# Patient Record
Sex: Male | Born: 1935 | Race: White | Hispanic: No | Marital: Married | Smoking: Former smoker
Health system: Southern US, Community
[De-identification: ages and names within clinical notes are randomized; demographics above are authoritative.]

## PROBLEM LIST (undated history)

## (undated) DIAGNOSIS — I509 Heart failure, unspecified: Secondary | ICD-10-CM

## (undated) DIAGNOSIS — C439 Malignant melanoma of skin, unspecified: Secondary | ICD-10-CM

## (undated) DIAGNOSIS — C801 Malignant (primary) neoplasm, unspecified: Secondary | ICD-10-CM

## (undated) DIAGNOSIS — M1990 Unspecified osteoarthritis, unspecified site: Secondary | ICD-10-CM

## (undated) DIAGNOSIS — I722 Aneurysm of renal artery: Secondary | ICD-10-CM

## (undated) DIAGNOSIS — N200 Calculus of kidney: Secondary | ICD-10-CM

## (undated) DIAGNOSIS — F32A Depression, unspecified: Secondary | ICD-10-CM

## (undated) DIAGNOSIS — N400 Enlarged prostate without lower urinary tract symptoms: Secondary | ICD-10-CM

## (undated) DIAGNOSIS — R0600 Dyspnea, unspecified: Secondary | ICD-10-CM

## (undated) DIAGNOSIS — E7800 Pure hypercholesterolemia, unspecified: Secondary | ICD-10-CM

## (undated) DIAGNOSIS — F329 Major depressive disorder, single episode, unspecified: Secondary | ICD-10-CM

## (undated) DIAGNOSIS — I714 Abdominal aortic aneurysm, without rupture, unspecified: Secondary | ICD-10-CM

## (undated) DIAGNOSIS — J189 Pneumonia, unspecified organism: Secondary | ICD-10-CM

## (undated) DIAGNOSIS — Z8719 Personal history of other diseases of the digestive system: Secondary | ICD-10-CM

## (undated) DIAGNOSIS — I10 Essential (primary) hypertension: Secondary | ICD-10-CM

## (undated) DIAGNOSIS — T7840XA Allergy, unspecified, initial encounter: Secondary | ICD-10-CM

## (undated) DIAGNOSIS — F419 Anxiety disorder, unspecified: Secondary | ICD-10-CM

## (undated) DIAGNOSIS — J449 Chronic obstructive pulmonary disease, unspecified: Secondary | ICD-10-CM

## (undated) DIAGNOSIS — K219 Gastro-esophageal reflux disease without esophagitis: Secondary | ICD-10-CM

## (undated) HISTORY — DX: Heart failure, unspecified: I50.9

## (undated) HISTORY — PX: ABDOMINAL AORTIC ANEURYSM REPAIR: SUR1152

## (undated) HISTORY — PX: HERNIA REPAIR: SHX51

## (undated) HISTORY — PX: EYE SURGERY: SHX253

## (undated) HISTORY — PX: KIDNEY STONE SURGERY: SHX686

## (undated) HISTORY — PX: BACK SURGERY: SHX140

---

## 2002-07-26 ENCOUNTER — Ambulatory Visit (HOSPITAL_COMMUNITY): Admission: RE | Admit: 2002-07-26 | Discharge: 2002-07-26 | Payer: Self-pay | Admitting: Neurosurgery

## 2002-12-08 ENCOUNTER — Inpatient Hospital Stay (HOSPITAL_COMMUNITY): Admission: RE | Admit: 2002-12-08 | Discharge: 2002-12-09 | Payer: Self-pay | Admitting: Neurosurgery

## 2003-09-08 ENCOUNTER — Ambulatory Visit (HOSPITAL_COMMUNITY): Admission: RE | Admit: 2003-09-08 | Discharge: 2003-09-08 | Payer: Self-pay | Admitting: Neurosurgery

## 2004-08-30 ENCOUNTER — Other Ambulatory Visit: Payer: Self-pay

## 2006-03-17 ENCOUNTER — Ambulatory Visit: Payer: Self-pay | Admitting: Gastroenterology

## 2006-06-04 ENCOUNTER — Emergency Department: Payer: Self-pay | Admitting: Emergency Medicine

## 2006-06-04 ENCOUNTER — Other Ambulatory Visit: Payer: Self-pay

## 2007-06-02 ENCOUNTER — Ambulatory Visit: Payer: Self-pay | Admitting: Family Medicine

## 2007-07-16 ENCOUNTER — Ambulatory Visit: Payer: Self-pay | Admitting: Family Medicine

## 2008-03-29 ENCOUNTER — Ambulatory Visit: Payer: Self-pay | Admitting: Family Medicine

## 2008-08-08 ENCOUNTER — Other Ambulatory Visit: Payer: Self-pay

## 2008-08-08 ENCOUNTER — Observation Stay: Payer: Self-pay | Admitting: Internal Medicine

## 2008-12-25 ENCOUNTER — Ambulatory Visit: Payer: Self-pay | Admitting: Unknown Physician Specialty

## 2009-02-07 ENCOUNTER — Encounter: Payer: Self-pay | Admitting: Neurology

## 2009-02-19 ENCOUNTER — Encounter: Payer: Self-pay | Admitting: Neurology

## 2009-09-05 ENCOUNTER — Ambulatory Visit: Payer: Self-pay | Admitting: Gastroenterology

## 2009-11-09 ENCOUNTER — Ambulatory Visit: Payer: Self-pay | Admitting: Gastroenterology

## 2009-11-19 ENCOUNTER — Ambulatory Visit: Payer: Self-pay | Admitting: Gastroenterology

## 2009-12-22 ENCOUNTER — Ambulatory Visit: Payer: Self-pay | Admitting: Internal Medicine

## 2010-01-04 ENCOUNTER — Ambulatory Visit: Payer: Self-pay | Admitting: Internal Medicine

## 2010-01-22 ENCOUNTER — Ambulatory Visit: Payer: Self-pay | Admitting: Internal Medicine

## 2010-02-19 ENCOUNTER — Ambulatory Visit: Payer: Self-pay | Admitting: Internal Medicine

## 2010-03-22 ENCOUNTER — Ambulatory Visit: Payer: Self-pay | Admitting: Internal Medicine

## 2010-04-11 ENCOUNTER — Ambulatory Visit: Payer: Self-pay | Admitting: Internal Medicine

## 2010-04-21 ENCOUNTER — Ambulatory Visit: Payer: Self-pay | Admitting: Internal Medicine

## 2010-06-03 ENCOUNTER — Ambulatory Visit: Payer: Self-pay | Admitting: Gastroenterology

## 2010-06-03 ENCOUNTER — Inpatient Hospital Stay: Payer: Self-pay | Admitting: Vascular Surgery

## 2010-11-28 ENCOUNTER — Ambulatory Visit: Payer: Self-pay | Admitting: Gastroenterology

## 2011-01-17 ENCOUNTER — Emergency Department: Payer: Self-pay | Admitting: Emergency Medicine

## 2011-05-23 ENCOUNTER — Ambulatory Visit: Payer: Self-pay | Admitting: Neurology

## 2011-06-10 ENCOUNTER — Ambulatory Visit: Payer: Self-pay | Admitting: Internal Medicine

## 2011-06-20 ENCOUNTER — Ambulatory Visit: Payer: Self-pay | Admitting: Internal Medicine

## 2011-06-23 ENCOUNTER — Ambulatory Visit: Payer: Self-pay | Admitting: Internal Medicine

## 2011-07-23 ENCOUNTER — Ambulatory Visit: Payer: Self-pay | Admitting: Internal Medicine

## 2011-08-27 ENCOUNTER — Ambulatory Visit: Payer: Self-pay | Admitting: Internal Medicine

## 2011-09-22 ENCOUNTER — Ambulatory Visit: Payer: Self-pay | Admitting: Internal Medicine

## 2011-11-20 ENCOUNTER — Other Ambulatory Visit: Payer: Self-pay | Admitting: Internal Medicine

## 2011-11-20 MED ORDER — HYDRALAZINE HCL 100 MG PO TABS: 100.0000 mg | ORAL_TABLET | Freq: Three times a day (TID) | ORAL | Status: DC

## 2011-11-27 ENCOUNTER — Other Ambulatory Visit: Payer: Self-pay | Admitting: Neurosurgery

## 2011-12-03 ENCOUNTER — Inpatient Hospital Stay (HOSPITAL_COMMUNITY): Admission: RE | Admit: 2011-12-03 | Payer: Medicare Other | Source: Ambulatory Visit

## 2011-12-08 ENCOUNTER — Encounter (HOSPITAL_COMMUNITY): Payer: Self-pay

## 2011-12-08 ENCOUNTER — Encounter (HOSPITAL_COMMUNITY)
Admission: RE | Admit: 2011-12-08 | Discharge: 2011-12-08 | Disposition: A | Discharge status: Home / Self Care | Payer: Medicare Other | Source: Ambulatory Visit | Attending: Neurosurgery | Admitting: Neurosurgery

## 2011-12-08 ENCOUNTER — Encounter (HOSPITAL_COMMUNITY): Payer: Self-pay | Admitting: Pharmacy Technician

## 2011-12-08 ENCOUNTER — Other Ambulatory Visit: Payer: Self-pay

## 2011-12-08 HISTORY — DX: Personal history of other diseases of the digestive system: Z87.19

## 2011-12-08 HISTORY — DX: Essential (primary) hypertension: I10

## 2011-12-08 HISTORY — DX: Gastro-esophageal reflux disease without esophagitis: K21.9

## 2011-12-08 HISTORY — DX: Unspecified osteoarthritis, unspecified site: M19.90

## 2011-12-08 HISTORY — DX: Malignant (primary) neoplasm, unspecified: C80.1

## 2011-12-08 HISTORY — DX: Benign prostatic hyperplasia without lower urinary tract symptoms: N40.0

## 2011-12-08 HISTORY — DX: Pure hypercholesterolemia, unspecified: E78.00

## 2011-12-08 LAB — BASIC METABOLIC PANEL
GFR calc Af Amer: 47 mL/min — ABNORMAL LOW (ref >90)
GFR calc non Af Amer: 41 mL/min — ABNORMAL LOW (ref >90)
Potassium: 6.1 mEq/L — ABNORMAL HIGH (ref 3.5–5.1)
Sodium: 137 mEq/L (ref 135–145)

## 2011-12-08 LAB — CBC
MCHC: 33.3 g/dL (ref 30.0–36.0)
RDW: 13.6 % (ref 11.5–15.5)
WBC: 9 10*3/uL (ref 4.0–10.5)

## 2011-12-08 LAB — SURGICAL PCR SCREEN
MRSA, PCR: NEGATIVE
Staphylococcus aureus: NEGATIVE

## 2011-12-08 NOTE — Progress Notes (Signed)
[facility]. Called for past stress test  "yrs ago". No current ekg

## 2011-12-08 NOTE — Pre-Procedure Instructions (Signed)
20 Gary Chang  12/08/2011   Your procedure is scheduled on:  12/10/11  Report to [facility] at 1045 AM.  Call this number if you have problems the morning of surgery: [phone]   Remember:   Do not eat food:After Midnight.  May have clear liquids: up to 4 Hours before arrival.  Clear liquids include soda, tea, black coffee, apple or grape juice, broth.  Take these medicines the morning of surgery with A SIP OF WATER:apresoline  Do not wear jewelry, make-up or nail polish.  Do not wear lotions, powders, or perfumes. You may wear deodorant.  Do not shave 48 hours prior to surgery.  Do not bring valuables to the hospital.  Contacts, dentures or bridgework may not be worn into surgery.  Leave suitcase in the car. After surgery it may be brought to your room.  For patients admitted to the hospital, checkout time is 11:00 AM the day of discharge.   Patients discharged the day of surgery will not be allowed to drive home.  Name and phone number of your driver: family  Special Instructions: CHG Shower Use Special Wash: 1/2 bottle night before surgery and 1/2 bottle morning of surgery.   Please read over the following fact sheets that you were given: Pain Booklet, MRSA Information and Surgical Site Infection Prevention

## 2011-12-08 NOTE — Pre-Procedure Instructions (Signed)
20 Gary Chang  12/08/2011   Your procedure is scheduled on:  12/10/11  Report to [facility] at 1045 AM.  Call this number if you have problems the morning of surgery: [phone]   Remember:   Do not eat food:After Midnight.  May have clear liquids: up to 4 Hours before arrival  Clear liquids include soda, tea, black coffee, apple or grape juice, broth.  Take these medicines the morning of surgery with A SIP OF WATER: apresoline,hydocodone,alprazolam,metroprolol   Do not wear jewelry, make-up or nail polish.  Do not wear lotions, powders, or perfumes. You may wear deodorant.  Do not shave 48 hours prior to surgery.  Do not bring valuables to the hospital.  Contacts, dentures or bridgework may not be worn into surgery.  Leave suitcase in the car. After surgery it may be brought to your room.  For patients admitted to the hospital, checkout time is 11:00 AM the day of discharge.   Patients discharged the day of surgery will not be allowed to drive home.  Name and phone number of your driver: family  Special Instructions: CHG Shower Use Special Wash: 1/2 bottle night before surgery and 1/2 bottle morning of surgery.   Please read over the following fact sheets that you were given: Pain Booklet, MRSA Information and Surgical Site Infection Prevention

## 2011-12-09 MED ORDER — CEFAZOLIN SODIUM-DEXTROSE 2-3 GM-% IV SOLR
2.0000 g | INTRAVENOUS | Status: DC
  Filled 2011-12-09: qty 50

## 2011-12-09 NOTE — Progress Notes (Signed)
No stress test available from [facility]. Unsure of when stress test was done

## 2011-12-09 NOTE — Progress Notes (Signed)
Consent verbage verified with Darl Pikes at Dr. Lovell Sheehan office

## 2011-12-10 ENCOUNTER — Inpatient Hospital Stay (HOSPITAL_COMMUNITY): Payer: Medicare Other | Admitting: *Deleted

## 2011-12-10 ENCOUNTER — Ambulatory Visit (HOSPITAL_COMMUNITY)
Admission: RE | Admit: 2011-12-10 | Discharge: 2011-12-11 | DRG: 491 | Disposition: A | Discharge status: Home / Self Care | Payer: Medicare Other | Source: Ambulatory Visit | Attending: Neurosurgery | Admitting: Neurosurgery

## 2011-12-10 ENCOUNTER — Encounter (HOSPITAL_COMMUNITY): Payer: Self-pay

## 2011-12-10 ENCOUNTER — Encounter (HOSPITAL_COMMUNITY)
Admission: RE | Disposition: A | Discharge status: Home / Self Care | Payer: Self-pay | Source: Ambulatory Visit | Attending: Neurosurgery

## 2011-12-10 ENCOUNTER — Encounter (HOSPITAL_COMMUNITY): Payer: Self-pay | Admitting: *Deleted

## 2011-12-10 ENCOUNTER — Inpatient Hospital Stay (HOSPITAL_COMMUNITY): Payer: Medicare Other

## 2011-12-10 DIAGNOSIS — M4714 Other spondylosis with myelopathy, thoracic region: Secondary | ICD-10-CM | POA: Insufficient documentation

## 2011-12-10 DIAGNOSIS — I10 Essential (primary) hypertension: Secondary | ICD-10-CM | POA: Insufficient documentation

## 2011-12-10 DIAGNOSIS — M4804 Spinal stenosis, thoracic region: Secondary | ICD-10-CM | POA: Insufficient documentation

## 2011-12-10 DIAGNOSIS — Z01818 Encounter for other preprocedural examination: Secondary | ICD-10-CM | POA: Insufficient documentation

## 2011-12-10 DIAGNOSIS — R269 Unspecified abnormalities of gait and mobility: Secondary | ICD-10-CM | POA: Insufficient documentation

## 2011-12-10 DIAGNOSIS — K219 Gastro-esophageal reflux disease without esophagitis: Secondary | ICD-10-CM | POA: Insufficient documentation

## 2011-12-10 DIAGNOSIS — Z01812 Encounter for preprocedural laboratory examination: Secondary | ICD-10-CM | POA: Insufficient documentation

## 2011-12-10 DIAGNOSIS — Z01810 Encounter for preprocedural cardiovascular examination: Secondary | ICD-10-CM | POA: Insufficient documentation

## 2011-12-10 DIAGNOSIS — E119 Type 2 diabetes mellitus without complications: Secondary | ICD-10-CM | POA: Insufficient documentation

## 2011-12-10 HISTORY — PX: LUMBAR LAMINECTOMY/DECOMPRESSION MICRODISCECTOMY: SHX5026

## 2011-12-10 LAB — GLUCOSE, CAPILLARY: Glucose-Capillary: 121 mg/dL — ABNORMAL HIGH (ref 70–99)

## 2011-12-10 LAB — POCT I-STAT 4, (NA,K, GLUC, HGB,HCT)
Glucose, Bld: 138 mg/dL — ABNORMAL HIGH (ref 70–99)
Potassium: 4.4 mEq/L (ref 3.5–5.1)

## 2011-12-10 SURGERY — LUMBAR LAMINECTOMY/DECOMPRESSION MICRODISCECTOMY
Anesthesia: General | Site: Spine Thoracic | Wound class: Clean

## 2011-12-10 MED ORDER — LOSARTAN POTASSIUM 50 MG PO TABS
100.0000 mg | ORAL_TABLET | ORAL | Status: DC
  Filled 2011-12-10 (×2): qty 2

## 2011-12-10 MED ORDER — HYDRALAZINE HCL 50 MG PO TABS
100.0000 mg | ORAL_TABLET | Freq: Three times a day (TID) | ORAL | Status: DC
  Administered 2011-12-10 – 2011-12-11 (×2): 100 mg via ORAL
  Filled 2011-12-10 (×4): qty 2

## 2011-12-10 MED ORDER — EPHEDRINE SULFATE 50 MG/ML IJ SOLN
INTRAMUSCULAR | Status: DC | PRN
  Administered 2011-12-10 (×3): 10 mg via INTRAVENOUS

## 2011-12-10 MED ORDER — SODIUM CHLORIDE 0.9 % IV SOLN
INTRAVENOUS | Status: AC
  Filled 2011-12-10: qty 500

## 2011-12-10 MED ORDER — ONDANSETRON HCL 4 MG/2ML IJ SOLN
INTRAMUSCULAR | Status: DC | PRN
  Administered 2011-12-10: 4 mg via INTRAVENOUS

## 2011-12-10 MED ORDER — BUPIVACAINE-EPINEPHRINE PF 0.5-1:200000 % IJ SOLN
INTRAMUSCULAR | Status: DC | PRN
  Administered 2011-12-10: 10 mL

## 2011-12-10 MED ORDER — OXYCODONE-ACETAMINOPHEN 5-325 MG PO TABS: 1.0000 | ORAL_TABLET | ORAL | Status: DC | PRN

## 2011-12-10 MED ORDER — NEOSTIGMINE METHYLSULFATE 1 MG/ML IJ SOLN
INTRAMUSCULAR | Status: DC | PRN
  Administered 2011-12-10: 3 mg via INTRAVENOUS

## 2011-12-10 MED ORDER — POTASSIUM CHLORIDE CRYS ER 20 MEQ PO TBCR
20.0000 meq | EXTENDED_RELEASE_TABLET | ORAL | Status: DC
  Filled 2011-12-10 (×2): qty 1

## 2011-12-10 MED ORDER — DOCUSATE SODIUM 100 MG PO CAPS
100.0000 mg | ORAL_CAPSULE | Freq: Two times a day (BID) | ORAL | Status: DC
  Administered 2011-12-10 – 2011-12-11 (×2): 100 mg via ORAL
  Filled 2011-12-10 (×2): qty 1

## 2011-12-10 MED ORDER — PHENOL 1.4 % MT LIQD: 1.0000 | OROMUCOSAL | Status: DC | PRN

## 2011-12-10 MED ORDER — ONDANSETRON HCL 4 MG/2ML IJ SOLN: 4.0000 mg | Freq: Once | INTRAMUSCULAR | Status: DC | PRN

## 2011-12-10 MED ORDER — MENTHOL 3 MG MT LOZG: 1.0000 | LOZENGE | OROMUCOSAL | Status: DC | PRN

## 2011-12-10 MED ORDER — LACTATED RINGERS IV SOLN: INTRAVENOUS | Status: DC

## 2011-12-10 MED ORDER — MEPERIDINE HCL 25 MG/ML IJ SOLN: 6.2500 mg | INTRAMUSCULAR | Status: DC | PRN

## 2011-12-10 MED ORDER — BUPIVACAINE LIPOSOME 1.3 % IJ SUSP
20.0000 mL | Freq: Once | INTRAMUSCULAR | Status: DC
  Filled 2011-12-10: qty 20

## 2011-12-10 MED ORDER — DIAZEPAM 5 MG PO TABS: 5.0000 mg | ORAL_TABLET | Freq: Four times a day (QID) | ORAL | Status: DC | PRN

## 2011-12-10 MED ORDER — CITALOPRAM HYDROBROMIDE 20 MG PO TABS: 1.5000 mg | ORAL_TABLET | Freq: Every day | ORAL | Status: DC

## 2011-12-10 MED ORDER — HYDROMORPHONE HCL PF 1 MG/ML IJ SOLN
INTRAMUSCULAR | Status: AC
  Filled 2011-12-10: qty 1

## 2011-12-10 MED ORDER — ZOLPIDEM TARTRATE 5 MG PO TABS: 10.0000 mg | ORAL_TABLET | Freq: Every evening | ORAL | Status: DC | PRN

## 2011-12-10 MED ORDER — BACITRACIN 50000 UNITS IM SOLR
INTRAMUSCULAR | Status: AC
  Filled 2011-12-10: qty 50000

## 2011-12-10 MED ORDER — ROCURONIUM BROMIDE 100 MG/10ML IV SOLN
INTRAVENOUS | Status: DC | PRN
  Administered 2011-12-10: 50 mg via INTRAVENOUS

## 2011-12-10 MED ORDER — ACETAMINOPHEN 325 MG PO TABS: 650.0000 mg | ORAL_TABLET | ORAL | Status: DC | PRN

## 2011-12-10 MED ORDER — PROPOFOL 10 MG/ML IV EMUL
INTRAVENOUS | Status: DC | PRN
  Administered 2011-12-10: 140 mg via INTRAVENOUS

## 2011-12-10 MED ORDER — MORPHINE SULFATE 4 MG/ML IJ SOLN: 1.0000 mg | INTRAMUSCULAR | Status: DC | PRN

## 2011-12-10 MED ORDER — SODIUM CHLORIDE 0.9 % IJ SOLN
3.0000 mL | Freq: Two times a day (BID) | INTRAMUSCULAR | Status: DC
  Administered 2011-12-10: 3 mL via INTRAVENOUS

## 2011-12-10 MED ORDER — BUPIVACAINE LIPOSOME 1.3 % IJ SUSP
INTRAMUSCULAR | Status: DC | PRN
  Administered 2011-12-10: 20 mL

## 2011-12-10 MED ORDER — INSULIN ASPART 100 UNIT/ML ~~LOC~~ SOLN
0.0000 [IU] | Freq: Three times a day (TID) | SUBCUTANEOUS | Status: DC
  Administered 2011-12-11: 2 [IU] via SUBCUTANEOUS
  Filled 2011-12-10: qty 3

## 2011-12-10 MED ORDER — ONDANSETRON HCL 4 MG/2ML IJ SOLN: 4.0000 mg | INTRAMUSCULAR | Status: DC | PRN

## 2011-12-10 MED ORDER — LACTATED RINGERS IV SOLN
INTRAVENOUS | Status: DC | PRN
  Administered 2011-12-10 (×2): via INTRAVENOUS

## 2011-12-10 MED ORDER — CEFAZOLIN SODIUM 1-5 GM-% IV SOLN
INTRAVENOUS | Status: DC | PRN
  Administered 2011-12-10: 2 g via INTRAVENOUS

## 2011-12-10 MED ORDER — FENTANYL CITRATE 0.05 MG/ML IJ SOLN
INTRAMUSCULAR | Status: DC | PRN
  Administered 2011-12-10 (×2): 100 ug via INTRAVENOUS
  Administered 2011-12-10: 50 ug via INTRAVENOUS

## 2011-12-10 MED ORDER — AMLODIPINE BESYLATE 10 MG PO TABS
10.0000 mg | ORAL_TABLET | Freq: Every day | ORAL | Status: DC
  Administered 2011-12-10: 10 mg via ORAL
  Filled 2011-12-10 (×2): qty 1

## 2011-12-10 MED ORDER — ALPRAZOLAM 0.25 MG PO TABS: 0.2500 mg | ORAL_TABLET | Freq: Every evening | ORAL | Status: DC | PRN

## 2011-12-10 MED ORDER — PREGABALIN 50 MG PO CAPS
100.0000 mg | ORAL_CAPSULE | Freq: Two times a day (BID) | ORAL | Status: DC
  Administered 2011-12-10 – 2011-12-11 (×2): 100 mg via ORAL
  Filled 2011-12-10 (×2): qty 2

## 2011-12-10 MED ORDER — INSULIN ASPART 100 UNIT/ML ~~LOC~~ SOLN: 0.0000 [IU] | Freq: Every day | SUBCUTANEOUS | Status: DC

## 2011-12-10 MED ORDER — MORPHINE SULFATE 2 MG/ML IJ SOLN: 0.0500 mg/kg | INTRAMUSCULAR | Status: DC | PRN

## 2011-12-10 MED ORDER — GLIPIZIDE ER 2.5 MG PO TB24
2.5000 mg | ORAL_TABLET | Freq: Every day | ORAL | Status: DC
  Administered 2011-12-10: 2.5 mg via ORAL
  Filled 2011-12-10 (×2): qty 1

## 2011-12-10 MED ORDER — CITALOPRAM HYDROBROMIDE 20 MG PO TABS
30.0000 mg | ORAL_TABLET | Freq: Every day | ORAL | Status: DC
  Administered 2011-12-10: 30 mg via ORAL
  Filled 2011-12-10 (×2): qty 1

## 2011-12-10 MED ORDER — SODIUM CHLORIDE 0.9 % IJ SOLN: 3.0000 mL | INTRAMUSCULAR | Status: DC | PRN

## 2011-12-10 MED ORDER — HYDROMORPHONE HCL PF 1 MG/ML IJ SOLN
0.2500 mg | INTRAMUSCULAR | Status: DC | PRN
Start: 2011-12-10 — End: 2011-12-10
  Administered 2011-12-10 (×2): 0.5 mg via INTRAVENOUS

## 2011-12-10 MED ORDER — ACETAMINOPHEN 650 MG RE SUPP: 650.0000 mg | RECTAL | Status: DC | PRN

## 2011-12-10 MED ORDER — METOPROLOL SUCCINATE ER 25 MG PO TB24
25.0000 mg | ORAL_TABLET | Freq: Every day | ORAL | Status: DC
  Filled 2011-12-10 (×2): qty 1

## 2011-12-10 MED ORDER — HYDROCODONE-ACETAMINOPHEN 5-325 MG PO TABS
1.0000 | ORAL_TABLET | ORAL | Status: DC | PRN
  Administered 2011-12-10 – 2011-12-11 (×4): 1 via ORAL
  Filled 2011-12-10 (×4): qty 1

## 2011-12-10 MED ORDER — GLYCOPYRROLATE 0.2 MG/ML IJ SOLN
INTRAMUSCULAR | Status: DC | PRN
  Administered 2011-12-10: .4 mg via INTRAVENOUS

## 2011-12-10 MED ORDER — CEFAZOLIN SODIUM 1-5 GM-% IV SOLN
1.0000 g | Freq: Three times a day (TID) | INTRAVENOUS | Status: AC
  Administered 2011-12-10 – 2011-12-11 (×2): 1 g via INTRAVENOUS
  Filled 2011-12-10 (×2): qty 50

## 2011-12-10 MED ORDER — MIDAZOLAM HCL 5 MG/5ML IJ SOLN
INTRAMUSCULAR | Status: DC | PRN
  Administered 2011-12-10: 2 mg via INTRAVENOUS

## 2011-12-10 SURGICAL SUPPLY — 57 items
BAG DECANTER FOR FLEXI CONT (MISCELLANEOUS) ×2 IMPLANT
BENZOIN TINCTURE PRP APPL 2/3 (GAUZE/BANDAGES/DRESSINGS) ×2 IMPLANT
BIT DRILL NEURO 2X3.1 SFT TUCH (MISCELLANEOUS) ×1 IMPLANT
BLADE SURG ROTATE 9660 (MISCELLANEOUS) IMPLANT
BRUSH SCRUB EZ PLAIN DRY (MISCELLANEOUS) ×2 IMPLANT
BUR ACORN 6.0 (BURR) ×2 IMPLANT
BUR MATCHSTICK NEURO 3.0 LAGG (BURR) ×2 IMPLANT
CANISTER SUCTION 2500CC (MISCELLANEOUS) ×2 IMPLANT
CLOTH BEACON ORANGE TIMEOUT ST (SAFETY) ×2 IMPLANT
CONT SPEC 4OZ CLIKSEAL STRL BL (MISCELLANEOUS) ×2 IMPLANT
DRAPE LAPAROTOMY 100X72X124 (DRAPES) ×2 IMPLANT
DRAPE MICROSCOPE LEICA (MISCELLANEOUS) ×4 IMPLANT
DRAPE POUCH INSTRU U-SHP 10X18 (DRAPES) ×2 IMPLANT
DRAPE PROXIMA HALF (DRAPES) ×2 IMPLANT
DRAPE SURG 17X23 STRL (DRAPES) ×8 IMPLANT
DRILL NEURO 2X3.1 SOFT TOUCH (MISCELLANEOUS) ×2
ELECT BLADE 4.0 EZ CLEAN MEGAD (MISCELLANEOUS) ×2
ELECT REM PT RETURN 9FT ADLT (ELECTROSURGICAL) ×2
ELECTRODE BLDE 4.0 EZ CLN MEGD (MISCELLANEOUS) ×1 IMPLANT
ELECTRODE REM PT RTRN 9FT ADLT (ELECTROSURGICAL) ×1 IMPLANT
GAUZE SPONGE 4X4 16PLY XRAY LF (GAUZE/BANDAGES/DRESSINGS) IMPLANT
GLOVE BIO SURGEON STRL SZ8.5 (GLOVE) ×2 IMPLANT
GLOVE BIOGEL M 8.0 STRL (GLOVE) ×2 IMPLANT
GLOVE BIOGEL PI IND STRL 7.0 (GLOVE) ×1 IMPLANT
GLOVE BIOGEL PI INDICATOR 7.0 (GLOVE) ×1
GLOVE EXAM NITRILE LRG STRL (GLOVE) IMPLANT
GLOVE EXAM NITRILE MD LF STRL (GLOVE) IMPLANT
GLOVE EXAM NITRILE XL STR (GLOVE) IMPLANT
GLOVE EXAM NITRILE XS STR PU (GLOVE) IMPLANT
GLOVE SS BIOGEL STRL SZ 8 (GLOVE) ×1 IMPLANT
GLOVE SUPERSENSE BIOGEL SZ 8 (GLOVE) ×1
GLOVE SURG SS PI 6.5 STRL IVOR (GLOVE) ×4 IMPLANT
GOWN BRE IMP SLV AUR LG STRL (GOWN DISPOSABLE) ×2 IMPLANT
GOWN BRE IMP SLV AUR XL STRL (GOWN DISPOSABLE) ×6 IMPLANT
GOWN STRL REIN 2XL LVL4 (GOWN DISPOSABLE) IMPLANT
KIT BASIN OR (CUSTOM PROCEDURE TRAY) ×2 IMPLANT
KIT ROOM TURNOVER OR (KITS) ×2 IMPLANT
MAYFIELD SKULL PINS ×2 IMPLANT
NEEDLE HYPO 21X1.5 SAFETY (NEEDLE) IMPLANT
NEEDLE HYPO 22GX1.5 SAFETY (NEEDLE) ×2 IMPLANT
NS IRRIG 1000ML POUR BTL (IV SOLUTION) ×2 IMPLANT
PACK LAMINECTOMY NEURO (CUSTOM PROCEDURE TRAY) ×2 IMPLANT
PAD ARMBOARD 7.5X6 YLW CONV (MISCELLANEOUS) ×6 IMPLANT
PATTIES SURGICAL .5 X1 (DISPOSABLE) IMPLANT
PIN MAYFIELD SKULL DISP (PIN) ×2 IMPLANT
RUBBERBAND STERILE (MISCELLANEOUS) ×8 IMPLANT
SPONGE GAUZE 4X4 12PLY (GAUZE/BANDAGES/DRESSINGS) ×2 IMPLANT
SPONGE SURGIFOAM ABS GEL SZ50 (HEMOSTASIS) ×2 IMPLANT
STRIP CLOSURE SKIN 1/2X4 (GAUZE/BANDAGES/DRESSINGS) ×2 IMPLANT
SUT VIC AB 1 CT1 18XBRD ANBCTR (SUTURE) ×2 IMPLANT
SUT VIC AB 1 CT1 8-18 (SUTURE) ×2
SUT VIC AB 2-0 CP2 18 (SUTURE) ×4 IMPLANT
SYR 20CC LL (SYRINGE) ×2 IMPLANT
SYR 20ML ECCENTRIC (SYRINGE) ×2 IMPLANT
TOWEL OR 17X24 6PK STRL BLUE (TOWEL DISPOSABLE) ×2 IMPLANT
TOWEL OR 17X26 10 PK STRL BLUE (TOWEL DISPOSABLE) ×2 IMPLANT
WATER STERILE IRR 1000ML POUR (IV SOLUTION) ×2 IMPLANT

## 2011-12-10 NOTE — Op Note (Signed)
Brief history: The patient is an 77 year old white male who has presented with a thoracic myelopathy. He was worked up with a cervical MRI which demonstrated significant stenosis at T1-2 and T2-3. I discussed the situation with the patient and his wife. We discussed the various treatment options including surgery. The patient has weighed the risks, benefits, and alternatives to surgery decided proceed with a thoracic laminectomy.  Preoperative diagnosis: T1-2 and T2-3 spinal stenosis, thoracic myelopathy, thoracic pain.  Postoperative diagnosis: The same  Procedure: T1-2 and T2-3 laminectomy using microdissection  Surgeon: Dr. Delma Officer  Asst.: Dr. Hilda Lias  Anesthesia: Gen. endotracheal  Estimated blood loss: 100 cc  Drains: None  Complications: None  Description of procedure: The patient was brought to the operating room by the anesthesia team. General endotracheal anesthesia was induced. The patient's calvarium was supported in a [place] 3 point headrest. The patient was turned to the prone position on the chest rolls. The patient's cervicothoracic region was then prepared with Betadine scrub and Betadine solution. Sterile drapes were applied.  I then injected the area to be incised with Marcaine with epinephrine solution. I then used a scalpel to make a linear midline incision over the T1-2 and T2-3 intervertebral disc space. I then used electrocautery to perform a bilateralsubperiosteal dissection exposing the spinous process and lamina from approximately C5 to T3 . We obtained intraoperative radiograph to confirm our location. I then inserted the [facility] retractor for exposure. I used Leksell rongeurs to remove the spinous process of T1 and T2.  We then brought the operative microscope into the field. Under its magnification and illumination we completed the microdissection. I used a high-speed drill to perform a laminotomy at T1 and T2.. I then used a Kerrison punches to  widen the laminotomy and removed the ligamentum flavum at C7-T1, T1-2, and T2-3 decompressing the thecal sac at these levels.   We then obtained hemostasis using bipolar electrocautery. We irrigated the wound out with bacitracin solution. We then removed the retractor. We then reapproximated the patient's cervicothoracic fascia with interrupted #1 Vicryl suture. We then reapproximated the patient's subcutaneous tissue with interrupted 2-0 Vicrylsuture. We then reapproximated patient's skin with Steri-Strips and benzoin. The was then coated with bacitracin ointment. The drapes were removed. The patient was subsequently returned to the supine position where they were extubated by the anesthesia team and I removed the [place] 3 point headrest. The patient was then transported to the postanesthesia care unit in stable condition. All sponge instrument and needle counts were correct at the end of this case.

## 2011-12-10 NOTE — Anesthesia Postprocedure Evaluation (Signed)
  Anesthesia Post-op Note  Patient: Gary Chang  Procedure(s) Performed:  LUMBAR LAMINECTOMY/DECOMPRESSION MICRODISCECTOMY - Thoracic One-Two Laminectomy  Patient Location: PACU  Anesthesia Type: General  Level of Consciousness: awake and alert   Airway and Oxygen Therapy: Patient Spontanous Breathing and Patient connected to nasal cannula oxygen  Post-op Pain: mild  Post-op Assessment: Post-op Vital signs reviewed, Patient's Cardiovascular Status Stable, Respiratory Function Stable, Patent Airway, No signs of Nausea or vomiting and Pain level controlled  Post-op Vital Signs: Reviewed and stable  Complications: No apparent anesthesia complications

## 2011-12-10 NOTE — Progress Notes (Signed)
Spoke with Ramon Dredge, reported BMET, new order released for I-Stat

## 2011-12-10 NOTE — Preoperative (Addendum)
Beta Blockers   Reason not to administer Beta Blockers:Not Applicable    Took metoprolol 12/19 1100

## 2011-12-10 NOTE — Progress Notes (Signed)
  Subjective:  The patient is alert and pleasant. He looks well. He has no complaints.  Objective: Vital signs in last 24 hours: Temp:  [97.7 F (36.5 C)-97.9 F (36.6 C)] 97.7 F (36.5 C) (12/19 1715) Pulse Rate:  [67] 67  (12/19 1048) Resp:  [18] 18  (12/19 1048) BP: (135-144)/(62-78) 144/62 mmHg (12/19 1715) SpO2:  [97 %] 97 % (12/19 1048)  Intake/Output from previous day:   Intake/Output this shift: Total I/O In: 1900 [I.V.:1900] Out: 100 [Blood:100]  Physical exam the patient is moving his lower extremities well. His dressing is clean and dry.  Lab Results:  Basename 12/10/11 1101 12/08/11 1546  WBC -- 9.0  HGB 16.0 15.6  HCT 47.0 46.8  PLT -- 185   BMET  Basename 12/10/11 1101 12/08/11 1546  NA 140 137  K 4.4 6.1*  CL -- 103  CO2 -- 28  GLUCOSE 138* 79  BUN -- 18  CREATININE -- 1.54*  CALCIUM -- 9.3    Studies/Results: Dg Thoracic Spine 1 View  12/10/2011  *RADIOLOGY REPORT*  Clinical Data: T1-2 laminectomy  THORACIC SPINE - 1 VIEW  Comparison: None.  Findings: Lateral view of the cervical spine.  Limited visualization of C5 and below.  Surgical instrument overlies the spinous process of C5 and projects over the spinal canal posterior to the C5-6 level.  IMPRESSION: C5-6 localized from a posterior surgical approach.  Original Report Authenticated By: Camelia Phenes, M.D.   Dg Thoracic Spine 1 View  12/10/2011  *RADIOLOGY REPORT*  Clinical Data: T1-2 laminectomy  THORACIC SPINE - 1 VIEW  Comparison: None.  Findings: Lateral view of the cervical spine was obtained with limited visualization of C5 and below.  Anterior plate and screws at C4-5 and C5-6.  Posterior surgical approach.  Surgical instrument projects between the spinous processes of C3 and C4.  IMPRESSION: C3-4 localized in the posterior surgical approach.  Original Report Authenticated By: Camelia Phenes, M.D.   Dg Thoracic Spine 1 View  12/10/2011  *RADIOLOGY REPORT*  Clinical Data: T1-2  thoracic laminectomy  THORACIC SPINE - 1 VIEW  Comparison: 12/10/2011  Findings: Lateral view of the cervical spine was obtained at 1635 hours.  Suboptimal visualization of the lower cervical spine due to overlying shoulders.  The thoracic spine is not visualized.  Anterior plate and screws at C4-5 and C5-6.  C6 is not adequately visualized.  Posterior tissue spreaders are present.  Surgical instrument directed between spinous processes of C5 and C6.  IMPRESSION: C5-6 localized from a posterior approach.  Original Report Authenticated By: Camelia Phenes, M.D.    Assessment/Plan: The patient is doing well.  LOS: 0 days     Vaniyah Lansky D 12/10/2011, 5:38 PM

## 2011-12-10 NOTE — Plan of Care (Signed)
Problem: Consults Goal: Diagnosis - Spinal Surgery Outcome: Completed/Met Date Met:  12/10/11 Lumbar Laminectomy (Complex)     

## 2011-12-10 NOTE — H&P (Signed)
Subjective: Patient is an 77 year old white male who complains of thoracic pain and unsteady gait. He has had trouble with ambulation and states that times his legs jump. He was worked up with cervical and thoracic MRI demonstrated significant spinal stenosis at T1-2 and T2-3. I discussed the various treatment options the patient has decided proceed with surgery.   Past Medical History  Diagnosis Date  . High cholesterol   . Diabetes mellitus     tingling in feet  . Arthritis   . GERD (gastroesophageal reflux disease)   . H/O hiatal hernia   . Enlarged prostate     laser surgery  . Hypertension     pcp  dr Fayrene Fearing [place]  [place]  . Cancer     skin ca removed    Past Surgical History  Procedure Date  . Hernia repair   . Kidney stone surgery   . Abdominal aortic aneurysm repair   . Back surgery     lumbar  ,cervical surgeries    Allergies  Allergen Reactions  . Statins Other (See Comments)    "Numbness and tingling"    History  Substance Use Topics  . Smoking status: Current Everyday Smoker    Types: Pipe  . Smokeless tobacco: Not on file  . Alcohol Use: Yes     occ    Family History  Problem Relation Age of Onset  . Anesthesia problems Neg Hx   . Hypotension Neg Hx   . Malignant hyperthermia Neg Hx   . Pseudochol deficiency Neg Hx    Prior to Admission medications   Medication Sig Start Date End Date Taking? Authorizing Provider  ALPRAZolam (XANAX) 0.25 MG tablet Take 0.25 mg by mouth at bedtime as needed. For panic attacks    Yes Historical Provider, MD  amLODipine (NORVASC) 10 MG tablet Take 10 mg by mouth every evening.     Yes Historical Provider, MD  aspirin EC 81 MG tablet Take 81 mg by mouth at bedtime.     Yes Historical Provider, MD  citalopram (CELEXA) 20 MG tablet Take 1.5 mg by mouth at bedtime.     Yes Historical Provider, MD  glipiZIDE (GLUCOTROL XL) 2.5 MG 24 hr tablet Take 2.5 mg by mouth at bedtime.     Yes Historical Provider, MD    hydrALAZINE (APRESOLINE) 100 MG tablet Take 1 tablet (100 mg total) by mouth 3 (three) times daily. 11/20/11 11/19/12 Yes Duncan Dull, MD  HYDROcodone-acetaminophen (VICODIN) 5-500 MG per tablet Take 1 tablet by mouth 2 (two) times daily as needed. For pain    Yes Historical Provider, MD  losartan (COZAAR) 100 MG tablet Take 100 mg by mouth every morning.     Yes Historical Provider, MD  metoprolol succinate (TOPROL-XL) 25 MG 24 hr tablet Take 25 mg by mouth at bedtime.     Yes Historical Provider, MD  potassium chloride SA (K-DUR,KLOR-CON) 20 MEQ tablet Take 20 mEq by mouth every morning.     Yes Historical Provider, MD  pregabalin (LYRICA) 100 MG capsule Take 100 mg by mouth 2 (two) times daily.     Yes Historical Provider, MD  Cholecalciferol (VITAMIN D3 SUPER STRENGTH) 2000 UNITS TABS Take 2,000 Units by mouth daily.      Historical Provider, MD     Review of Systems  Positive ROS: Negative except as above2  All other systems have been reviewed and were otherwise negative with the exception of those mentioned in the HPI and as above.  Objective: Vital signs in last 24 hours: Temp:  [97.9 F (36.6 C)] 97.9 F (36.6 C) (12/19 1048) Pulse Rate:  [67] 67  (12/19 1048) Resp:  [18] 18  (12/19 1048) BP: (135)/(78) 135/78 mmHg (12/19 1048) SpO2:  [97 %] 97 % (12/19 1048)  General Appearance: Alert, cooperative, no distress, appears stated age Head: Normocephalic, without obvious abnormality, atraumatic Eyes: PERRL, conjunctiva/corneas clear, EOM's intact, fundi benign, both eyes      Ears: Normal TM's and external ear canals, both ears Throat: Lips, mucosa, and tongue normal; teeth and gums normal Neck: Supple, symmetrical, trachea midline, no adenopathy; thyroid: No enlargement/tenderness/nodules; no carotid bruit or JVD Back: Symmetric, no curvature, ROM normal, no CVA tenderness Lungs: Clear to auscultation bilaterally, respirations unlabored Heart: Regular rate and rhythm, S1 and  S2 normal, no murmur, rub or gallop Abdomen: Soft, non-tender, bowel sounds active all four quadrants, no masses, no organomegaly Extremities: Extremities normal, atraumatic, no cyanosis or edema Pulses: 2+ and symmetric all extremities Skin: Skin color, texture, turgor normal, no rashes or lesions  NEUROLOGIC:   Mental status: alert and oriented, no aphasia, good attention span, Fund of knowledge/ memory ok Motor Exam - grossly normal Sensory Exam - grossly normal Reflexes: 2/4 his bilateral biceps and triceps, 2+/4 in his right quadriceps gastrocnemius, 3/4 his left quadricep and 2+4 in his left gastrocnemius he has ankle clonus on the left. Coordination - grossly normal Gait - grossly normal Balance - grossly normal Cranial Nerves: I: smell Not tested  II: visual acuity  OS: Normal    OD: Normal   II: visual fields Full to confrontation  II: pupils Equal, round, reactive to light  III,VII: ptosis None  III,IV,VI: extraocular muscles  Full ROM  V: mastication Normal  V: facial light touch sensation  Normal  V,VII: corneal reflex  Present  VII: facial muscle function - upper  Normal  VII: facial muscle function - lower Normal  VIII: hearing Not tested  IX: soft palate elevation  Normal  IX,X: gag reflex Present  XI: trapezius strength  5/5  XI: sternocleidomastoid strength 5/5  XI: neck flexion strength  5/5  XII: tongue strength  Normal    Data Review Lab Results  Component Value Date   WBC 9.0 12/08/2011   HGB 16.0 12/10/2011   HCT 47.0 12/10/2011   MCV 87.5 12/08/2011   PLT 185 12/08/2011   Lab Results  Component Value Date   NA 140 12/10/2011   K 4.4 12/10/2011   CL 103 12/08/2011   CO2 28 12/08/2011   BUN 18 12/08/2011   CREATININE 1.54* 12/08/2011   GLUCOSE 138* 12/10/2011   No results found for this basename: INR, PROTIME   Imaging studies: I reviewed the patient's cervical MRI performed 05/23/2011 at [facility]. It demonstrates the patient has a  decompression and fusion at C4-5, C5-6 and C6-7. These levels look good. Patient has a grade 1 spondylolisthesis at C7-T1 without significant spinal stenosis. Patient has multifactorial spondylosis and stenosis at T1-2 and T2-3.  Assessment/Plan: T1-2 and T2-3 spinal stenosis, thoracic myelopathy: I discussed situation with the patient and his wife. I reviewed the MR scan with them and pointed out the abnormalities. We have discussed the various treatment options including surgery. I described the surgical option of T1-2 and T2-3 laminectomy. I discussed the risks, benefits, alternatives and the likelihood of achieving our goals with the surgery. I have answered all their questions. The patient was to proceed with surgery.   Cristi Loron 12/10/2011  2:37 PM

## 2011-12-10 NOTE — Transfer of Care (Signed)
Immediate Anesthesia Transfer of Care Note  Patient: Gary Chang  Procedure(s) Performed:  LUMBAR LAMINECTOMY/DECOMPRESSION MICRODISCECTOMY - Thoracic One-Two Laminectomy  Patient Location: PACU  Anesthesia Type: General  Level of Consciousness: awake, alert , oriented and patient cooperative  Airway & Oxygen Therapy: Patient Spontanous Breathing and Patient connected to nasal cannula oxygen  Post-op Assessment: Report given to PACU RN, Post -op Vital signs reviewed and stable and Patient moving all extremities  Post vital signs: Reviewed and stable  Complications: No apparent anesthesia complications

## 2011-12-10 NOTE — Anesthesia Preprocedure Evaluation (Addendum)
Anesthesia Evaluation  Patient identified by MRN, date of birth, ID band Patient awake    Reviewed: Allergy & Precautions, H&P , NPO status , Patient's Chart, lab work & pertinent test results  Airway Mallampati: I TM Distance: >3 FB Neck ROM: Full    Dental No notable dental hx. (+) Teeth Intact and Dental Advisory Given   Pulmonary    Pulmonary exam normal       Cardiovascular hypertension, Pt. on medications Regular Normal    Neuro/Psych    GI/Hepatic hiatal hernia, GERD-  Medicated and Controlled,  Endo/Other  Diabetes mellitus-, Well Controlled, Type 2, Oral Hypoglycemic Agents  Renal/GU      Musculoskeletal   Abdominal   Peds  Hematology   Anesthesia Other Findings   Reproductive/Obstetrics                          Anesthesia Physical Anesthesia Plan  ASA: III  Anesthesia Plan: General   Post-op Pain Management:    Induction: Intravenous  Airway Management Planned: Oral ETT  Additional Equipment:   Intra-op Plan:   Post-operative Plan: Extubation in OR  Informed Consent: I have reviewed the patients History and Physical, chart, labs and discussed the procedure including the risks, benefits and alternatives for the proposed anesthesia with the patient or authorized representative who has indicated his/her understanding and acceptance.   Dental advisory given  Plan Discussed with: CRNA, Anesthesiologist and Surgeon  Anesthesia Plan Comments:         Anesthesia Quick Evaluation

## 2011-12-11 ENCOUNTER — Encounter (HOSPITAL_COMMUNITY): Payer: Self-pay | Admitting: Neurosurgery

## 2011-12-11 MED ORDER — POTASSIUM CHLORIDE CRYS ER 20 MEQ PO TBCR
20.0000 meq | EXTENDED_RELEASE_TABLET | ORAL | Status: DC
  Administered 2011-12-11: 20 meq via ORAL
  Filled 2011-12-11 (×2): qty 1

## 2011-12-11 MED ORDER — LOSARTAN POTASSIUM 50 MG PO TABS
100.0000 mg | ORAL_TABLET | ORAL | Status: DC
  Administered 2011-12-11: 100 mg via ORAL
  Filled 2011-12-11 (×2): qty 2

## 2011-12-11 MED ORDER — DSS 100 MG PO CAPS: 100.0000 mg | ORAL_CAPSULE | Freq: Two times a day (BID) | ORAL | Status: AC

## 2011-12-11 MED ORDER — CYCLOBENZAPRINE HCL 5 MG PO TABS: 5.0000 mg | ORAL_TABLET | Freq: Three times a day (TID) | ORAL | Status: AC | PRN

## 2011-12-11 MED ORDER — OXYCODONE-ACETAMINOPHEN 5-325 MG PO TABS: 1.0000 | ORAL_TABLET | ORAL | Status: AC | PRN

## 2011-12-11 NOTE — Progress Notes (Signed)
Patient ID: Gary Chang, male   DOB: [birth year], 77 y.o.   MRN: [id number] Subjective:  The patient is alert and pleasant. He looks well. He wants to go home.  Objective: Vital signs in last 24 hours: Temp:  [97.6 F (36.4 C)-97.9 F (36.6 C)] 97.9 F (36.6 C) (12/20 0400) Pulse Rate:  [67-95] 92  (12/20 0400) Resp:  [9-21] 18  (12/20 0400) BP: (106-151)/(49-79) 106/49 mmHg (12/20 0400) SpO2:  [91 %-98 %] 91 % (12/20 0400)  Intake/Output from previous day: 12/19 0701 - 12/20 0700 In: 3350 [P.O.:480; I.V.:2770; IV Piggyback:100] Out: 100 [Blood:100] Intake/Output this shift:    Physical exam the patient's dressing is clean and dry. His lower strumming strength is normal.  Lab Results:  Basename 12/10/11 1101 12/08/11 1546  WBC -- 9.0  HGB 16.0 15.6  HCT 47.0 46.8  PLT -- 185   BMET  Basename 12/10/11 1101 12/08/11 1546  NA 140 137  K 4.4 6.1*  CL -- 103  CO2 -- 28  GLUCOSE 138* 79  BUN -- 18  CREATININE -- 1.54*  CALCIUM -- 9.3    Studies/Results: Dg Thoracic Spine 1 View  12/10/2011  *RADIOLOGY REPORT*  Clinical Data: T1-2 laminectomy  THORACIC SPINE - 1 VIEW  Comparison: None.  Findings: Lateral view of the cervical spine.  Limited visualization of C5 and below.  Surgical instrument overlies the spinous process of C5 and projects over the spinal canal posterior to the C5-6 level.  IMPRESSION: C5-6 localized from a posterior surgical approach.  Original Report Authenticated By: Camelia Phenes, M.D.   Dg Thoracic Spine 1 View  12/10/2011  *RADIOLOGY REPORT*  Clinical Data: T1-2 laminectomy  THORACIC SPINE - 1 VIEW  Comparison: None.  Findings: Lateral view of the cervical spine was obtained with limited visualization of C5 and below.  Anterior plate and screws at C4-5 and C5-6.  Posterior surgical approach.  Surgical instrument projects between the spinous processes of C3 and C4.  IMPRESSION: C3-4 localized in the posterior surgical approach.  Original Report  Authenticated By: Camelia Phenes, M.D.   Dg Thoracic Spine 1 View  12/10/2011  *RADIOLOGY REPORT*  Clinical Data: T1-2 thoracic laminectomy  THORACIC SPINE - 1 VIEW  Comparison: 12/10/2011  Findings: Lateral view of the cervical spine was obtained at 1635 hours.  Suboptimal visualization of the lower cervical spine due to overlying shoulders.  The thoracic spine is not visualized.  Anterior plate and screws at C4-5 and C5-6.  C6 is not adequately visualized.  Posterior tissue spreaders are present.  Surgical instrument directed between spinous processes of C5 and C6.  IMPRESSION: C5-6 localized from a posterior approach.  Original Report Authenticated By: Camelia Phenes, M.D.    Assessment/Plan: Postop day 1: The patient is doing well and wants to go home. I will discharge him. I have given him oral and written discharge instructions. I have answered all his questions.  LOS: 1 day     Zaya Kessenich D 12/11/2011, 7:39 AM

## 2011-12-11 NOTE — Discharge Summary (Signed)
Physician Discharge Summary  Patient ID: Gary Chang MRN: [id number] DOB/AGE: February 24, 1928 77 y.o.  Admit date: 12/10/2011 Discharge date: 12/11/2011  Admission Diagnoses:  Discharge Diagnoses:  Active Problems:  * No active hospital problems. *    Discharged Condition: good  Hospital Course: I admitted the patient to [facility] on 12/10/2011. On that day I performed a T1 and T2 laminectomy. The surgery went well.  The patient's postoperative course was unremarkable. The patient requested discharge to home on postop day #1. He was discharged.  Consults: None Significant Diagnostic Studies: None Treatments: T1 and T2 laminectomy. Discharge Exam: Blood pressure 106/49, pulse 92, temperature 97.9 F (36.6 C), temperature source Oral, resp. rate 18, SpO2 91.00%. The patient is alert and oriented. His motor strength is normal. His dressing is dry.  Disposition: Home  Discharge Orders    Future Orders Please Complete By Expires   Diet - low sodium heart healthy      Increase activity slowly      Discharge instructions      Comments:   Call [phone] for a followup appointment. The patient was given oral and written discharge instructions. His questions were answered.   Remove dressing in 48 hours      Call MD for:  temperature >100.4      Call MD for:  persistant nausea and vomiting      Call MD for:  severe uncontrolled pain      Call MD for:  redness, tenderness, or signs of infection (pain, swelling, redness, odor or green/yellow discharge around incision site)      Call MD for:  difficulty breathing, headache or visual disturbances      Call MD for:  hives      Call MD for:  persistant dizziness or light-headedness      Call MD for:  extreme fatigue        Current Discharge Medication List    START taking these medications   Details  cyclobenzaprine (FLEXERIL) 5 MG tablet Take 1 tablet (5 mg total) by mouth 3 (three) times daily as needed for  muscle spasms. Qty: 50 tablet, Refills: 1    docusate sodium 100 MG CAPS Take 100 mg by mouth 2 (two) times daily. Qty: 60 capsule, Refills: 1    oxyCODONE-acetaminophen (PERCOCET) 5-325 MG per tablet Take 1-2 tablets by mouth every 4 (four) hours as needed. Qty: 100 tablet, Refills: 0      CONTINUE these medications which have NOT CHANGED   Details  ALPRAZolam (XANAX) 0.25 MG tablet Take 0.25 mg by mouth at bedtime as needed. For panic attacks     amLODipine (NORVASC) 10 MG tablet Take 10 mg by mouth every evening.      aspirin EC 81 MG tablet Take 81 mg by mouth at bedtime.      citalopram (CELEXA) 20 MG tablet Take 1.5 mg by mouth at bedtime.      glipiZIDE (GLUCOTROL XL) 2.5 MG 24 hr tablet Take 2.5 mg by mouth at bedtime.      hydrALAZINE (APRESOLINE) 100 MG tablet Take 1 tablet (100 mg total) by mouth 3 (three) times daily. Qty: 90 tablet, Refills: 1    losartan (COZAAR) 100 MG tablet Take 100 mg by mouth every morning.      metoprolol succinate (TOPROL-XL) 25 MG 24 hr tablet Take 25 mg by mouth at bedtime.      potassium chloride SA (K-DUR,KLOR-CON) 20 MEQ tablet Take 20 mEq by mouth every  morning.      pregabalin (LYRICA) 100 MG capsule Take 100 mg by mouth 2 (two) times daily.      Cholecalciferol (VITAMIN D3 SUPER STRENGTH) 2000 UNITS TABS Take 2,000 Units by mouth daily.        STOP taking these medications     HYDROcodone-acetaminophen (VICODIN) 5-500 MG per tablet          Signed: Areatha Kalata D 12/11/2011, 7:44 AM

## 2011-12-13 ENCOUNTER — Emergency Department: Payer: Self-pay | Admitting: Emergency Medicine

## 2012-01-23 ENCOUNTER — Observation Stay: Payer: Self-pay | Admitting: Internal Medicine

## 2012-01-23 LAB — CBC
HGB: 15.2 g/dL (ref 13.0–18.0)
MCH: 27.9 pg (ref 26.0–34.0)
MCHC: 32.6 g/dL (ref 32.0–36.0)
Platelet: 178 10*3/uL (ref 150–440)
RBC: 5.43 10*6/uL (ref 4.40–5.90)

## 2012-01-23 LAB — COMPREHENSIVE METABOLIC PANEL
Albumin: 4.1 g/dL (ref 3.4–5.0)
BUN: 13 mg/dL (ref 7–18)
EGFR (African American): >60
Glucose: 116 mg/dL — ABNORMAL HIGH (ref 65–99)
SGOT(AST): 21 U/L (ref 15–37)
SGPT (ALT): 21 U/L
Total Protein: 7.3 g/dL (ref 6.4–8.2)

## 2012-01-23 LAB — CK TOTAL AND CKMB (NOT AT ARMC)
CK, Total: 114 U/L (ref 35–232)
CK, Total: 96 U/L (ref 35–232)
CK-MB: 2.1 ng/mL (ref 0.5–3.6)
CK-MB: 1.7 ng/mL (ref 0.5–3.6)

## 2012-01-23 LAB — PROTIME-INR: Prothrombin Time: 13.2 secs (ref 11.5–14.7)

## 2012-01-24 LAB — CK TOTAL AND CKMB (NOT AT ARMC)
CK, Total: 89 U/L (ref 35–232)
CK-MB: 1.7 ng/mL (ref 0.5–3.6)

## 2012-01-24 LAB — TROPONIN I: Troponin-I: <0.02 ng/mL

## 2012-07-20 ENCOUNTER — Encounter: Payer: Self-pay | Admitting: Neurosurgery

## 2012-07-22 ENCOUNTER — Encounter: Payer: Self-pay | Admitting: Neurosurgery

## 2012-08-22 ENCOUNTER — Encounter: Payer: Self-pay | Admitting: Neurosurgery

## 2012-09-02 ENCOUNTER — Ambulatory Visit: Payer: Self-pay | Admitting: Internal Medicine

## 2012-09-02 LAB — CBC CANCER CENTER
Basophil #: 0.1 x10 3/mm (ref 0.0–0.1)
HCT: 47.2 % (ref 40.0–52.0)
Lymphocyte #: 1.7 x10 3/mm (ref 1.0–3.6)
Lymphocyte %: 21.1 %
MCV: 86 fL (ref 80–100)
Monocyte #: 0.9 x10 3/mm (ref 0.2–1.0)
Platelet: 183 x10 3/mm (ref 150–440)
RBC: 5.49 10*6/uL (ref 4.40–5.90)
WBC: 7.9 x10 3/mm (ref 3.8–10.6)

## 2012-09-21 ENCOUNTER — Ambulatory Visit: Payer: Self-pay | Admitting: Internal Medicine

## 2012-12-17 ENCOUNTER — Emergency Department: Payer: Self-pay | Admitting: Emergency Medicine

## 2013-04-21 ENCOUNTER — Ambulatory Visit: Payer: Self-pay | Admitting: Internal Medicine

## 2013-07-27 ENCOUNTER — Emergency Department: Payer: Self-pay | Admitting: Emergency Medicine

## 2013-11-29 ENCOUNTER — Encounter (INDEPENDENT_AMBULATORY_CARE_PROVIDER_SITE_OTHER): Payer: Self-pay

## 2013-11-29 ENCOUNTER — Encounter: Payer: Self-pay | Admitting: Neurology

## 2013-11-29 ENCOUNTER — Ambulatory Visit (INDEPENDENT_AMBULATORY_CARE_PROVIDER_SITE_OTHER): Payer: Medicare Other | Admitting: Neurology

## 2013-11-29 VITALS — BP 152/77 | HR 80 | Temp 97.0°F | Ht 68.0 in | Wt 240.0 lb

## 2013-11-29 DIAGNOSIS — R42 Dizziness and giddiness: Secondary | ICD-10-CM

## 2013-11-29 DIAGNOSIS — E1142 Type 2 diabetes mellitus with diabetic polyneuropathy: Secondary | ICD-10-CM

## 2013-11-29 DIAGNOSIS — R413 Other amnesia: Secondary | ICD-10-CM | POA: Insufficient documentation

## 2013-11-29 DIAGNOSIS — G44039 Episodic paroxysmal hemicrania, not intractable: Secondary | ICD-10-CM

## 2013-11-29 DIAGNOSIS — E1140 Type 2 diabetes mellitus with diabetic neuropathy, unspecified: Secondary | ICD-10-CM

## 2013-11-29 DIAGNOSIS — R269 Unspecified abnormalities of gait and mobility: Secondary | ICD-10-CM

## 2013-11-29 DIAGNOSIS — E1149 Type 2 diabetes mellitus with other diabetic neurological complication: Secondary | ICD-10-CM

## 2013-11-29 MED ORDER — TOPIRAMATE 50 MG PO TABS: 50.0000 mg | ORAL_TABLET | Freq: Two times a day (BID) | ORAL | Status: DC

## 2013-11-29 NOTE — Patient Instructions (Signed)
Trial of Topamax 50 mg daily for 2 weeks increase as tolerated to twice daily to help with paroxysmal headaches as well as neuropathy. Check EMG nerve conduction study as well as neuropathy panel labs. Check memory panel labs. Referral to physical therapy for gait and balance training. I would like to obtain prior neurological evaluation records from Dr. Cristopher Peru in [place]. Return for followup in 6 weeks or call earlier if necessary.

## 2013-11-29 NOTE — Progress Notes (Signed)
[facility] [address] [place]. [place] [address] [phone]       OFFICE CONSULT NOTE  Gary. Gary Chang Date of Birth:  [birth year] Medical Record Number:  [id number]   Referring MD:  Dr Burnett Sheng Reason for Referral:  Headache , dizziness and memory loss  HPI: Gary Chang is a 57 year pleasant Caucasian male who is accompanied today by his wife and daughter who provide most of the history. He has been having progressive dizziness and balance difficulties for the last 2-3 years. He notices this mainly when he gets up quickly or makes a sudden turn her tries to sit down. He describes this as a sensation of staggering and being off balance. He didn't he denies loss of vision, blurred vision or vertigo. He has also noticed increasing difficulty with walking with his eyes closed or in the shower. He has to hold onto the handrail while climbing steps. He has fallen a few times. He denies significant tingling numbness and burning in his feet though he does have restlessness in sleep and sleeps poorly. He admits to daytime sleepiness and he does snore at night. He has never had a sleep study done. He has been using a walker for the last 2 years now. He has history of degenerative cervical spine disease and has undergone surgery on his neck twice per Dr. Lovell Sheehan. He currently denies significant neck pain or radicular pain or bladder bowel control issues. He also has noticed intermittent headaches on his vertex for the last few years which describes as sudden and sharp and lasting only few seconds and shooting in character. The headaches are variable but may occur several times a day on a daily basis 2 on a couple of times a week. No specific triggers for headaches. The headaches are so severe at times that it makes him jump and even cry out in pain. He denies any complaints of redness, swelling, watering of his eyes or blurred vision. He has been taking gabapentin which has not  helped his headaches as well as he has tried Lidoderm patch without relief. He has not had any recent brain on neck imaging studies done. He has seen neurologist Dr. Cristopher Peru in [place] but I do not have his records to review today and patient and family unable 2 give me details about his assessment or treatment plan. He also complains of mild memory difficulties last several years which are nonprogressive. He has trouble remembering appointments as well since his recent information and often forgets to write down. He still able to however attend to his own needs and is independent in activities of daily living. The family has not identified any concerns about his behavior, hallucinations, delusions, agitation.  ROS:   14 system review of systems is positive for blurred vision, spinning sensation, shortness of breath, cough, increased thirst, joint pain, cramps, allergies, urination problems, memory loss, confusion, headache, difficulty swallowing, dizziness, depression, anxiety, decreased energy, disinterest in activities, insomnia, sleepiness and restless legs.  PMH:  Past Medical History  Diagnosis Date  . High cholesterol   . Diabetes mellitus     tingling in feet  . Arthritis   . GERD (gastroesophageal reflux disease)   . H/O hiatal hernia   . Enlarged prostate     laser surgery  . Hypertension     pcp  dr Fayrene Fearing [place]  [place]  . Cancer     skin ca removed    Social History:  History  Social History  . Marital Status: Married    Spouse Name: Wille Celeste    Number of Children: 2  . Years of Education: HS   Occupational History  . Retired    Social History Main Topics  . Smoking status: Current Every Day Smoker    Types: Pipe  . Smokeless tobacco: Never Used  . Alcohol Use: Yes     Comment: occ 1-2 beers weekly  . Drug Use: No  . Sexual Activity: Not on file   Other Topics Concern  . Not on file   Social History Narrative   Patient lives at home with his  spouse.   Caffeine Use: 1-2 cups daily    Medications:   Current Outpatient Prescriptions on File Prior to Visit  Medication Sig Dispense Refill  . amLODipine (NORVASC) 10 MG tablet Take 10 mg by mouth every evening.        Marland Kitchen aspirin EC 81 MG tablet Take 81 mg by mouth at bedtime.        . Cholecalciferol (VITAMIN D3 SUPER STRENGTH) 2000 UNITS TABS Take 2,000 Units by mouth daily.        . citalopram (CELEXA) 20 MG tablet Take 1.5 mg by mouth at bedtime.        Marland Kitchen glipiZIDE (GLUCOTROL XL) 2.5 MG 24 hr tablet Take 2.5 mg by mouth at bedtime.        Marland Kitchen losartan (COZAAR) 100 MG tablet Take 100 mg by mouth every morning.        . metoprolol succinate (TOPROL-XL) 25 MG 24 hr tablet Take 25 mg by mouth at bedtime.        . potassium chloride SA (K-DUR,KLOR-CON) 20 MEQ tablet Take 20 mEq by mouth every morning.        . hydrALAZINE (APRESOLINE) 100 MG tablet Take 1 tablet (100 mg total) by mouth 3 (three) times daily.  90 tablet  1   No current facility-administered medications on file prior to visit.    Allergies:   Allergies  Allergen Reactions  . Statins Other (See Comments)    "Numbness and tingling"    Physical Exam General: well developed, well nourished, seated, in no evident distress Head: head normocephalic and atraumatic. Orohparynx benign Neck: supple with no carotid or supraclavicular bruits Cardiovascular: regular rate and rhythm, no murmurs Musculoskeletal: no deformity Skin:  no rash/petichiae Vascular:  Normal pulses all extremities Filed Vitals:   11/29/13 1347  BP: 152/77  Pulse: 80  Temp: 97 F (36.1 C)    Neurologic Exam Mental Status: Awake and fully alert. Oriented to place and time. Recent and remote memory diminished. Mini-Mental status exam score 28/30. clock drawing 4/4. Geriatric depression scale 3 only and not depressed.. Attention span, concentration and fund of knowledge appropriate. Mood and affect appropriate.  Cranial Nerves: Fundoscopic exam  reveals sharp disc margins. Pupils equal, briskly reactive to light. Extraocular movements full without nystagmus. Visual fields full to confrontation. Hearing intact. Facial sensation intact. Face, tongue, palate moves normally and symmetrically.  Motor: Normal bulk and tone. Normal strength in all tested extremity muscles. Sensory.: Intact touch and pinprick sensation but diminished vibration bilaterally from the ankle down. Slight impairment of position sense over the toes. Romberg sign is positive. Coordination: Rapid alternating movements normal in all extremities. Finger-to-nose and heel-to-shin performed accurately bilaterally. Gait and Station: Arises from chair without difficulty. Stance is wide-based Gait demonstrates normal stride length and mild imbalance . Unable to heel, toe and tandem walk without difficulty.  Reflexes: 1+ and symmetric. Toes downgoing.    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 Long-standing mild memory difficulties likely from age appropriate mild cognitive impairment.    PLAN: I had a long discussion with the patient, wife and daughter regarding his symptoms, discussed my evaluation, plan and answered questions. This was a prolonged visit with extensive history taking, discussion with patient and family members, review of data and medical decision making of high complexity with face-to-face time of 60 minutes Trial of Topamax 50 mg daily for 2 weeks increase as tolerated to twice daily to help with paroxysmal headaches as well as neuropathy. Check EMG nerve conduction study as well as neuropathy panel labs. Check memory panel labs. Referral to physical therapy for gait and balance training. I would like to obtain prior neurological evaluation records from Dr. Cristopher Peru in  [place]. Return for followup in 6 weeks or call earlier if necessary.

## 2013-12-02 ENCOUNTER — Other Ambulatory Visit: Payer: Self-pay | Admitting: Neurology

## 2013-12-02 DIAGNOSIS — G609 Hereditary and idiopathic neuropathy, unspecified: Secondary | ICD-10-CM

## 2013-12-02 LAB — TSH: TSH: 2.15 u[IU]/mL (ref 0.450–4.500)

## 2013-12-02 LAB — PROTEIN ELECTROPHORESIS, SERUM
A/G Ratio: 1.3 (ref 0.7–2.0)
Albumin ELP: 3.8 g/dL (ref 3.2–5.6)
Beta: 1.2 g/dL (ref 0.6–1.3)
Gamma Globulin: 0.7 g/dL (ref 0.5–1.6)
Globulin, Total: 3 g/dL (ref 2.0–4.5)
M-Spike, %: 0.3 g/dL — ABNORMAL HIGH

## 2013-12-02 LAB — HEMOGLOBIN A1C: Est. average glucose Bld gHb Est-mCnc: 151 mg/dL

## 2013-12-02 LAB — ANA: Anti Nuclear Antibody(ANA): NEGATIVE

## 2013-12-06 ENCOUNTER — Ambulatory Visit (INDEPENDENT_AMBULATORY_CARE_PROVIDER_SITE_OTHER): Payer: Medicare Other | Admitting: Neurology

## 2013-12-06 ENCOUNTER — Encounter (INDEPENDENT_AMBULATORY_CARE_PROVIDER_SITE_OTHER): Payer: Medicare Other | Admitting: Radiology

## 2013-12-06 DIAGNOSIS — E1140 Type 2 diabetes mellitus with diabetic neuropathy, unspecified: Secondary | ICD-10-CM

## 2013-12-06 DIAGNOSIS — Z0289 Encounter for other administrative examinations: Secondary | ICD-10-CM

## 2013-12-06 DIAGNOSIS — R42 Dizziness and giddiness: Secondary | ICD-10-CM

## 2013-12-06 DIAGNOSIS — G63 Polyneuropathy in diseases classified elsewhere: Secondary | ICD-10-CM

## 2013-12-06 DIAGNOSIS — G5602 Carpal tunnel syndrome, left upper limb: Secondary | ICD-10-CM

## 2013-12-06 NOTE — Procedures (Signed)
     HISTORY:  Gary Chang is an 79 year old gentleman with a history of dizziness, and gait instability. The patient has borderline diabetes, and he is being evaluated for a possible peripheral neuropathy. The patient denies any significant numbness of the feet. The patient has had prior lumbosacral spine surgery.  NERVE CONDUCTION STUDIES:  Nerve conduction studies were performed on the left upper extremity. The distal motor latency for the left median nerve was prolonged, with a normal motor amplitude seen. The distal motor latency and motor amplitudes for the left ulnar nerve was normal. The F wave latencies for the left median and ulnar nerves were prolonged, with normal nerve conduction velocities seen for these nerves. The sensory latency for the left median nerve was prolonged, borderline normal for the left ulnar nerve.  Nerve conduction studies were performed on both lower extremities. The distal motor latencies and motor amplitudes for the peroneal and posterior tibial nerves were normal bilaterally. The nerve conduction velocities for the peroneal nerves were normal on the right, slowed on the left. The posterior tibial nerve conduction velocities were slowed on the left, normal on the right. The F wave latencies for the peroneal nerves were prolonged on the right, normal on the left, and slightly prolonged for the posterior tibial nerves bilaterally. The peroneal sensory latencies were absent bilaterally, with slight prolongation of the sural sensory latency on the left, normal on the right.  EMG STUDIES:  EMG study was performed on the right lower extremity:  The tibialis anterior muscle reveals 2 to 4K motor units with full recruitment. No fibrillations or positive waves were seen. The peroneus tertius muscle reveals 2 to 4K motor units with full recruitment. No fibrillations or positive waves were seen. The medial gastrocnemius muscle reveals 1 to 3K motor units with full  recruitment. No fibrillations or positive waves were seen. The vastus lateralis muscle reveals 2 to 4K motor units with full recruitment. No fibrillations or positive waves were seen. The iliopsoas muscle reveals 2 to 4K motor units with full recruitment. No fibrillations or positive waves were seen. The biceps femoris muscle (long head) reveals 2 to 4K motor units with full recruitment. No fibrillations or positive waves were seen. The lumbosacral paraspinal muscles were tested at 3 levels, and revealed no abnormalities of insertional activity at the upper and lower levels tested. One plus fibrillations and positive waves were seen at the mid-level. There was good relaxation.   IMPRESSION:  Nerve conduction studies done on the left upper extremity and both lower extremities shows evidence of a mild left carpal tunnel syndrome with evidence of an overlying mild primarily axonal peripheral neuropathy. EMG evaluation of the right lower extremity is relatively unremarkable, without clear evidence of an overlying lumbosacral radiculopathy.  Marlan Palau MD 12/06/2013 4:41 PM  [facility] [address] [place], [place] [address]  Phone [phone] Fax [phone]

## 2013-12-09 ENCOUNTER — Ambulatory Visit
Admission: RE | Admit: 2013-12-09 | Discharge: 2013-12-09 | Disposition: A | Discharge status: Home / Self Care | Payer: Medicare Other | Source: Ambulatory Visit | Attending: Neurology | Admitting: Neurology

## 2013-12-09 ENCOUNTER — Other Ambulatory Visit: Payer: Self-pay | Admitting: Neurology

## 2013-12-09 DIAGNOSIS — T1590XA Foreign body on external eye, part unspecified, unspecified eye, initial encounter: Secondary | ICD-10-CM

## 2013-12-09 DIAGNOSIS — R269 Unspecified abnormalities of gait and mobility: Secondary | ICD-10-CM

## 2013-12-09 DIAGNOSIS — E1140 Type 2 diabetes mellitus with diabetic neuropathy, unspecified: Secondary | ICD-10-CM

## 2013-12-09 DIAGNOSIS — R42 Dizziness and giddiness: Secondary | ICD-10-CM

## 2013-12-09 DIAGNOSIS — G44039 Episodic paroxysmal hemicrania, not intractable: Secondary | ICD-10-CM

## 2013-12-09 MED ORDER — GADOBENATE DIMEGLUMINE 529 MG/ML IV SOLN
10.0000 mL | Freq: Once | INTRAVENOUS | Status: AC | PRN
  Administered 2013-12-09: 10 mL via INTRAVENOUS

## 2013-12-16 ENCOUNTER — Ambulatory Visit: Payer: Self-pay | Admitting: Nephrology

## 2013-12-28 ENCOUNTER — Ambulatory Visit: Payer: Medicare Other | Admitting: Physical Therapy

## 2014-01-04 ENCOUNTER — Ambulatory Visit: Payer: Medicare Other | Attending: Neurology | Admitting: Physical Therapy

## 2014-01-04 DIAGNOSIS — IMO0001 Reserved for inherently not codable concepts without codable children: Secondary | ICD-10-CM | POA: Insufficient documentation

## 2014-01-04 DIAGNOSIS — M6281 Muscle weakness (generalized): Secondary | ICD-10-CM | POA: Insufficient documentation

## 2014-01-04 DIAGNOSIS — R269 Unspecified abnormalities of gait and mobility: Secondary | ICD-10-CM | POA: Insufficient documentation

## 2014-01-10 ENCOUNTER — Ambulatory Visit: Payer: Medicare Other | Admitting: Physical Therapy

## 2014-01-17 ENCOUNTER — Ambulatory Visit: Payer: Medicare Other | Admitting: Physical Therapy

## 2014-01-19 ENCOUNTER — Ambulatory Visit: Payer: Medicare Other | Admitting: Physical Therapy

## 2014-01-24 ENCOUNTER — Ambulatory Visit: Payer: Medicare Other | Admitting: Physical Therapy

## 2014-01-26 ENCOUNTER — Ambulatory Visit: Payer: Medicare Other | Admitting: Physical Therapy

## 2014-01-30 ENCOUNTER — Other Ambulatory Visit: Payer: Self-pay | Admitting: Neurology

## 2014-01-31 ENCOUNTER — Encounter: Payer: Self-pay | Admitting: Neurology

## 2014-01-31 ENCOUNTER — Ambulatory Visit (INDEPENDENT_AMBULATORY_CARE_PROVIDER_SITE_OTHER): Payer: Medicare Other | Admitting: Neurology

## 2014-01-31 ENCOUNTER — Ambulatory Visit: Payer: Medicare Other | Admitting: Physical Therapy

## 2014-01-31 ENCOUNTER — Encounter (INDEPENDENT_AMBULATORY_CARE_PROVIDER_SITE_OTHER): Payer: Self-pay

## 2014-01-31 VITALS — BP 136/82 | HR 75 | Ht 68.0 in | Wt 235.1 lb

## 2014-01-31 DIAGNOSIS — E1140 Type 2 diabetes mellitus with diabetic neuropathy, unspecified: Secondary | ICD-10-CM

## 2014-01-31 DIAGNOSIS — E1149 Type 2 diabetes mellitus with other diabetic neurological complication: Secondary | ICD-10-CM

## 2014-01-31 DIAGNOSIS — E1142 Type 2 diabetes mellitus with diabetic polyneuropathy: Secondary | ICD-10-CM

## 2014-01-31 MED ORDER — TOPIRAMATE 25 MG PO TABS
25.0000 mg | ORAL_TABLET | Freq: Two times a day (BID) | ORAL | Status: DC
Start: 2014-01-31 — End: 2016-05-15

## 2014-01-31 NOTE — Patient Instructions (Signed)
I had a long discussion with the patient, wife and daughter regarding his neuropathy , discussed results of tests and answered questions. Recommend decrease Topamax to 25 mg twice a day to 2 cognitive side effects. His memory difficulties persist may discontinue it he is I have instructed the patient to call me in 2 weeks. I encouraged him to get up slowly and walk carefully and avoid sudden movements. Return for followup in 3 months with Charlott Holler, NP or. call earlier if necessary

## 2014-01-31 NOTE — Progress Notes (Signed)
[facility] [address] [place]. [place] [address] [phone]       OFFICE Follow Up Visit  NOTE  Gary. Gary Chang Date of Birth:  Aug 20, 1928 Medical Record Number:  [id number]   Referring MD:  Dr Kary Kos Reason for Referral:  Headache , dizziness and memory loss  HPI:   Update 01/30/14 : He returns for followup after last visit 2 months ago. He reports improvement in his headaches after starting the Topamax seems to be helping however his family has noted that his short-term memory seems to have gotten worse and he has trouble remembering recent information. The patient denies tingling numbness or pain in his feet. He has had no falls. Continues to have mild dizziness but he has learned to get up slowly and avoid certain movements it seems to be helping. He does have some pain in the left hand but he has been wearing a splint for carpal tunnel at night which seems to be helping. Here an episode of sudden onset hearing loss in the left ear one week ago and he has been seen by ENT physician and started on prednisone which has not helped so far. I have reviewed his neurological records from [place] at [facility] which are available for me today. I also reviewed the MRI scan of the brain which he had on 12/19/13 which showed moderate degree of generalized cerebral atrophy and mild degree of changes of chronic microvascular ischemia. No conduction EMG study done on 12/06/13 by Dr. Jannifer Franklin shows evidence of mild carpal tunnel in the left hand as well as mild axonal peripheral neuropathy. Serum protein electrophoresis shows a small M protein spike which is nonspecific. Vitamin B12, TSH and RPR are normal. Hemoglobin A1c is elevated at 6.9. ANA is negative. Initial Consult  11/29/2013 :Gary Chang is a 22 year pleasant Caucasian male who is accompanied today by his wife and daughter who provide most of the history. He has been having progressive dizziness and balance  difficulties for the last 2-3 years. He notices this mainly when he gets up quickly or makes a sudden turn her tries to sit down. He describes this as a sensation of staggering and being off balance. He didn't he denies loss of vision, blurred vision or vertigo. He has also noticed increasing difficulty with walking with his eyes closed or in the shower. He has to hold onto the handrail while climbing steps. He has fallen a few times. He denies significant tingling numbness and burning in his feet though he does have restlessness in sleep and sleeps poorly. He admits to daytime sleepiness and he does snore at night. He has never had a sleep study done. He has been using a walker for the last 2 years now. He has history of degenerative cervical spine disease and has undergone surgery on his neck twice per Dr. Arnoldo Morale. He currently denies significant neck pain or radicular pain or bladder bowel control issues. He also has noticed intermittent headaches on his vertex for the last few years which describes as sudden and sharp and lasting only few seconds and shooting in character. The headaches are variable but may occur several times a day on a daily basis 2 on a couple of times a week. No specific triggers for headaches. The headaches are so severe at times that it makes him jump and even cry out in pain. He denies any complaints of redness, swelling, watering of his eyes or blurred vision. He has been  taking gabapentin which has not helped his headaches as well as he has tried Lidoderm patch without relief. He has not had any recent brain on neck imaging studies done. He has seen neurologist Dr. Jennings Books in [place] but I do not have his records to review today and patient and family unable 2 give me details about his assessment or treatment plan. He also complains of mild memory difficulties last several years which are nonprogressive. He has trouble remembering appointments as well since his recent information  and often forgets to write down. He still able to however attend to his own needs and is independent in activities of daily living. The family has not identified any concerns about his behavior, hallucinations, delusions, agitation.  ROS:   14 system review of systems is positive for blurred vision, spinning sensation, shortness of breath, cough, increased thirst, joint pain, cramps, allergies, urination problems, memory loss, confusion, headache, difficulty swallowing, dizziness, depression, anxiety, decreased energy, disinterest in activities, insomnia, sleepiness and restless legs.  PMH:  Past Medical History  Diagnosis Date  . High cholesterol   . Diabetes mellitus     tingling in feet  . Arthritis   . GERD (gastroesophageal reflux disease)   . H/O hiatal hernia   . Enlarged prostate     laser surgery  . Hypertension     pcp  dr Jeneen Rinks [place]  [place]  . Cancer     skin ca removed    Social History:  History   Social History  . Marital Status: Married    Spouse Name: Gary Chang    Number of Children: 2  . Years of Education: HS   Occupational History  . Retired    Social History Main Topics  . Smoking status: Former Smoker    Types: Pipe  . Smokeless tobacco: Never Used  . Alcohol Use: Yes     Comment: occ 1-2 beers a month  . Drug Use: No  . Sexual Activity: Not on file   Other Topics Concern  . Not on file   Social History Narrative   Patient lives at home with his spouse.   Caffeine Use: 1-2 cups daily    Medications:   Current Outpatient Prescriptions on File Prior to Visit  Medication Sig Dispense Refill  . amLODipine (NORVASC) 10 MG tablet Take 10 mg by mouth every evening.        Marland Kitchen aspirin EC 81 MG tablet Take 81 mg by mouth at bedtime.        Marland Kitchen b complex vitamins tablet Take 1 tablet by mouth daily.      . Cholecalciferol (VITAMIN D3 SUPER STRENGTH) 2000 UNITS TABS Take 2,000 Units by mouth daily.        . citalopram (CELEXA) 20 MG tablet Take 1.5  mg by mouth at bedtime.        . gabapentin (NEURONTIN) 600 MG tablet Take 1 tablet by mouth daily.      Marland Kitchen glipiZIDE (GLUCOTROL XL) 2.5 MG 24 hr tablet Take 2.5 mg by mouth at bedtime.        . hydrALAZINE (APRESOLINE) 100 MG tablet Take 1 tablet (100 mg total) by mouth 3 (three) times daily.  90 tablet  1  . hydrALAZINE (APRESOLINE) 100 MG tablet Take 1 tablet by mouth 2 (two) times daily.      . hydrochlorothiazide (HYDRODIURIL) 25 MG tablet Take 25 mg by mouth daily.      Marland Kitchen HYDROcodone-acetaminophen (NORCO/VICODIN) 5-325 MG per tablet  Take 1 tablet by mouth as needed.      Marland Kitchen losartan (COZAAR) 100 MG tablet Take 100 mg by mouth every morning.        . metoprolol succinate (TOPROL-XL) 25 MG 24 hr tablet Take 25 mg by mouth at bedtime.        . potassium chloride SA (K-DUR,KLOR-CON) 20 MEQ tablet Take 20 mEq by mouth every morning.         No current facility-administered medications on file prior to visit.    Allergies:   Allergies  Allergen Reactions  . Statins Other (See Comments)    "Numbness and tingling"    Physical Exam General: well developed, well nourished, seated, in no evident distress Head: head normocephalic and atraumatic. Orohparynx benign Neck: supple with no carotid or supraclavicular bruits Cardiovascular: regular rate and rhythm, no murmurs Musculoskeletal: no deformity Skin:  no rash/petichiae Vascular:  Normal pulses all extremities Filed Vitals:   01/31/14 1506  BP: 136/82  Pulse: 75    Neurologic Exam Mental Status: Awake and fully alert. Oriented to place and time. Recent and remote memory diminished. Mini-Mental status exam score 28/30. clock drawing 4/4. Geriatric depression scale 3 only and not depressed.. Attention span, concentration and fund of knowledge appropriate. Mood and affect appropriate.  Cranial Nerves: Fundoscopic exam reveals sharp disc margins. Pupils equal, briskly reactive to light. Extraocular movements full without nystagmus. Visual  fields full to confrontation. Hearing intact. Facial sensation intact. Face, tongue, palate moves normally and symmetrically.  Motor: Normal bulk and tone. Normal strength in all tested extremity muscles. Sensory.: Intact touch and pinprick sensation but diminished vibration bilaterally from the ankle down. Slight impairment of position sense over the toes. Romberg sign is positive. Coordination: Rapid alternating movements normal in all extremities. Finger-to-nose and heel-to-shin performed accurately bilaterally. Gait and Station: Arises from chair without difficulty. Stance is wide-based Gait demonstrates normal stride length and mild imbalance . Unable to heel, toe and tandem walk without difficulty.  Reflexes: 1+ and symmetric. Toes downgoing.    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 Long-standing mild memory difficulties likely from age appropriate mild cognitive impairment slightly worse on Topamax.    PLAN: I had a long discussion with the patient, wife and daughter regarding his symptoms, discussed results of my evaluation, neuropathy findings, treatment plan and answered questions. Recommend decrease Topamax to 25 mg twice a day due to cognitive side effects. If his memory difficulties persist may discontinue it he is I have instructed the patient to call me in 2 weeks. I encouraged him to get up slowly and walk carefully and avoid sudden movements. Return for followup in 3 months with Charlott Holler, NP or. call earlier if necessary

## 2014-02-09 ENCOUNTER — Emergency Department: Payer: Self-pay | Admitting: Emergency Medicine

## 2014-02-09 LAB — URINALYSIS, COMPLETE
BILIRUBIN, UR: NEGATIVE
BLOOD: NEGATIVE
Bacteria: NONE SEEN
Glucose,UR: NEGATIVE mg/dL (ref 0–75)
Leukocyte Esterase: NEGATIVE
Nitrite: NEGATIVE
PROTEIN: NEGATIVE
Ph: 7 (ref 4.5–8.0)
Specific Gravity: 1.013 (ref 1.003–1.030)
Squamous Epithelial: NONE SEEN
WBC UR: <1 /HPF (ref 0–5)

## 2014-02-09 LAB — CBC WITH DIFFERENTIAL/PLATELET
BASOS ABS: 0.1 10*3/uL (ref 0.0–0.1)
BASOS PCT: 0.6 %
Eosinophil #: 0.2 10*3/uL (ref 0.0–0.7)
Eosinophil %: 1.9 %
HCT: 49.1 % (ref 40.0–52.0)
HGB: 16.4 g/dL (ref 13.0–18.0)
Lymphocyte #: 1.5 10*3/uL (ref 1.0–3.6)
Lymphocyte %: 15.4 %
MCH: 29.2 pg (ref 26.0–34.0)
MCHC: 33.3 g/dL (ref 32.0–36.0)
MCV: 88 fL (ref 80–100)
MONO ABS: 1.3 x10 3/mm — AB (ref 0.2–1.0)
Monocyte %: 13.2 %
Neutrophil #: 6.9 10*3/uL — ABNORMAL HIGH (ref 1.4–6.5)
Neutrophil %: 68.9 %
Platelet: 140 10*3/uL — ABNORMAL LOW (ref 150–440)
RBC: 5.61 10*6/uL (ref 4.40–5.90)
RDW: 14.6 % — AB (ref 11.5–14.5)
WBC: 10 10*3/uL (ref 3.8–10.6)

## 2014-02-09 LAB — COMPREHENSIVE METABOLIC PANEL
ALT: 25 U/L (ref 12–78)
AST: 16 U/L (ref 15–37)
Albumin: 3.6 g/dL (ref 3.4–5.0)
Alkaline Phosphatase: 94 U/L
Anion Gap: 9 (ref 7–16)
BUN: 18 mg/dL (ref 7–18)
Bilirubin,Total: 0.9 mg/dL (ref 0.2–1.0)
CO2: 26 mmol/L (ref 21–32)
CREATININE: 1.63 mg/dL — AB (ref 0.60–1.30)
Calcium, Total: 8.8 mg/dL (ref 8.5–10.1)
Chloride: 97 mmol/L — ABNORMAL LOW (ref 98–107)
EGFR (African American): 44 — ABNORMAL LOW
EGFR (Non-African Amer.): 38 — ABNORMAL LOW
Glucose: 150 mg/dL — ABNORMAL HIGH (ref 65–99)
OSMOLALITY: 269 (ref 275–301)
Potassium: 2.8 mmol/L — ABNORMAL LOW (ref 3.5–5.1)
Sodium: 132 mmol/L — ABNORMAL LOW (ref 136–145)
TOTAL PROTEIN: 7 g/dL (ref 6.4–8.2)

## 2014-02-09 LAB — TROPONIN I: Troponin-I: <0.02 ng/mL

## 2014-02-09 LAB — LIPASE, BLOOD: Lipase: 129 U/L (ref 73–393)

## 2014-05-18 ENCOUNTER — Ambulatory Visit: Payer: Medicare Other | Admitting: Nurse Practitioner

## 2015-02-21 ENCOUNTER — Ambulatory Visit: Payer: Self-pay | Admitting: Unknown Physician Specialty

## 2015-03-06 ENCOUNTER — Ambulatory Visit: Payer: Self-pay | Admitting: Neurology

## 2015-04-15 NOTE — H&P (Signed)
PATIENT NAME:  Gary, Chang MR#:  [id number] DATE OF BIRTH:  Jul 21, 1928  DATE OF ADMISSION:  01/23/2012  PRIMARY CARE PHYSICIAN: [name], MD   CHIEF COMPLAINT: Elevated blood pressure and chest discomfort.   HISTORY OF PRESENT ILLNESS: Gary Chang is a pleasant 81 year old Caucasian gentleman with past medical history of hypertension and type II diabetes who comes to the Emergency Room accompanied by his wife with the above-mentioned chief complaint. The patient said he has not been feeling well for the past few days, has noted his blood pressure has been staying on the higher side, mainly his systolic blood pressure which is staying in the 190's to 200's despite taking his medications regularly. The patient also started noticing some chest discomfort starting a couple of days ago, got better and restarted again this morning along with some shortness of breath on routine exertion. He comes to the Emergency Room. He is hemodynamically stable other than his blood pressure systolic is still in the 856'D to 200's. He has some chest discomfort, rates 3/10. Denies any shortness of breath. EKG does not show any acute changes. His first set of cardiac enzymes are negative. He is being admitted for further evaluation and management.   PAST MEDICAL HISTORY:  1. Hypertension.  2. Type II diabetes.  3. Hyperlipidemia.  4. Gastroesophageal reflux.  5. Anxiety/depression.  6. Left hernia with repair in the past.  7. Kidney stone removal in 1978.  8. Arthritis.  9. Abdominal aortic aneurysm repaired in 05/2010.  10. Mild chronic obstructive pulmonary disease.  11. History of melanoma on the back.  PAST SURGICAL HISTORY:  1. Neck surgery. 2. Back surgery. 3. Lumbar spine.  4. Kidney stone surgery.  5. Left hernia repair.  6. Abdominal aortic aneurysm repair by Dr. Lucky Cowboy in 2011.   ALLERGIES: Statins and Zetia.   MEDICATIONS ON DISCHARGE:  1. Amlodipine 10 mg daily.  2. Aspirin 81 mg  daily.  3. Citalopram 30 mg daily.  4. Glipizide 2.5 mg at bedtime.  5. Hydralazine 100 mg 3 times a day.  6. Klor-Con 20 mEq p.o. b.i.d.  7. Losartan 100 mg daily.  8. Metoprolol 25 mg p.o. daily.  9. Norco 1 to 2 every six hours as needed for pain.  10. Rapaflo 8 mg daily.  11. Vitamin D3 2000 international units daily.   FAMILY HISTORY: Father with tuberculosis. Mother with MI at age 79, hypertension, diabetes, and obesity.   SOCIAL HISTORY: He is married, has two children. He is a retired Games developer.   REVIEW OF SYSTEMS: CONSTITUTIONAL: No fever. Positive for fatigue and weakness. EYES: No blurred or double vision. ENT: No tinnitus, ear pain, hearing loss. RESPIRATORY: No cough, wheeze, hemoptysis. CARDIOVASCULAR: Positive for some chest pain and elevated blood pressure. GI: No nausea, vomiting, diarrhea, or abdominal pain. GU: No dysuria or hematuria. ENDOCRINE: No polyuria, nocturia, or thyroid problems. HEMATOLOGY: No anemia or easy bruising. SKIN: No acne or rash. MUSCULOSKELETAL: Positive for arthritis. NEUROLOGIC: No cerebrovascular accident or transient ischemic attack. PSYCH: No anxiety or depression. All other systems reviewed and negative.   PHYSICAL EXAMINATION:   GENERAL: The patient is awake, alert, and oriented x3 not in acute distress.   VITAL SIGNS: He is afebrile, pulse 92, blood pressure 198/93, sats 93 to 94% on 2 liters.   HEENT: Atraumatic, normocephalic. Pupils equal, round, and reactive to light and accommodation. EOMI intact. Oral mucosa is moist.   NECK: Supple. No JVD. No carotid bruit.   RESPIRATORY: Clear  to auscultation bilaterally. No rales, rhonchi, respiratory distress, or labored breathing.   CARDIOVASCULAR: Both the heart sounds are normal. Rate, rhythm is regular. PMI not lateralized. Chest nontender. Good pedal pulses. Good femoral pulses. No lower extremity edema.   ABDOMEN: Soft, benign, nontender. No organomegaly. Positive bowel sounds.    NEUROLOGIC: Grossly intact cranial nerves II through XII. No motor or sensory deficits.   PSYCH: The patient is awake, alert, and oriented x3.   LABORATORY, DIAGNOSTIC, AND RADIOLOGICAL DATA: EKG shows normal sinus rhythm with Q waves in septal leads. The patient's first set of cardiac enzymes, CBC and comprehensive metabolic panel within normal limits.   PLAN:  1. Admit patient for overnight observation on telemetry floor.  2. 2 gram sodium diet.  3. Will continue all of the patient's home medication. I will increase his metoprolol to twice a day.  4. Continue aspirin, losartan, hydralazine, and beta-blockers.  5. Heparin for DVT prophylaxis.  6. Cardiology consultation with Dr. Ubaldo Glassing. Case was discussed with him.  7. Echo Doppler of the heart.  8. Sliding scale insulin along with glipizide.  9. Further work-up according to the patient's clinical course.   Hospital admission plan was discussed with the patient. Case was also discussed with Dr. Ubaldo Glassing.     TIME SPENT: 45 minutes.   ____________________________ Hart Rochester Posey Pronto, MD sap:drc D: 01/23/2012 16:43:43 ET T: 01/23/2012 16:56:28 ET JOB#: [id number]  cc: [place] Zou A. Posey Pronto, MD, <Dictator> Irven Easterly. Kary Kos, MD Ilda Basset MD ELECTRONICALLY SIGNED 01/24/2012 15:42

## 2015-04-15 NOTE — Consult Note (Signed)
General Aspect 81 yo male with history of hypertension and diabetes who was admited with chest and abdominal discomfort and elevated blood pessure. He states he has ben compliant with his medicaitons but has not felt well over the past few days. He and his wfe noted his blood pressure was elevated over his baseline and they presented to the er. He has ruled out for an mi and ekg is unremarkable. His blood pressure has improved somewhat. He denies any further chest pain. He does have some brief episodes of pain in his abdomen where he has a hernia. Echo reveals preserved lv funciton with lvh.   Physical Exam:   GEN NAD, obese    HEENT PERRL    NECK supple    RESP normal resp effort  clear BS    CARD Regular rate and rhythm  Normal, S1, S2    ABD denies tenderness  no Abdominal Bruits    LYMPH negative neck    EXTR negative cyanosis/clubbing, negative edema    SKIN normal to palpation    NEURO cranial nerves intact, motor/sensory function intact    PSYCH A+O to time, place, person   Review of Systems:   Subjective/Chief Complaint chest tightness    General: Fatigue    Skin: No Complaints    ENT: No Complaints    Eyes: No Complaints    Neck: No Complaints    Respiratory: No Complaints    Cardiovascular: Chest pain or discomfort    Gastrointestinal: No Complaints    Genitourinary: No Complaints    Vascular: No Complaints    Musculoskeletal: No Complaints    Neurologic: No Complaints    Hematologic: No Complaints    Endocrine: No Complaints    Psychiatric: No Complaints    Review of Systems: All other systems were reviewed and found to be negative    Medications/Allergies Reviewed Medications/Allergies reviewed     Hyperlipidemia:    Abdominal Aortic Anuerysm (not repaired):    Diabetes Mellitus, Type II (NIDD):    gerd:    Hernia, Inguinal:    Hernia, Hiatal:    Panic Attacks:    Aneurysm:    HTN:   Home Medications:  Aspirin 81 mg:    once a day , Active  Amlodipine 10 mg:   once a day , Active  Glipizide 2.5 mg:   once (at bedtime) , Active  Norco 5 mg 1-2 :   every 6 hours as needed  for pain, Active  Klor-Con M20 oral tablet, extended release: 1 tab(s) orally 2 times a day, Active  Vitamin D3 2000 intl units oral tablet: 1 tab(s) orally once a day, Active  metoprolol succinate 25 mg oral tablet, extended release: 1 tab(s) orally once a day, Active  hydrALAZINE 100 mg oral tablet: 1 tab(s) orally 3 times a day, Active  losartan 100 mg oral tablet: 1 tab(s) orally once a day, Active  citalopram 20 mg oral tablet: 1.5 tab(s) orally once a day (at bedtime), Active  Rapaflo 8 mg oral capsule: 1 cap(s) orally once a day, Active  EKG:   EKG NSR    Abnormal NSSTTW changes    Statins: Other  Zetia: Unknown    Impression 81 yo male with history of hypertension and dm. Admitted with chest discomfort and elevated blood pressure. Ruled out for an mi. EKG unremarkable. Echo reveals preserved lv funciotn with mild pulmonary hypertension and mild lvh. Would continue current meds with addition of hctz 25 mg to  his regimen and ambulate today and discharge to home if stable with outpatient follow up .  Consideration for sleep study as outpatient to eveluate for sleep apnea as possible etiology of elevated pulmonary pressures.    Plan 1. Continue outpatient meds 2. Add HCTZ 25 mg daily 3. Weight loss 4. Ambulate and discharge if stable.   Electronic Signatures: Teodoro Spray (MD)  (Signed 02-Feb-13 10:01)  Authored: General Aspect/Present Illness, History and Physical Exam, Review of System, Past Medical History, Home Medications, EKG , Allergies, Impression/Plan   Last Updated: 02-Feb-13 10:01 by Teodoro Spray (MD)

## 2015-04-15 NOTE — Discharge Summary (Signed)
PATIENT NAME:  Gary Chang, Gary Chang MR#:  [id number] DATE OF BIRTH:  23-Nov-1928  DATE OF ADMISSION:  01/23/2012 DATE OF DISCHARGE:  01/24/2012  ADMITTING DIAGNOSES:  1. Elevated blood pressure.  2. Chest pressure.   DISCHARGE DIAGNOSES:  1. Accelerated hypertension in the setting of the patient having neck pain, abdominal pain due to abdominal hernia, blood pressure improved.  2. Chest pain in the setting of elevated blood pressure likely due to cardiac strain from his blood pressure being elevated. Cardiac enzymes negative. Status post cardiac evaluation.  3. Hypertension.  4. Type 2 diabetes.  5. Hyperlipidemia.  6. Gastroesophageal reflux disease.  7. Anxiety/depression.  8. Abdominal hernia.  9. Kidney stone removal in 1978.  10. Osteoarthritis.  11. Abdominal aortic aneurysm repair in 05/2010.  12. Mild chronic obstructive pulmonary disease.  13. History of melanoma of the back.  14. Status post neck surgery.  15. Status post back surgery.  16. Status post lumbar spine surgery.  16. Status post kidney stone surgery.  18. Status post left hernia repair.  19. Abdominal aortic aneurysm repair by Dr. Lucky Cowboy in 2011.   CONSULTANT: Dr. Ubaldo Glassing.   LABORATORY, DIAGNOSTIC AND RADIOLOGICAL DATA: EKG showed normal sinus rhythm with Q waves in septal leads. Echocardiogram showed ejection fraction greater than 55%. Mild aortic regurgitation, mild concentric left ventricular hypertrophy, right ventricular systolic pressure is elevated at 30 to 40 mmHg. Cardiac enzymes x3 sets less than 0.02. WBC count 8.6, hemoglobin 15.2, platelet count 178. BMP: Glucose 116, BUN 13, creatinine 1.29, sodium 139, potassium 3.8, chloride 101, CO2 25, calcium 9.2. LFTs were normal. Chest x-ray showed no acute abnormality, atherosclerotic disease, degenerative changes, mild hyperinflation.   HOSPITAL COURSE: Please see history and physical done by the admitting physician. Patient is an 81 year old white male with past  medical history of hypertension, type 2 diabetes came to the ED with complaint of elevated blood pressure, chest discomfort. Apparently patient's blood pressure has been staying high over the last few days, 190s to 200s despite him taking his regular medications. He came to the ED with these complaints. He was admitted for accelerated hypertension. In light of his chest pain we were asked to admit the patient. Patient was placed on tele. Serial cardiac enzymes were done which remained negative. He was seen in consultation by cardiology. His chest pain symptoms have resolved. His blood pressure has been a little labile. HCTZ has been added and his Toprol-XL has been increased. He will need to have his blood pressure will be monitored at home and additional adjustments to his medical regimen will need to be done. Patient had echo which showed slightly elevated pulmonary artery pressures consistent with possible sleep apnea, recommended possible sleep apnea study. He states that he will discuss with his primary M.D. regarding this. Patient currently is stable. Will ambulate him in the hallway, if he is doing well then he will be discharged later today.   DISCHARGE MEDICATIONS:  1. Hydralazine 100 t.i.d. 2. Losartan 100 daily.  3. Citalopram 30 mg daily. 4. Rapaflo 8 mg daily.  5. Aspirin 81, 1 tab p.o. daily.  6. Amlodipine 10 daily.  7. Glipizide 2.5 daily.  8. Norco 5 mg q.6 p.r.n. pain.  9. Klor-Con M 20 1 tab p.o. b.i.d.  10. Vitamin D3 2000 international units daily.  11. Toprol-XL 50 daily.  12. Hydrochlorothiazide 25 daily.   DIET: Low sodium ADA diet.   ACTIVITY: As tolerated.   FOLLOW UP: Follow up with Dr. Kary Kos  in 1 to 2 weeks. Patient is to keep a log of blood pressure to take to primary M.D. He is to check blood pressure once a day.   TIME SPENT: 35 minutes.   ____________________________ Lafonda Mosses Posey Pronto, MD shp:cms D: 01/24/2012 12:22:05 ET T: 01/26/2012 11:22:45  ET JOB#: [id number]  cc: Gilma Bessette H. Posey Pronto, MD, <Dictator> Alric Seton MD ELECTRONICALLY SIGNED 02/07/2012 9:59

## 2015-06-19 ENCOUNTER — Other Ambulatory Visit: Payer: Self-pay | Admitting: Neurosurgery

## 2015-06-19 DIAGNOSIS — M542 Cervicalgia: Secondary | ICD-10-CM

## 2015-06-22 ENCOUNTER — Ambulatory Visit
Admission: RE | Admit: 2015-06-22 | Discharge: 2015-06-22 | Disposition: A | Discharge status: Home / Self Care | Payer: PPO | Source: Ambulatory Visit | Attending: Neurosurgery | Admitting: Neurosurgery

## 2015-06-22 DIAGNOSIS — M4802 Spinal stenosis, cervical region: Secondary | ICD-10-CM | POA: Insufficient documentation

## 2015-06-22 DIAGNOSIS — M2578 Osteophyte, vertebrae: Secondary | ICD-10-CM | POA: Insufficient documentation

## 2015-06-22 DIAGNOSIS — M542 Cervicalgia: Secondary | ICD-10-CM | POA: Diagnosis present

## 2015-06-22 DIAGNOSIS — Z981 Arthrodesis status: Secondary | ICD-10-CM | POA: Diagnosis not present

## 2015-10-29 ENCOUNTER — Other Ambulatory Visit: Payer: Self-pay | Admitting: Family Medicine

## 2015-10-29 DIAGNOSIS — R1310 Dysphagia, unspecified: Secondary | ICD-10-CM

## 2015-11-05 ENCOUNTER — Ambulatory Visit: Payer: PPO | Attending: Family Medicine

## 2015-11-22 ENCOUNTER — Ambulatory Visit
Admission: RE | Admit: 2015-11-22 | Discharge: 2015-11-22 | Disposition: A | Discharge status: Home / Self Care | Payer: PPO | Source: Ambulatory Visit | Attending: Family Medicine | Admitting: Family Medicine

## 2015-11-22 DIAGNOSIS — R1310 Dysphagia, unspecified: Secondary | ICD-10-CM | POA: Diagnosis present

## 2015-11-22 DIAGNOSIS — K449 Diaphragmatic hernia without obstruction or gangrene: Secondary | ICD-10-CM | POA: Insufficient documentation

## 2015-11-26 ENCOUNTER — Other Ambulatory Visit
Admission: RE | Admit: 2015-11-26 | Discharge: 2015-11-26 | Disposition: A | Discharge status: Home / Self Care | Payer: PPO | Source: Ambulatory Visit | Attending: Nephrology | Admitting: Nephrology

## 2015-11-26 ENCOUNTER — Ambulatory Visit: Admission: RE | Admit: 2015-11-26 | Payer: PPO | Source: Ambulatory Visit | Admitting: Nephrology

## 2015-11-26 DIAGNOSIS — N2581 Secondary hyperparathyroidism of renal origin: Secondary | ICD-10-CM | POA: Insufficient documentation

## 2015-11-26 DIAGNOSIS — D472 Monoclonal gammopathy: Secondary | ICD-10-CM | POA: Diagnosis present

## 2015-11-26 DIAGNOSIS — N184 Chronic kidney disease, stage 4 (severe): Secondary | ICD-10-CM | POA: Diagnosis present

## 2015-11-26 LAB — COMPREHENSIVE METABOLIC PANEL
ALT: 17 U/L (ref 17–63)
ANION GAP: 7 (ref 5–15)
AST: 20 U/L (ref 15–41)
Albumin: 4 g/dL (ref 3.5–5.0)
Alkaline Phosphatase: 87 U/L (ref 38–126)
BILIRUBIN TOTAL: 0.7 mg/dL (ref 0.3–1.2)
BUN: 22 mg/dL — ABNORMAL HIGH (ref 6–20)
CALCIUM: 9.4 mg/dL (ref 8.9–10.3)
CO2: 30 mmol/L (ref 22–32)
Chloride: 97 mmol/L — ABNORMAL LOW (ref 101–111)
Creatinine, Ser: 1.79 mg/dL — ABNORMAL HIGH (ref 0.61–1.24)
GFR, EST AFRICAN AMERICAN: 38 mL/min — AB (ref >60)
GFR, EST NON AFRICAN AMERICAN: 33 mL/min — AB (ref >60)
Glucose, Bld: 91 mg/dL (ref 65–99)
POTASSIUM: 3.5 mmol/L (ref 3.5–5.1)
Sodium: 134 mmol/L — ABNORMAL LOW (ref 135–145)
TOTAL PROTEIN: 6.9 g/dL (ref 6.5–8.1)

## 2015-11-26 LAB — CBC WITH DIFFERENTIAL/PLATELET
BASOS ABS: 0.1 10*3/uL (ref 0–0.1)
BASOS PCT: 1 %
Eosinophils Absolute: 0.2 10*3/uL (ref 0–0.7)
Eosinophils Relative: 3 %
HEMATOCRIT: 46.6 % (ref 40.0–52.0)
Hemoglobin: 15.3 g/dL (ref 13.0–18.0)
LYMPHS PCT: 19 %
Lymphs Abs: 1.7 10*3/uL (ref 1.0–3.6)
MCH: 28 pg (ref 26.0–34.0)
MCHC: 32.8 g/dL (ref 32.0–36.0)
MCV: 85.4 fL (ref 80.0–100.0)
Monocytes Absolute: 1.1 10*3/uL — ABNORMAL HIGH (ref 0.2–1.0)
Monocytes Relative: 12 %
NEUTROS ABS: 6.1 10*3/uL (ref 1.4–6.5)
Neutrophils Relative %: 65 %
PLATELETS: 190 10*3/uL (ref 150–440)
RBC: 5.46 MIL/uL (ref 4.40–5.90)
RDW: 14.5 % (ref 11.5–14.5)
WBC: 9.3 10*3/uL (ref 3.8–10.6)

## 2015-11-26 LAB — PROTEIN / CREATININE RATIO, URINE
CREATININE, URINE: 147 mg/dL
Protein Creatinine Ratio: 0.1 mg/mg{Cre} (ref 0.00–0.15)
TOTAL PROTEIN, URINE: 14 mg/dL

## 2015-11-26 LAB — PHOSPHORUS: Phosphorus: 3 mg/dL (ref 2.5–4.6)

## 2015-11-27 LAB — VITAMIN D 25 HYDROXY (VIT D DEFICIENCY, FRACTURES): Vit D, 25-Hydroxy: 40 ng/mL (ref 30.0–100.0)

## 2015-11-27 LAB — PARATHYROID HORMONE, INTACT (NO CA): PTH: 50 pg/mL (ref 15–65)

## 2015-12-25 DIAGNOSIS — R42 Dizziness and giddiness: Secondary | ICD-10-CM | POA: Diagnosis not present

## 2015-12-25 DIAGNOSIS — R51 Headache: Secondary | ICD-10-CM | POA: Diagnosis not present

## 2015-12-25 DIAGNOSIS — G8929 Other chronic pain: Secondary | ICD-10-CM | POA: Diagnosis not present

## 2015-12-25 DIAGNOSIS — Z636 Dependent relative needing care at home: Secondary | ICD-10-CM | POA: Diagnosis not present

## 2015-12-25 DIAGNOSIS — I10 Essential (primary) hypertension: Secondary | ICD-10-CM | POA: Diagnosis not present

## 2015-12-25 DIAGNOSIS — Z125 Encounter for screening for malignant neoplasm of prostate: Secondary | ICD-10-CM | POA: Diagnosis not present

## 2015-12-25 DIAGNOSIS — E1142 Type 2 diabetes mellitus with diabetic polyneuropathy: Secondary | ICD-10-CM | POA: Diagnosis not present

## 2015-12-26 DIAGNOSIS — E782 Mixed hyperlipidemia: Secondary | ICD-10-CM | POA: Diagnosis not present

## 2015-12-26 DIAGNOSIS — R0602 Shortness of breath: Secondary | ICD-10-CM | POA: Diagnosis not present

## 2015-12-26 DIAGNOSIS — I10 Essential (primary) hypertension: Secondary | ICD-10-CM | POA: Diagnosis not present

## 2015-12-31 ENCOUNTER — Ambulatory Visit: Admission: RE | Admit: 2015-12-31 | Payer: PPO | Source: Ambulatory Visit | Admitting: Gastroenterology

## 2015-12-31 ENCOUNTER — Encounter: Admission: RE | Payer: Self-pay | Source: Ambulatory Visit

## 2015-12-31 HISTORY — DX: Chronic obstructive pulmonary disease, unspecified: J44.9

## 2015-12-31 HISTORY — DX: Abdominal aortic aneurysm, without rupture: I71.4

## 2015-12-31 HISTORY — DX: Calculus of kidney: N20.0

## 2015-12-31 HISTORY — DX: Abdominal aortic aneurysm, without rupture, unspecified: I71.40

## 2015-12-31 HISTORY — DX: Major depressive disorder, single episode, unspecified: F32.9

## 2015-12-31 HISTORY — DX: Malignant melanoma of skin, unspecified: C43.9

## 2015-12-31 HISTORY — DX: Allergy, unspecified, initial encounter: T78.40XA

## 2015-12-31 HISTORY — DX: Anxiety disorder, unspecified: F41.9

## 2015-12-31 HISTORY — DX: Depression, unspecified: F32.A

## 2015-12-31 HISTORY — DX: Aneurysm of renal artery: I72.2

## 2015-12-31 SURGERY — ESOPHAGOGASTRODUODENOSCOPY (EGD) WITH PROPOFOL
Anesthesia: General

## 2016-01-09 DIAGNOSIS — E669 Obesity, unspecified: Secondary | ICD-10-CM | POA: Diagnosis not present

## 2016-01-09 DIAGNOSIS — R413 Other amnesia: Secondary | ICD-10-CM | POA: Diagnosis not present

## 2016-01-09 DIAGNOSIS — R51 Headache: Secondary | ICD-10-CM | POA: Diagnosis not present

## 2016-01-09 DIAGNOSIS — R2689 Other abnormalities of gait and mobility: Secondary | ICD-10-CM | POA: Diagnosis not present

## 2016-02-05 DIAGNOSIS — H2513 Age-related nuclear cataract, bilateral: Secondary | ICD-10-CM | POA: Diagnosis not present

## 2016-02-08 ENCOUNTER — Encounter: Payer: Self-pay | Admitting: *Deleted

## 2016-02-11 ENCOUNTER — Encounter: Payer: Self-pay | Admitting: *Deleted

## 2016-02-11 ENCOUNTER — Ambulatory Visit
Admission: RE | Admit: 2016-02-11 | Discharge: 2016-02-11 | Disposition: A | Discharge status: Home / Self Care | Payer: PPO | Source: Ambulatory Visit | Attending: Gastroenterology | Admitting: Gastroenterology

## 2016-02-11 ENCOUNTER — Encounter
Admission: RE | Disposition: A | Discharge status: Home / Self Care | Payer: Self-pay | Source: Ambulatory Visit | Attending: Gastroenterology

## 2016-02-11 ENCOUNTER — Ambulatory Visit: Payer: PPO | Admitting: *Deleted

## 2016-02-11 DIAGNOSIS — E7800 Pure hypercholesterolemia, unspecified: Secondary | ICD-10-CM | POA: Diagnosis not present

## 2016-02-11 DIAGNOSIS — Z87891 Personal history of nicotine dependence: Secondary | ICD-10-CM | POA: Diagnosis not present

## 2016-02-11 DIAGNOSIS — Z87442 Personal history of urinary calculi: Secondary | ICD-10-CM | POA: Insufficient documentation

## 2016-02-11 DIAGNOSIS — M1990 Unspecified osteoarthritis, unspecified site: Secondary | ICD-10-CM | POA: Diagnosis not present

## 2016-02-11 DIAGNOSIS — F419 Anxiety disorder, unspecified: Secondary | ICD-10-CM | POA: Diagnosis not present

## 2016-02-11 DIAGNOSIS — E119 Type 2 diabetes mellitus without complications: Secondary | ICD-10-CM | POA: Insufficient documentation

## 2016-02-11 DIAGNOSIS — K449 Diaphragmatic hernia without obstruction or gangrene: Secondary | ICD-10-CM | POA: Diagnosis not present

## 2016-02-11 DIAGNOSIS — Z8679 Personal history of other diseases of the circulatory system: Secondary | ICD-10-CM | POA: Insufficient documentation

## 2016-02-11 DIAGNOSIS — Z85820 Personal history of malignant melanoma of skin: Secondary | ICD-10-CM | POA: Insufficient documentation

## 2016-02-11 DIAGNOSIS — K209 Esophagitis, unspecified: Secondary | ICD-10-CM | POA: Diagnosis not present

## 2016-02-11 DIAGNOSIS — K210 Gastro-esophageal reflux disease with esophagitis: Secondary | ICD-10-CM | POA: Insufficient documentation

## 2016-02-11 DIAGNOSIS — Z79899 Other long term (current) drug therapy: Secondary | ICD-10-CM | POA: Insufficient documentation

## 2016-02-11 DIAGNOSIS — Z8249 Family history of ischemic heart disease and other diseases of the circulatory system: Secondary | ICD-10-CM | POA: Insufficient documentation

## 2016-02-11 DIAGNOSIS — I10 Essential (primary) hypertension: Secondary | ICD-10-CM | POA: Insufficient documentation

## 2016-02-11 DIAGNOSIS — J449 Chronic obstructive pulmonary disease, unspecified: Secondary | ICD-10-CM | POA: Diagnosis not present

## 2016-02-11 DIAGNOSIS — Z888 Allergy status to other drugs, medicaments and biological substances status: Secondary | ICD-10-CM | POA: Insufficient documentation

## 2016-02-11 DIAGNOSIS — F329 Major depressive disorder, single episode, unspecified: Secondary | ICD-10-CM | POA: Diagnosis not present

## 2016-02-11 DIAGNOSIS — R1310 Dysphagia, unspecified: Secondary | ICD-10-CM | POA: Diagnosis not present

## 2016-02-11 DIAGNOSIS — Z7982 Long term (current) use of aspirin: Secondary | ICD-10-CM | POA: Insufficient documentation

## 2016-02-11 DIAGNOSIS — I739 Peripheral vascular disease, unspecified: Secondary | ICD-10-CM | POA: Diagnosis not present

## 2016-02-11 DIAGNOSIS — Z836 Family history of other diseases of the respiratory system: Secondary | ICD-10-CM | POA: Insufficient documentation

## 2016-02-11 DIAGNOSIS — K3189 Other diseases of stomach and duodenum: Secondary | ICD-10-CM | POA: Diagnosis not present

## 2016-02-11 HISTORY — PX: ESOPHAGOGASTRODUODENOSCOPY (EGD) WITH PROPOFOL: SHX5813

## 2016-02-11 LAB — GLUCOSE, CAPILLARY: Glucose-Capillary: 137 mg/dL — ABNORMAL HIGH (ref 65–99)

## 2016-02-11 SURGERY — ESOPHAGOGASTRODUODENOSCOPY (EGD) WITH PROPOFOL
Anesthesia: General

## 2016-02-11 MED ORDER — SODIUM CHLORIDE 0.9 % IV SOLN: INTRAVENOUS | Status: DC

## 2016-02-11 MED ORDER — SODIUM CHLORIDE 0.9 % IV SOLN
INTRAVENOUS | Status: DC
  Administered 2016-02-11 (×2): via INTRAVENOUS

## 2016-02-11 MED ORDER — PROPOFOL 10 MG/ML IV BOLUS
INTRAVENOUS | Status: DC | PRN
  Administered 2016-02-11: 50 mg via INTRAVENOUS

## 2016-02-11 NOTE — H&P (Signed)
Primary Care Physician:  [name], MD Primary Gastroenterologist:  Dr. Candace Cruise  Pre-Procedure History & Physical: HPI:  Gary Chang is a 82 y.o. male is here for an EGD  Past Medical History  Diagnosis Date  . High cholesterol   . Diabetes mellitus     tingling in feet  . Arthritis   . GERD (gastroesophageal reflux disease)   . H/O hiatal hernia   . Enlarged prostate     laser surgery  . Hypertension     pcp  dr Jeneen Rinks [place]  [place]  . Cancer ([place])     skin ca removed  . Anxiety   . Allergic state   . Abdominal aortic aneurysm (AAA) ([place])   . COPD (chronic obstructive pulmonary disease) ([place])   . Depression   . Kidney stones   . Melanoma ([place])   . Renal artery aneurysm [facility])     Past Surgical History  Procedure Laterality Date  . Hernia repair    . Kidney stone surgery    . Abdominal aortic aneurysm repair    . Back surgery      lumbar  ,cervical surgeries  . Lumbar laminectomy/decompression microdiscectomy  12/10/2011    Procedure: LUMBAR LAMINECTOMY/DECOMPRESSION MICRODISCECTOMY;  Surgeon: Ophelia Charter;  Location: [facility];  Service: Neurosurgery;;  Thoracic One-Two Laminectomy    Prior to Admission medications   Medication Sig Start Date End Date Taking? Authorizing Provider  aspirin EC 81 MG tablet Take 81 mg by mouth at bedtime.     Yes Historical Provider, MD  b complex vitamins tablet Take 1 tablet by mouth daily.   Yes Historical Provider, MD  buPROPion (WELLBUTRIN XL) 300 MG 24 hr tablet Take 300 mg by mouth daily.   Yes Historical Provider, MD  magnesium oxide (MAG-OX) 400 MG tablet Take 400 mg by mouth daily.   Yes Historical Provider, MD  tamsulosin (FLOMAX) 0.4 MG CAPS capsule Take 0.4 mg by mouth.   Yes Historical Provider, MD  ALPRAZolam Duanne Moron) 0.25 MG tablet Take 0.25 tablets by mouth as needed. 01/02/14   Historical Provider, MD  amLODipine (NORVASC) 10 MG tablet Take 10 mg by mouth every evening.      Historical Provider,  MD  Cholecalciferol (VITAMIN D3 SUPER STRENGTH) 2000 UNITS TABS Take 2,000 Units by mouth daily.      Historical Provider, MD  citalopram (CELEXA) 20 MG tablet Take 1.5 mg by mouth at bedtime.      Historical Provider, MD  gabapentin (NEURONTIN) 600 MG tablet Take 1 tablet by mouth daily. 09/26/13   Historical Provider, MD  glipiZIDE (GLUCOTROL XL) 2.5 MG 24 hr tablet Take 2.5 mg by mouth at bedtime.      Historical Provider, MD  hydrALAZINE (APRESOLINE) 100 MG tablet Take 1 tablet (100 mg total) by mouth 3 (three) times daily. 11/20/11 01/31/14  Crecencio Mc, MD  hydrALAZINE (APRESOLINE) 100 MG tablet Take 1 tablet by mouth 2 (two) times daily. 11/02/13   Historical Provider, MD  hydrochlorothiazide (HYDRODIURIL) 25 MG tablet Take 25 mg by mouth daily.    Historical Provider, MD  HYDROcodone-acetaminophen (NORCO/VICODIN) 5-325 MG per tablet Take 1 tablet by mouth as needed. 10/25/13   Historical Provider, MD  losartan (COZAAR) 100 MG tablet Take 100 mg by mouth every morning.      Historical Provider, MD  metoprolol succinate (TOPROL-XL) 25 MG 24 hr tablet Take 25 mg by mouth at bedtime.      Historical Provider,  MD  potassium chloride SA (K-DUR,KLOR-CON) 20 MEQ tablet Take 20 mEq by mouth every morning.      Historical Provider, MD  predniSONE (DELTASONE) 10 MG tablet Take 10 tablets by mouth daily. 01/23/14   Historical Provider, MD  topiramate (TOPAMAX) 25 MG tablet Take 1 tablet (25 mg total) by mouth 2 (two) times daily. 01/31/14   Garvin Fila, MD    Allergies as of 01/22/2016 - Review Complete 12/28/2015  Allergen Reaction Noted  . Statins Other (See Comments) 12/08/2011    Family History  Problem Relation Age of Onset  . Anesthesia problems Neg Hx   . Hypotension Neg Hx   . Malignant hyperthermia Neg Hx   . Pseudochol deficiency Neg Hx   . Heart Problems Mother   . Tuberculosis Father     Social History   Social History  . Marital Status: Married    Spouse Name: [name]  .  Number of Children: 2  . Years of Education: HS   Occupational History  . Retired    Social History Main Topics  . Smoking status: Former Smoker    Types: Pipe  . Smokeless tobacco: Never Used  . Alcohol Use: Yes     Comment: occ 1-2 beers a month  . Drug Use: No  . Sexual Activity: Not on file   Other Topics Concern  . Not on file   Social History Narrative   Patient lives at home with his spouse.   Caffeine Use: 1-2 cups daily    Review of Systems: See HPI, otherwise negative ROS  Physical Exam: BP 171/78 mmHg  Pulse 86  Temp(Src) 98.6 F (37 C) (Tympanic)  Resp 18  Ht 5\' 8"  (1.727 m)  Wt 99.791 kg (220 lb)  BMI 33.46 kg/m2  SpO2 98% General:   Alert,  pleasant and cooperative in NAD Head:  Normocephalic and atraumatic. Neck:  Supple; no masses or thyromegaly. Lungs:  Clear throughout to auscultation.    Heart:  Regular rate and rhythm. Abdomen:  Soft, nontender and nondistended. Normal bowel sounds, without guarding, and without rebound.   Neurologic:  Alert and  oriented x4;  grossly normal neurologically.  Impression/Plan: TORAO CASPER is here for an EGD to be performed for GERD, dysphagia  Risks, benefits, limitations, and alternatives regarding EGD have been reviewed with the patient.  Questions have been answered.  All parties agreeable.   Makeshia Seat, Lupita Dawn, MD  02/11/2016, 7:58 AM

## 2016-02-11 NOTE — Anesthesia Preprocedure Evaluation (Signed)
Anesthesia Evaluation  Patient identified by MRN, date of birth, ID band Patient awake    Reviewed: Allergy & Precautions, H&P , NPO status , Patient's Chart, lab work & pertinent test results, reviewed documented beta blocker date and time   Airway Mallampati: III  TM Distance: >3 FB Neck ROM: full    Dental no notable dental hx. (+) Caps   Pulmonary neg shortness of breath, neg sleep apnea, COPD,  COPD inhaler, neg recent URI, former smoker,    Pulmonary exam normal breath sounds clear to auscultation       Cardiovascular Exercise Tolerance: Good hypertension, (-) angina+ Peripheral Vascular Disease (AAA s/p repair in 2010)  (-) CAD, (-) Past MI, (-) Cardiac Stents and (-) CABG Normal cardiovascular exam(-) dysrhythmias (-) Valvular Problems/Murmurs Rhythm:regular Rate:Normal     Neuro/Psych PSYCHIATRIC DISORDERS (Depression) negative neurological ROS     GI/Hepatic Neg liver ROS, hiatal hernia, GERD  ,  Endo/Other  diabetes  Renal/GU Renal disease (kidney stones)  negative genitourinary   Musculoskeletal   Abdominal   Peds  Hematology negative hematology ROS (+)   Anesthesia Other Findings Past Medical History:   High cholesterol                                             Diabetes mellitus                                              Comment:tingling in feet   Arthritis                                                    GERD (gastroesophageal reflux disease)                       H/O hiatal hernia                                            Enlarged prostate                                              Comment:laser surgery   Hypertension                                                   Comment:pcp  dr Jeneen Rinks [place]  [place]   Cancer [facility])                                                   Comment:skin ca removed   Anxiety  Allergic state                                                Abdominal aortic aneurysm (AAA) ([facility])                        COPD (chronic obstructive pulmonary disease) (*              Depression                                                   Kidney stones                                                Melanoma ([place])                                               Renal artery aneurysm ([facility])                                  Reproductive/Obstetrics negative OB ROS                             Anesthesia Physical Anesthesia Plan  ASA: III  Anesthesia Plan: General   Post-op Pain Management:    Induction:   Airway Management Planned:   Additional Equipment:   Intra-op Plan:   Post-operative Plan:   Informed Consent: I have reviewed the patients History and Physical, chart, labs and discussed the procedure including the risks, benefits and alternatives for the proposed anesthesia with the patient or authorized representative who has indicated his/her understanding and acceptance.   Dental Advisory Given  Plan Discussed with: Anesthesiologist, CRNA and Surgeon  Anesthesia Plan Comments:         Anesthesia Quick Evaluation

## 2016-02-11 NOTE — Op Note (Signed)
[facility] Patient Name: Gary Chang Procedure Date: 02/11/2016 7:41 AM MRN: [id number] Account #: [id number] Date of Birth: [birth year] Admit Type: Outpatient Age: 82 Room: [facility] ENDO ROOM 4 Gender: Male Note Status: Finalized Procedure:            Upper GI endoscopy Indications:          Dysphagia, Suspected esophageal reflux Providers:            Lupita Dawn. Candace Cruise, MD Referring MD:         Irven Easterly. Kary Kos, MD (Referring MD) Medicines:            Monitored Anesthesia Care Complications:        No immediate complications. Procedure:            Pre-Anesthesia Assessment:                       - Prior to the procedure, a History and Physical was                        performed, and patient medications, allergies and                        sensitivities were reviewed. The patient's tolerance of                        previous anesthesia was reviewed.                       - The risks and benefits of the procedure and the                        sedation options and risks were discussed with the                        patient. All questions were answered and informed                        consent was obtained.                       - After reviewing the risks and benefits, the patient                        was deemed in satisfactory condition to undergo the                        procedure.                       After obtaining informed consent, the endoscope was                        passed under direct vision. Throughout the procedure,                        the patient's blood pressure, pulse, and oxygen                        saturations were monitored continuously. The Endoscope  was introduced through the mouth, and advanced to the                        second part of duodenum. The upper GI endoscopy was                        accomplished without difficulty. The patient tolerated                        the procedure  well. Findings:      LA Grade A (one or more mucosal breaks less than 5 mm, not extending       between tops of 2 mucosal folds) esophagitis was found at the       gastroesophageal junction. The scope was withdrawn. Dilation was       performed with a Maloney dilator with mild resistance at 49 Fr.      The exam of the esophagus was otherwise normal.      The entire examined stomach was normal.      The examined duodenum was normal. Impression:           - LA Grade A reflux esophagitis. Dilated.                       - Normal stomach.                       - Normal examined duodenum.                       - No specimens collected. Recommendation:       - Discharge patient to home.                       - Observe patient's clinical course.                       - The findings and recommendations were discussed with                        the patient. Procedure Code(s):    --- Professional ---                       [phone], Esophagogastroduodenoscopy, flexible, transoral;                        diagnostic, including collection of specimen(s) by                        brushing or washing, when performed (separate procedure)                       43450, Dilation of esophagus, by unguided sound or                        bougie, single or multiple passes Diagnosis Code(s):    --- Professional ---                       K21.0, Gastro-esophageal reflux disease with esophagitis                       R13.10, Dysphagia, unspecified  CPT copyright 2016 [employer]. All rights reserved. The codes documented in this report are preliminary and upon coder review may  be revised to meet current compliance requirements. Hulen Luster, MD 02/11/2016 8:18:47 AM This report has been signed electronically. Number of Addenda: 0 Note Initiated On: 02/11/2016 7:41 AM      [facility]

## 2016-02-11 NOTE — Transfer of Care (Signed)
Immediate Anesthesia Transfer of Care Note  Patient: Gary Chang  Procedure(s) Performed: Procedure(s): ESOPHAGOGASTRODUODENOSCOPY (EGD) WITH PROPOFOL (N/A)  Patient Location: PACU  Anesthesia Type:General  Level of Consciousness: awake, alert  and oriented  Airway & Oxygen Therapy: Patient Spontanous Breathing and Patient connected to nasal cannula oxygen  Post-op Assessment: Report given to RN and Post -op Vital signs reviewed and stable  Post vital signs: Reviewed and stable  Last Vitals:  Filed Vitals:   02/11/16 0714 02/11/16 0820  BP: 171/78   Pulse: 86   Temp: 37 C 35.6 C  Resp: 18     Complications: No apparent anesthesia complications

## 2016-02-11 NOTE — Anesthesia Postprocedure Evaluation (Signed)
Anesthesia Post Note  Patient: Gary Chang  Procedure(s) Performed: Procedure(s) (LRB): ESOPHAGOGASTRODUODENOSCOPY (EGD) WITH PROPOFOL (N/A)  Patient location during evaluation: PACU Anesthesia Type: General Level of consciousness: awake and alert Pain management: pain level controlled Vital Signs Assessment: post-procedure vital signs reviewed and stable Respiratory status: spontaneous breathing, nonlabored ventilation, respiratory function stable and patient connected to nasal cannula oxygen Cardiovascular status: blood pressure returned to baseline and stable Postop Assessment: no signs of nausea or vomiting Anesthetic complications: no    Last Vitals:  Filed Vitals:   02/11/16 0845 02/11/16 0855  BP: 130/72 128/68  Pulse: 74 76  Temp:    Resp: 19 19    Last Pain: There were no vitals filed for this visit.               Martha Clan

## 2016-02-12 ENCOUNTER — Encounter: Payer: Self-pay | Admitting: Gastroenterology

## 2016-02-12 LAB — SURGICAL PATHOLOGY

## 2016-02-18 DIAGNOSIS — M542 Cervicalgia: Secondary | ICD-10-CM | POA: Diagnosis not present

## 2016-02-18 DIAGNOSIS — R1013 Epigastric pain: Secondary | ICD-10-CM | POA: Diagnosis not present

## 2016-02-18 DIAGNOSIS — R51 Headache: Secondary | ICD-10-CM | POA: Diagnosis not present

## 2016-02-18 DIAGNOSIS — R109 Unspecified abdominal pain: Secondary | ICD-10-CM | POA: Diagnosis not present

## 2016-02-18 DIAGNOSIS — G500 Trigeminal neuralgia: Secondary | ICD-10-CM | POA: Diagnosis not present

## 2016-02-25 DIAGNOSIS — H2513 Age-related nuclear cataract, bilateral: Secondary | ICD-10-CM | POA: Diagnosis not present

## 2016-02-26 DIAGNOSIS — E669 Obesity, unspecified: Secondary | ICD-10-CM | POA: Diagnosis not present

## 2016-02-26 DIAGNOSIS — G608 Other hereditary and idiopathic neuropathies: Secondary | ICD-10-CM | POA: Diagnosis not present

## 2016-02-26 DIAGNOSIS — R413 Other amnesia: Secondary | ICD-10-CM | POA: Diagnosis not present

## 2016-02-26 DIAGNOSIS — R51 Headache: Secondary | ICD-10-CM | POA: Diagnosis not present

## 2016-02-26 DIAGNOSIS — R2689 Other abnormalities of gait and mobility: Secondary | ICD-10-CM | POA: Diagnosis not present

## 2016-02-28 ENCOUNTER — Emergency Department (HOSPITAL_COMMUNITY): Payer: PPO

## 2016-02-28 ENCOUNTER — Observation Stay (HOSPITAL_COMMUNITY)
Admission: EM | Admit: 2016-02-28 | Discharge: 2016-03-01 | Disposition: A | Discharge status: Home / Self Care | Payer: PPO | Attending: Infectious Disease | Admitting: Infectious Disease

## 2016-02-28 ENCOUNTER — Encounter (HOSPITAL_COMMUNITY): Payer: Self-pay | Admitting: Cardiology

## 2016-02-28 DIAGNOSIS — R200 Anesthesia of skin: Secondary | ICD-10-CM | POA: Diagnosis not present

## 2016-02-28 DIAGNOSIS — G8194 Hemiplegia, unspecified affecting left nondominant side: Secondary | ICD-10-CM | POA: Diagnosis not present

## 2016-02-28 DIAGNOSIS — I6509 Occlusion and stenosis of unspecified vertebral artery: Secondary | ICD-10-CM | POA: Insufficient documentation

## 2016-02-28 DIAGNOSIS — N400 Enlarged prostate without lower urinary tract symptoms: Secondary | ICD-10-CM | POA: Insufficient documentation

## 2016-02-28 DIAGNOSIS — M170 Bilateral primary osteoarthritis of knee: Secondary | ICD-10-CM | POA: Insufficient documentation

## 2016-02-28 DIAGNOSIS — J449 Chronic obstructive pulmonary disease, unspecified: Secondary | ICD-10-CM | POA: Diagnosis not present

## 2016-02-28 DIAGNOSIS — F329 Major depressive disorder, single episode, unspecified: Secondary | ICD-10-CM | POA: Insufficient documentation

## 2016-02-28 DIAGNOSIS — Z7982 Long term (current) use of aspirin: Secondary | ICD-10-CM | POA: Insufficient documentation

## 2016-02-28 DIAGNOSIS — R531 Weakness: Secondary | ICD-10-CM | POA: Diagnosis not present

## 2016-02-28 DIAGNOSIS — I651 Occlusion and stenosis of basilar artery: Principal | ICD-10-CM | POA: Diagnosis present

## 2016-02-28 DIAGNOSIS — F419 Anxiety disorder, unspecified: Secondary | ICD-10-CM | POA: Insufficient documentation

## 2016-02-28 DIAGNOSIS — Z85820 Personal history of malignant melanoma of skin: Secondary | ICD-10-CM | POA: Insufficient documentation

## 2016-02-28 DIAGNOSIS — Z7952 Long term (current) use of systemic steroids: Secondary | ICD-10-CM | POA: Diagnosis not present

## 2016-02-28 DIAGNOSIS — I672 Cerebral atherosclerosis: Secondary | ICD-10-CM | POA: Diagnosis not present

## 2016-02-28 DIAGNOSIS — R42 Dizziness and giddiness: Secondary | ICD-10-CM | POA: Diagnosis not present

## 2016-02-28 DIAGNOSIS — I6501 Occlusion and stenosis of right vertebral artery: Secondary | ICD-10-CM | POA: Insufficient documentation

## 2016-02-28 DIAGNOSIS — Z79899 Other long term (current) drug therapy: Secondary | ICD-10-CM | POA: Insufficient documentation

## 2016-02-28 DIAGNOSIS — Z87891 Personal history of nicotine dependence: Secondary | ICD-10-CM | POA: Diagnosis not present

## 2016-02-28 DIAGNOSIS — Z7984 Long term (current) use of oral hypoglycemic drugs: Secondary | ICD-10-CM | POA: Insufficient documentation

## 2016-02-28 DIAGNOSIS — I10 Essential (primary) hypertension: Secondary | ICD-10-CM | POA: Insufficient documentation

## 2016-02-28 DIAGNOSIS — E1140 Type 2 diabetes mellitus with diabetic neuropathy, unspecified: Secondary | ICD-10-CM | POA: Diagnosis not present

## 2016-02-28 DIAGNOSIS — E7800 Pure hypercholesterolemia, unspecified: Secondary | ICD-10-CM | POA: Insufficient documentation

## 2016-02-28 DIAGNOSIS — R2681 Unsteadiness on feet: Secondary | ICD-10-CM | POA: Insufficient documentation

## 2016-02-28 LAB — DIFFERENTIAL
BASOS ABS: 0 10*3/uL (ref 0.0–0.1)
Basophils Relative: 0 %
EOS ABS: 0.1 10*3/uL (ref 0.0–0.7)
EOS PCT: 1 %
LYMPHS ABS: 1.2 10*3/uL (ref 0.7–4.0)
Lymphocytes Relative: 14 %
Monocytes Absolute: 0.8 10*3/uL (ref 0.1–1.0)
Monocytes Relative: 9 %
NEUTROS PCT: 76 %
Neutro Abs: 6.3 10*3/uL (ref 1.7–7.7)

## 2016-02-28 LAB — I-STAT CHEM 8, ED
BUN: 21 mg/dL — ABNORMAL HIGH (ref 6–20)
CALCIUM ION: 1.18 mmol/L (ref 1.13–1.30)
Chloride: 94 mmol/L — ABNORMAL LOW (ref 101–111)
Creatinine, Ser: 1.6 mg/dL — ABNORMAL HIGH (ref 0.61–1.24)
Glucose, Bld: 142 mg/dL — ABNORMAL HIGH (ref 65–99)
HCT: 51 % (ref 39.0–52.0)
HEMOGLOBIN: 17.3 g/dL — AB (ref 13.0–17.0)
Potassium: 4 mmol/L (ref 3.5–5.1)
SODIUM: 137 mmol/L (ref 135–145)
TCO2: 30 mmol/L (ref 0–100)

## 2016-02-28 LAB — COMPREHENSIVE METABOLIC PANEL
ALK PHOS: 83 U/L (ref 38–126)
ALT: 18 U/L (ref 17–63)
ANION GAP: 11 (ref 5–15)
AST: 19 U/L (ref 15–41)
Albumin: 3.8 g/dL (ref 3.5–5.0)
BILIRUBIN TOTAL: 0.6 mg/dL (ref 0.3–1.2)
BUN: 16 mg/dL (ref 6–20)
CALCIUM: 9.6 mg/dL (ref 8.9–10.3)
CO2: 31 mmol/L (ref 22–32)
Chloride: 96 mmol/L — ABNORMAL LOW (ref 101–111)
Creatinine, Ser: 1.7 mg/dL — ABNORMAL HIGH (ref 0.61–1.24)
GFR, EST AFRICAN AMERICAN: 41 mL/min — AB (ref >60)
GFR, EST NON AFRICAN AMERICAN: 35 mL/min — AB (ref >60)
Glucose, Bld: 141 mg/dL — ABNORMAL HIGH (ref 65–99)
POTASSIUM: 3.9 mmol/L (ref 3.5–5.1)
Sodium: 138 mmol/L (ref 135–145)
TOTAL PROTEIN: 6.2 g/dL — AB (ref 6.5–8.1)

## 2016-02-28 LAB — CBC
HCT: 46.5 % (ref 39.0–52.0)
HEMOGLOBIN: 15.4 g/dL (ref 13.0–17.0)
MCH: 28.6 pg (ref 26.0–34.0)
MCHC: 33.1 g/dL (ref 30.0–36.0)
MCV: 86.4 fL (ref 78.0–100.0)
PLATELETS: 190 10*3/uL (ref 150–400)
RBC: 5.38 MIL/uL (ref 4.22–5.81)
RDW: 14 % (ref 11.5–15.5)
WBC: 8.3 10*3/uL (ref 4.0–10.5)

## 2016-02-28 LAB — APTT: APTT: 29 s (ref 24–37)

## 2016-02-28 LAB — I-STAT TROPONIN, ED: TROPONIN I, POC: 0.01 ng/mL (ref 0.00–0.08)

## 2016-02-28 LAB — PROTIME-INR
INR: 1.03 (ref 0.00–1.49)
PROTHROMBIN TIME: 13.7 s (ref 11.6–15.2)

## 2016-02-28 LAB — GLUCOSE, CAPILLARY: Glucose-Capillary: 107 mg/dL — ABNORMAL HIGH (ref 65–99)

## 2016-02-28 MED ORDER — HYDROCODONE-ACETAMINOPHEN 5-325 MG PO TABS
1.0000 | ORAL_TABLET | ORAL | Status: DC | PRN
  Administered 2016-02-28 – 2016-02-29 (×2): 1 via ORAL
  Filled 2016-02-28: qty 1

## 2016-02-28 MED ORDER — VITAMIN D 1000 UNITS PO TABS
2000.0000 [IU] | ORAL_TABLET | Freq: Every day | ORAL | Status: DC
  Administered 2016-02-28 – 2016-03-01 (×3): 2000 [IU] via ORAL
  Filled 2016-02-28 (×3): qty 2

## 2016-02-28 MED ORDER — METOPROLOL SUCCINATE ER 25 MG PO TB24
25.0000 mg | ORAL_TABLET | Freq: Every day | ORAL | Status: DC
  Administered 2016-02-28 – 2016-02-29 (×2): 25 mg via ORAL
  Filled 2016-02-28 (×2): qty 1

## 2016-02-28 MED ORDER — ASPIRIN EC 81 MG PO TBEC
81.0000 mg | DELAYED_RELEASE_TABLET | Freq: Every day | ORAL | Status: DC
  Administered 2016-02-28 – 2016-02-29 (×2): 81 mg via ORAL
  Filled 2016-02-28 (×2): qty 1

## 2016-02-28 MED ORDER — B COMPLEX-C PO TABS
1.0000 | ORAL_TABLET | Freq: Every day | ORAL | Status: DC
  Administered 2016-02-29 – 2016-03-01 (×2): 1 via ORAL
  Filled 2016-02-28 (×4): qty 1

## 2016-02-28 MED ORDER — CLOPIDOGREL BISULFATE 75 MG PO TABS
75.0000 mg | ORAL_TABLET | Freq: Once | ORAL | Status: AC
  Administered 2016-02-28: 75 mg via ORAL
  Filled 2016-02-28: qty 1

## 2016-02-28 MED ORDER — B COMPLEX PO TABS: 1.0000 | ORAL_TABLET | Freq: Every day | ORAL | Status: DC

## 2016-02-28 MED ORDER — GABAPENTIN 600 MG PO TABS
600.0000 mg | ORAL_TABLET | Freq: Every day | ORAL | Status: DC
  Administered 2016-02-28 – 2016-03-01 (×3): 600 mg via ORAL
  Filled 2016-02-28 (×3): qty 1

## 2016-02-28 MED ORDER — TAMSULOSIN HCL 0.4 MG PO CAPS
0.4000 mg | ORAL_CAPSULE | Freq: Every day | ORAL | Status: DC
  Administered 2016-02-28 – 2016-03-01 (×3): 0.4 mg via ORAL
  Filled 2016-02-28 (×3): qty 1

## 2016-02-28 MED ORDER — HYDRALAZINE HCL 50 MG PO TABS
100.0000 mg | ORAL_TABLET | Freq: Two times a day (BID) | ORAL | Status: DC
  Administered 2016-02-28 – 2016-03-01 (×4): 100 mg via ORAL
  Filled 2016-02-28 (×4): qty 2

## 2016-02-28 MED ORDER — HYDROCHLOROTHIAZIDE 25 MG PO TABS
25.0000 mg | ORAL_TABLET | Freq: Every day | ORAL | Status: DC
  Administered 2016-02-28 – 2016-03-01 (×3): 25 mg via ORAL
  Filled 2016-02-28 (×3): qty 1

## 2016-02-28 MED ORDER — HEPARIN SODIUM (PORCINE) 5000 UNIT/ML IJ SOLN
5000.0000 [IU] | Freq: Three times a day (TID) | INTRAMUSCULAR | Status: DC
  Administered 2016-02-28 – 2016-02-29 (×2): 5000 [IU] via SUBCUTANEOUS
  Filled 2016-02-28 (×2): qty 1

## 2016-02-28 MED ORDER — CITALOPRAM HYDROBROMIDE 20 MG PO TABS
20.0000 mg | ORAL_TABLET | Freq: Every day | ORAL | Status: DC
  Administered 2016-02-28 – 2016-02-29 (×2): 20 mg via ORAL
  Filled 2016-02-28 (×2): qty 1

## 2016-02-28 MED ORDER — AMLODIPINE BESYLATE 10 MG PO TABS
10.0000 mg | ORAL_TABLET | Freq: Every evening | ORAL | Status: DC
  Administered 2016-02-28 – 2016-03-01 (×3): 10 mg via ORAL
  Filled 2016-02-28: qty 2
  Filled 2016-02-28 (×2): qty 1

## 2016-02-28 MED ORDER — TOPIRAMATE 25 MG PO TABS
25.0000 mg | ORAL_TABLET | Freq: Two times a day (BID) | ORAL | Status: DC
  Administered 2016-02-28 – 2016-03-01 (×4): 25 mg via ORAL
  Filled 2016-02-28 (×4): qty 1

## 2016-02-28 MED ORDER — ASPIRIN EC 81 MG PO TBEC
81.0000 mg | DELAYED_RELEASE_TABLET | Freq: Once | ORAL | Status: AC
  Administered 2016-02-28: 81 mg via ORAL
  Filled 2016-02-28: qty 1

## 2016-02-28 MED ORDER — PREDNISONE 20 MG PO TABS: 100.0000 mg | ORAL_TABLET | Freq: Every day | ORAL | Status: DC

## 2016-02-28 MED ORDER — LOSARTAN POTASSIUM 50 MG PO TABS
100.0000 mg | ORAL_TABLET | ORAL | Status: DC
  Administered 2016-02-29 – 2016-03-01 (×2): 100 mg via ORAL
  Filled 2016-02-28 (×2): qty 2

## 2016-02-28 MED ORDER — INSULIN ASPART 100 UNIT/ML ~~LOC~~ SOLN
0.0000 [IU] | Freq: Three times a day (TID) | SUBCUTANEOUS | Status: DC
  Administered 2016-02-29: 1 [IU] via SUBCUTANEOUS

## 2016-02-28 MED ORDER — BUPROPION HCL ER (XL) 300 MG PO TB24
300.0000 mg | ORAL_TABLET | Freq: Every day | ORAL | Status: DC
  Administered 2016-02-28 – 2016-03-01 (×3): 300 mg via ORAL
  Filled 2016-02-28 (×5): qty 1

## 2016-02-28 NOTE — ED Notes (Signed)
Remains in MRI at this time 

## 2016-02-28 NOTE — Progress Notes (Signed)
Arrived from Ed. Alert and oriented. Denies any pain. Oriented to room. Call bell within reach. Will continue to monitor.

## 2016-02-28 NOTE — ED Notes (Signed)
Returns from MRI at this time

## 2016-02-28 NOTE — ED Notes (Signed)
PT requests headache medicine

## 2016-02-28 NOTE — ED Notes (Signed)
Hin, RN has not yet been informed about PT arrival. Sec requests call back in 15 minutes

## 2016-02-28 NOTE — ED Provider Notes (Signed)
CSN: [id number]     Arrival date & time 02/28/16  [id number] History   First MD Initiated Contact with Patient 02/28/16 1146     Chief Complaint  Patient presents with  . Numbness  . Weakness  . Dizziness    HPI   82 y/o presenting for episodic diffuse body numbness and tingling but started last night. Of note he has chronic dizziness and headache which he is followed by neurology. He presented to his neurologist's office this morning who asked him to presents emergency department for evaluation of his diffuse numbness. He denies chest pain, shortness of breath, palpitations associated with these episodes. He denies weakness associated as well. He continues to have episodic sharp shooting headaches which neurology follows as well. However he reports mild headache located over the top of his head over the last week. He has not tried any medication for this.  Otherwise he denies recent illnesses, fevers, changes in vision, nausea, vomiting, diarrhea, abdominal pain.   Past Medical History  Diagnosis Date  . High cholesterol   . Diabetes mellitus     tingling in feet  . Arthritis   . GERD (gastroesophageal reflux disease)   . H/O hiatal hernia   . Enlarged prostate     laser surgery  . Hypertension     pcp  dr Jeneen Rinks [place]  [place]  . Cancer ([place])     skin ca removed  . Anxiety   . Allergic state   . Abdominal aortic aneurysm (AAA) ([place])   . COPD (chronic obstructive pulmonary disease) ([place])   . Depression   . Kidney stones   . Melanoma ([employer])   . Renal artery aneurysm [facility])    Past Surgical History  Procedure Laterality Date  . Hernia repair    . Kidney stone surgery    . Abdominal aortic aneurysm repair    . Back surgery      lumbar  ,cervical surgeries  . Lumbar laminectomy/decompression microdiscectomy  12/10/2011    Procedure: LUMBAR LAMINECTOMY/DECOMPRESSION MICRODISCECTOMY;  Surgeon: Ophelia Charter;  Location: [facility] NEURO ORS;  Service: Neurosurgery;;  Thoracic  One-Two Laminectomy  . Esophagogastroduodenoscopy (egd) with propofol N/A 02/11/2016    Procedure: ESOPHAGOGASTRODUODENOSCOPY (EGD) WITH PROPOFOL;  Surgeon: Hulen Luster, MD;  Location: [facility] ENDOSCOPY;  Service: Gastroenterology;  Laterality: N/A;   Family History  Problem Relation Age of Onset  . Anesthesia problems Neg Hx   . Hypotension Neg Hx   . Malignant hyperthermia Neg Hx   . Pseudochol deficiency Neg Hx   . Heart Problems Mother   . Tuberculosis Father    Social History  Substance Use Topics  . Smoking status: Former Smoker    Types: Pipe  . Smokeless tobacco: Never Used  . Alcohol Use: Yes     Comment: occ 1-2 beers a month    Review of Systems  Constitutional: Negative.   Eyes: Negative.   Respiratory: Negative.   Cardiovascular: Negative.   Gastrointestinal: Negative.   Genitourinary: Negative.   Skin: Negative.   Neurological: Positive for dizziness and headaches. Negative for tremors, seizures, syncope, facial asymmetry, speech difficulty, weakness, light-headedness and numbness.      Allergies  Statins  Home Medications   Prior to Admission medications   Medication Sig Start Date End Date Taking? Authorizing Provider  ALPRAZolam Duanne Moron) 0.25 MG tablet Take 0.25 tablets by mouth as needed. 01/02/14  Yes Historical Provider, MD  amLODipine (NORVASC) 10 MG tablet Take 10 mg by mouth every evening.  Yes Historical Provider, MD  aspirin EC 81 MG tablet Take 81 mg by mouth at bedtime.     Yes Historical Provider, MD  b complex vitamins tablet Take 1 tablet by mouth daily.   Yes Historical Provider, MD  buPROPion (WELLBUTRIN XL) 300 MG 24 hr tablet Take 300 mg by mouth daily.   Yes Historical Provider, MD  Cholecalciferol (VITAMIN D3 SUPER STRENGTH) 2000 UNITS TABS Take 2,000 Units by mouth daily.     Yes Historical Provider, MD  citalopram (CELEXA) 20 MG tablet Take 20 mg by mouth at bedtime.    Yes Historical Provider, MD  gabapentin (NEURONTIN) 600 MG tablet  Take 1 tablet by mouth daily. 09/26/13  Yes Historical Provider, MD  glipiZIDE (GLUCOTROL XL) 2.5 MG 24 hr tablet Take 2.5 mg by mouth at bedtime.     Yes Historical Provider, MD  hydrALAZINE (APRESOLINE) 100 MG tablet Take 1 tablet by mouth 2 (two) times daily. 11/02/13  Yes Historical Provider, MD  hydrochlorothiazide (HYDRODIURIL) 25 MG tablet Take 25 mg by mouth daily.   Yes Historical Provider, MD  HYDROcodone-acetaminophen (NORCO/VICODIN) 5-325 MG per tablet Take 1 tablet by mouth as needed. 10/25/13  Yes Historical Provider, MD  losartan (COZAAR) 100 MG tablet Take 100 mg by mouth every morning.     Yes Historical Provider, MD  magnesium oxide (MAG-OX) 400 MG tablet Take 400 mg by mouth daily.   Yes Historical Provider, MD  metoprolol succinate (TOPROL-XL) 25 MG 24 hr tablet Take 25 mg by mouth at bedtime.     Yes Historical Provider, MD  potassium chloride SA (K-DUR,KLOR-CON) 20 MEQ tablet Take 20 mEq by mouth every morning.     Yes Historical Provider, MD  predniSONE (DELTASONE) 10 MG tablet Take 10 tablets by mouth daily. 01/23/14  Yes Historical Provider, MD  tamsulosin (FLOMAX) 0.4 MG CAPS capsule Take 0.4 mg by mouth daily.    Yes Historical Provider, MD  topiramate (TOPAMAX) 25 MG tablet Take 1 tablet (25 mg total) by mouth 2 (two) times daily. 01/31/14  Yes Garvin Fila, MD  hydrALAZINE (APRESOLINE) 100 MG tablet Take 1 tablet (100 mg total) by mouth 3 (three) times daily. 11/20/11 01/31/14  Crecencio Mc, MD   BP 149/67 mmHg  Pulse 77  Temp(Src) 98.7 F (37.1 C) (Oral)  Resp 19  Wt 100.245 kg  SpO2 99% Physical Exam  Constitutional: He is oriented to person, place, and time. He appears well-developed and well-nourished.  HENT:  Head: Normocephalic.  Eyes: EOM are normal. Pupils are equal, round, and reactive to light.  Cardiovascular: Normal rate and regular rhythm.   Pulmonary/Chest: Effort normal.  Abdominal: Soft. Bowel sounds are normal. He exhibits no distension. There  is no tenderness.  Neurological: He is alert and oriented to person, place, and time.  Skin: Skin is warm and dry.  Cranial Nerves II - XII - II - Visual field intact OU. III, IV, VI - Extraocular movements intact. V - Facial sensation intact bilaterally. VII - Facial movement intact bilaterally. VIII - chronic left ear hearing deficit X - Palate elevates symmetrically, no dysarthria. XI - Chin turning & shoulder shrug intact bilaterally. XII - Tongue protrusion intact.  Motor Strength - The patient's strength was 5/5 left upper and loer extremities, 3-4/5 in left upper and lower extremities, pronator drift was absent. Bulk was normal.   Motor Tone - Muscle tone was assessed at the neck and appendages and was normal.  Cerebellar: No ataxia on  finger-nose-finger bilaterally  Coordination - The patient had normal movements in the hands with no ataxia or dysmetria. Bilateral intention tremor. + Romberg  Gait and Station - deferred due to safety concerns.   ED Course  Procedures (including critical care time) Labs Review Labs Reviewed  COMPREHENSIVE METABOLIC PANEL - Abnormal; Notable for the following:    Chloride 96 (*)    Glucose, Bld 141 (*)    Creatinine, Ser 1.70 (*)    Total Protein 6.2 (*)    GFR calc non Af Amer 35 (*)    GFR calc Af Amer 41 (*)    All other components within normal limits  I-STAT CHEM 8, ED - Abnormal; Notable for the following:    Chloride 94 (*)    BUN 21 (*)    Creatinine, Ser 1.60 (*)    Glucose, Bld 142 (*)    Hemoglobin 17.3 (*)    All other components within normal limits  PROTIME-INR  APTT  CBC  DIFFERENTIAL  I-STAT TROPOININ, ED    Imaging Review Ct Head Wo Contrast  02/28/2016  CLINICAL DATA:  Few episodes of whole-body tingling since yesterday, funny feeling, dizziness, unsteady gait, history hypercholesterolemia, diabetes mellitus, hypertension, COPD, melanoma, former smoker EXAM: CT HEAD WITHOUT CONTRAST TECHNIQUE: Contiguous  axial images were obtained from the base of the skull through the vertex without intravenous contrast. COMPARISON:  07/27/2013 FINDINGS: Generalized atrophy. Normal ventricular morphology. No midline shift or mass effect. Normal appearance of brain parenchyma. No intracranial hemorrhage, mass lesion or evidence acute infarction. No extra-axial fluid collections. Atherosclerotic calcifications at the carotid siphons. Bones and sinuses unremarkable. IMPRESSION: No acute intracranial abnormalities. Electronically Signed   By: Lavonia Dana M.D.   On: 02/28/2016 11:35   Mr Jodene Nam Head Wo Contrast  02/28/2016  CLINICAL DATA:  Left-sided weakness, headache, dizziness, diffuse numbness, and blurred vision. EXAM: MRI HEAD WITHOUT CONTRAST MRA HEAD WITHOUT CONTRAST TECHNIQUE: Multiplanar, multiecho pulse sequences of the brain and surrounding structures were obtained without intravenous contrast. Angiographic images of the head were obtained using MRA technique without contrast. COMPARISON:  Head CT 02/28/2016 and MRI 03/06/2015 FINDINGS: MRI HEAD FINDINGS There is no evidence of acute infarct, intracranial hemorrhage, mass, midline shift, or extra-axial fluid collection. Moderate cerebral atrophy is unchanged. Small foci of T2 hyperintensity throughout the subcortical and deep cerebral white matter bilaterally are similar to the prior MRI and nonspecific but compatible with mild chronic small vessel ischemic disease. Orbits are unremarkable. Paranasal sinuses and mastoid air cells are clear. The left vertebral artery appears dominant with a normal distal right vertebral artery flow void not clearly identified, see below. MRA HEAD FINDINGS The imaged intracranial portion of the left vertebral artery appears patent and supplies the basilar artery. The distal right vertebral artery is not well seen,, without significant flow related enhancement is identified. PICA origins were not imaged. SCA origins are patent. Basilar artery is  patent with a severe stenosis in its midportion. There are patent posterior communicating arteries bilaterally. The PCAs are mildly irregular are diffusely without significant proximal stenosis. The internal carotid arteries are patent from skullbase to carotid termini without stenosis. MCAs are patent without evidence of major branch occlusion or significant proximal stenosis. Mild left MCA branch vessel irregularity is noted. ACAs are patent without evidence of significant proximal stenosis. There is asymmetric attenuation of the left ACA from the mid A2 level distally. No intracranial aneurysm is identified. IMPRESSION: 1. No acute intracranial abnormality. 2. Mild chronic small vessel ischemic  disease and moderate cerebral atrophy. 3. Suspected occlusion (or severely diminished flow) of the distal right vertebral artery. 4. Severe mid basilar artery stenosis. 5. Anterior circulation branch vessel atherosclerosis without evidence of significant proximal stenosis. Electronically Signed   By: Logan Bores M.D.   On: 02/28/2016 15:19   Mr Brain Wo Contrast  02/28/2016  CLINICAL DATA:  Left-sided weakness, headache, dizziness, diffuse numbness, and blurred vision. EXAM: MRI HEAD WITHOUT CONTRAST MRA HEAD WITHOUT CONTRAST TECHNIQUE: Multiplanar, multiecho pulse sequences of the brain and surrounding structures were obtained without intravenous contrast. Angiographic images of the head were obtained using MRA technique without contrast. COMPARISON:  Head CT 02/28/2016 and MRI 03/06/2015 FINDINGS: MRI HEAD FINDINGS There is no evidence of acute infarct, intracranial hemorrhage, mass, midline shift, or extra-axial fluid collection. Moderate cerebral atrophy is unchanged. Small foci of T2 hyperintensity throughout the subcortical and deep cerebral white matter bilaterally are similar to the prior MRI and nonspecific but compatible with mild chronic small vessel ischemic disease. Orbits are unremarkable. Paranasal  sinuses and mastoid air cells are clear. The left vertebral artery appears dominant with a normal distal right vertebral artery flow void not clearly identified, see below. MRA HEAD FINDINGS The imaged intracranial portion of the left vertebral artery appears patent and supplies the basilar artery. The distal right vertebral artery is not well seen,, without significant flow related enhancement is identified. PICA origins were not imaged. SCA origins are patent. Basilar artery is patent with a severe stenosis in its midportion. There are patent posterior communicating arteries bilaterally. The PCAs are mildly irregular are diffusely without significant proximal stenosis. The internal carotid arteries are patent from skullbase to carotid termini without stenosis. MCAs are patent without evidence of major branch occlusion or significant proximal stenosis. Mild left MCA branch vessel irregularity is noted. ACAs are patent without evidence of significant proximal stenosis. There is asymmetric attenuation of the left ACA from the mid A2 level distally. No intracranial aneurysm is identified. IMPRESSION: 1. No acute intracranial abnormality. 2. Mild chronic small vessel ischemic disease and moderate cerebral atrophy. 3. Suspected occlusion (or severely diminished flow) of the distal right vertebral artery. 4. Severe mid basilar artery stenosis. 5. Anterior circulation branch vessel atherosclerosis without evidence of significant proximal stenosis. Electronically Signed   By: Logan Bores M.D.   On: 02/28/2016 15:19   I have personally reviewed and evaluated these images and lab results as part of my medical decision-making.   EKG Interpretation None      MDM   Final diagnoses:  Left-sided weakness  Basilar artery stenosis  Vertebral artery stenosis, right   82 y/o presenting for episodic diffuse numbness and tingling with left upper and lower extremity weakness on exam. CT head ws negative for acute  pathology. ECG  And troponin were negative for evidence of ischemia. MRI head showed severe basilar artery stenosis and right vertebral artery stenosis. Dr Patrecia Pour with neurology was consulted who recommended admission, initiation of aspirin and plavix and cerebral arteriogram in the AM.   Spoke to Internal Medicine teaching service who agreed to admission   Bianey Tesoro A. Lincoln Brigham MD, [facility] Resident PGY-2 Pager [phone]         Veatrice Bourbon, MD 02/28/16 [id number]  Leonard Schwartz, MD 02/28/16 [id number]

## 2016-02-28 NOTE — ED Notes (Signed)
Pt reports that he was at neurologist this morning for a headache, and told them he started feeling bad last night with numbness all over, weakness and dizziness. Also reports blurred vision. Pt A&Ox4 at triage.

## 2016-02-28 NOTE — H&P (Signed)
Date: 02/28/2016               Patient Name:  Gary Chang MRN: [id number]  DOB: September 14, 1928 Age / Sex: 82 y.o., male   PCP: Gary Pink, MD         Medical Service: Internal Medicine Teaching Service         Attending Physician: Dr. Truman Hayward, MD    First Contact: Dr. Blane Chang Pager: [phone]  Second Contact: Dr. Julious Chang Pager: [phone]       After Hours (After 5p/  First Contact Pager: [phone]  weekends / holidays): Second Contact Pager: [phone]   Chief Complaint: Numbness/tingling/weakness, dizziness, headaches  History of Present Illness: Gary Chang is an 82yo with T2DM, HTN, AAA s/p repair now with incisional hernia, and BPH who presents with diffuse numbness and tingling as well as weakness throughout his body that started last night and "comes and goes". He says he has also had generalized malaise since last night. In addition, he does have chronic dizziness and gait unsteadiness that has worsened acutely over the past 1 week. However, he denies any falls, syncope, vertigo, or vision changes. He continues to have intermittent sharp, shooting headaches that starts at the tips of his ears and moves to the top of his head that occur 1-2 times/day. There is also a residual dull pain at the top of his head that he feels is getting worse over the past week. He denies any fevers, vision changes, lightheadedness, syncope, falls, slurred speech, chest pain, shortness of breath, abdominal pain, nausea, vomiting, urinary incontinence, polyuria, dysuria, diarrhea, rashes, sick contacts, or other symptoms at this time.  Meds: Current Facility-Administered Medications  Medication Dose Route Frequency Provider Last Rate Last Dose  . amLODipine (NORVASC) tablet 10 mg  10 mg Oral QPM Gary Herrlich, MD      . aspirin EC tablet 81 mg  81 mg Oral QHS Gary Herrlich, MD      . b complex vitamins tablet 1 tablet  1 tablet Oral Daily Gary Herrlich, MD      . buPROPion  (WELLBUTRIN XL) 24 hr tablet 300 mg  300 mg Oral Daily Gary Herrlich, MD      . Cholecalciferol TABS 2,000 Units  2,000 Units Oral Daily Gary Herrlich, MD      . citalopram (CELEXA) tablet 20 mg  20 mg Oral QHS Gary Herrlich, MD      . gabapentin (NEURONTIN) tablet 600 mg  600 mg Oral Daily Gary Herrlich, MD      . heparin injection 5,000 Units  5,000 Units Subcutaneous 3 times per day Gary Herrlich, MD      . hydrALAZINE (APRESOLINE) tablet 100 mg  100 mg Oral BID Gary Herrlich, MD      . hydrochlorothiazide (HYDRODIURIL) tablet 25 mg  25 mg Oral Daily Gary Herrlich, MD      . HYDROcodone-acetaminophen (NORCO/VICODIN) 5-325 MG per tablet 1 tablet  1 tablet Oral Q4H PRN Gary Herrlich, MD      . losartan (COZAAR) tablet 100 mg  100 mg Oral BH-q7a Gary Herrlich, MD      . metoprolol succinate (TOPROL-XL) 24 hr tablet 25 mg  25 mg Oral QHS Gary Herrlich, MD      . predniSONE (DELTASONE) tablet 100 mg  100 mg Oral Daily Gary Herrlich, MD      . tamsulosin [facility]) capsule  0.4 mg  0.4 mg Oral Daily Gary Herrlich, MD      . topiramate (TOPAMAX) tablet 25 mg  25 mg Oral BID Gary Herrlich, MD       Current Outpatient Prescriptions  Medication Sig Dispense Refill  . ALPRAZolam (XANAX) 0.25 MG tablet Take 0.25 tablets by mouth as needed.    Marland Kitchen amLODipine (NORVASC) 10 MG tablet Take 10 mg by mouth every evening.      Marland Kitchen aspirin EC 81 MG tablet Take 81 mg by mouth at bedtime.      Marland Kitchen b complex vitamins tablet Take 1 tablet by mouth daily.    Marland Kitchen buPROPion (WELLBUTRIN XL) 300 MG 24 hr tablet Take 300 mg by mouth daily.    . Cholecalciferol (VITAMIN D3 SUPER STRENGTH) 2000 UNITS TABS Take 2,000 Units by mouth daily.      . citalopram (CELEXA) 20 MG tablet Take 20 mg by mouth at bedtime.     . gabapentin (NEURONTIN) 600 MG tablet Take 1 tablet by mouth daily.    Marland Kitchen glipiZIDE (GLUCOTROL XL) 2.5 MG 24 hr tablet Take 2.5 mg by mouth at bedtime.      . hydrALAZINE (APRESOLINE) 100 MG tablet Take 1  tablet by mouth 2 (two) times daily.    . hydrochlorothiazide (HYDRODIURIL) 25 MG tablet Take 25 mg by mouth daily.    Marland Kitchen HYDROcodone-acetaminophen (NORCO/VICODIN) 5-325 MG per tablet Take 1 tablet by mouth as needed.    Marland Kitchen losartan (COZAAR) 100 MG tablet Take 100 mg by mouth every morning.      . magnesium oxide (MAG-OX) 400 MG tablet Take 400 mg by mouth daily.    . metoprolol succinate (TOPROL-XL) 25 MG 24 hr tablet Take 25 mg by mouth at bedtime.      . potassium chloride SA (K-DUR,KLOR-CON) 20 MEQ tablet Take 20 mEq by mouth every morning.      . predniSONE (DELTASONE) 10 MG tablet Take 10 tablets by mouth daily.    . tamsulosin (FLOMAX) 0.4 MG CAPS capsule Take 0.4 mg by mouth daily.     Marland Kitchen topiramate (TOPAMAX) 25 MG tablet Take 1 tablet (25 mg total) by mouth 2 (two) times daily. 60 tablet 3  . hydrALAZINE (APRESOLINE) 100 MG tablet Take 1 tablet (100 mg total) by mouth 3 (three) times daily. 90 tablet 1    Allergies: Allergies as of 02/28/2016 - Review Complete 02/28/2016  Allergen Reaction Noted  . Statins Other (See Comments) 12/08/2011   Past Medical History  Diagnosis Date  . High cholesterol   . Diabetes mellitus     tingling in feet  . Arthritis   . GERD (gastroesophageal reflux disease)   . H/O hiatal hernia   . Enlarged prostate     laser surgery  . Hypertension     pcp  dr Jeneen Rinks [place]  [place]  . Cancer ([place])     skin ca removed  . Anxiety   . Allergic state   . Abdominal aortic aneurysm (AAA) ([place])   . COPD (chronic obstructive pulmonary disease) ([place])   . Depression   . Kidney stones   . Melanoma ([place])   . Renal artery aneurysm [facility])    Past Surgical History  Procedure Laterality Date  . Hernia repair    . Kidney stone surgery    . Abdominal aortic aneurysm repair    . Back surgery      lumbar  ,cervical surgeries  . Lumbar laminectomy/decompression microdiscectomy  12/10/2011  Procedure: LUMBAR LAMINECTOMY/DECOMPRESSION MICRODISCECTOMY;   Surgeon: Gary Charter;  Location: [facility] NEURO ORS;  Service: Neurosurgery;;  Thoracic One-Two Laminectomy  . Esophagogastroduodenoscopy (egd) with propofol N/A 02/11/2016    Procedure: ESOPHAGOGASTRODUODENOSCOPY (EGD) WITH PROPOFOL;  Surgeon: Hulen Luster, MD;  Location: [facility] ENDOSCOPY;  Service: Gastroenterology;  Laterality: N/A;   Family History  Problem Relation Age of Onset  . Anesthesia problems Neg Hx   . Hypotension Neg Hx   . Malignant hyperthermia Neg Hx   . Pseudochol deficiency Neg Hx   . Heart Problems Mother   . Tuberculosis Father    Social History   Social History  . Marital Status: Married    Spouse Name: [name]  . Number of Children: 2  . Years of Education: HS   Occupational History  . Retired    Social History Main Topics  . Smoking status: Former Smoker    Types: Pipe  . Smokeless tobacco: Never Used  . Alcohol Use: Yes     Comment: occ 1-2 beers a month  . Drug Use: No  . Sexual Activity: Not on file   Other Topics Concern  . Not on file   Social History Narrative   Patient lives at home with his spouse.   Caffeine Use: 1-2 cups daily    Review of Systems: Pertinent items noted in HPI and remainder of comprehensive ROS otherwise negative.  Physical Exam: Blood pressure 164/79, pulse 72, temperature 98.7 F (37.1 C), temperature source Oral, resp. rate 15, weight 221 lb (100.245 kg), SpO2 96 %.   Gen: Well-appearing, alert and oriented to person, place, and time HEENT: Oropharynx clear without erythema or exudate.  Neck: No cervical LAD, no thyromegaly or nodules, no JVD noted. CV: Normal rate, regular rhythm, no murmurs, rubs, or gallops Pulmonary: Normal effort, CTA bilaterally, no crackles or wheezes Abdominal: Soft, non-tender, non-distended, without rebound, guarding, or masses Extremities: Distal pulses 2+ in upper and lower extremities bilaterally, no tenderness, erythema or edema Neuro: CN II-XII grossly intact, no focal weakness or  sensory deficits noted. Tremulous on finger-to-nose testing bilaterally. No heel-to-shin dysmetria. No dysdiadochokinesia. Reflexes 2+ throughout, symmetric. Skin: No atypical appearing moles. No rashes  Lab results: Basic Metabolic Panel:  Recent Labs  02/28/16 1015 02/28/16 1028  NA 138 137  K 3.9 4.0  CL 96* 94*  CO2 31  --   GLUCOSE 141* 142*  BUN 16 21*  CREATININE 1.70* 1.60*  CALCIUM 9.6  --    Liver Function Tests:  Recent Labs  02/28/16 1015  AST 19  ALT 18  ALKPHOS 83  BILITOT 0.6  PROT 6.2*  ALBUMIN 3.8   CBC:  Recent Labs  02/28/16 1015 02/28/16 1028  WBC 8.3  --   NEUTROABS 6.3  --   HGB 15.4 17.3*  HCT 46.5 51.0  MCV 86.4  --   PLT 190  --    Imaging results:  Ct Head Wo Contrast  02/28/2016  CLINICAL DATA:  Few episodes of whole-body tingling since yesterday, funny feeling, dizziness, unsteady gait, history hypercholesterolemia, diabetes mellitus, hypertension, COPD, melanoma, former smoker EXAM: CT HEAD WITHOUT CONTRAST TECHNIQUE: Contiguous axial images were obtained from the base of the skull through the vertex without intravenous contrast. COMPARISON:  07/27/2013 FINDINGS: Generalized atrophy. Normal ventricular morphology. No midline shift or mass effect. Normal appearance of brain parenchyma. No intracranial hemorrhage, mass lesion or evidence acute infarction. No extra-axial fluid collections. Atherosclerotic calcifications at the carotid siphons. Bones and sinuses unremarkable. IMPRESSION:  No acute intracranial abnormalities. Electronically Signed   By: Lavonia Dana M.D.   On: 02/28/2016 11:35   Mr Jodene Nam Head Wo Contrast  02/28/2016  CLINICAL DATA:  Left-sided weakness, headache, dizziness, diffuse numbness, and blurred vision. EXAM: MRI HEAD WITHOUT CONTRAST MRA HEAD WITHOUT CONTRAST TECHNIQUE: Multiplanar, multiecho pulse sequences of the brain and surrounding structures were obtained without intravenous contrast. Angiographic images of the head  were obtained using MRA technique without contrast. COMPARISON:  Head CT 02/28/2016 and MRI 03/06/2015 FINDINGS: MRI HEAD FINDINGS There is no evidence of acute infarct, intracranial hemorrhage, mass, midline shift, or extra-axial fluid collection. Moderate cerebral atrophy is unchanged. Small foci of T2 hyperintensity throughout the subcortical and deep cerebral white matter bilaterally are similar to the prior MRI and nonspecific but compatible with mild chronic small vessel ischemic disease. Orbits are unremarkable. Paranasal sinuses and mastoid air cells are clear. The left vertebral artery appears dominant with a normal distal right vertebral artery flow void not clearly identified, see below. MRA HEAD FINDINGS The imaged intracranial portion of the left vertebral artery appears patent and supplies the basilar artery. The distal right vertebral artery is not well seen,, without significant flow related enhancement is identified. PICA origins were not imaged. SCA origins are patent. Basilar artery is patent with a severe stenosis in its midportion. There are patent posterior communicating arteries bilaterally. The PCAs are mildly irregular are diffusely without significant proximal stenosis. The internal carotid arteries are patent from skullbase to carotid termini without stenosis. MCAs are patent without evidence of major branch occlusion or significant proximal stenosis. Mild left MCA branch vessel irregularity is noted. ACAs are patent without evidence of significant proximal stenosis. There is asymmetric attenuation of the left ACA from the mid A2 level distally. No intracranial aneurysm is identified. IMPRESSION: 1. No acute intracranial abnormality. 2. Mild chronic small vessel ischemic disease and moderate cerebral atrophy. 3. Suspected occlusion (or severely diminished flow) of the distal right vertebral artery. 4. Severe mid basilar artery stenosis. 5. Anterior circulation branch vessel atherosclerosis  without evidence of significant proximal stenosis. Electronically Signed   By: Logan Bores M.D.   On: 02/28/2016 15:19   Mr Brain Wo Contrast  02/28/2016  CLINICAL DATA:  Left-sided weakness, headache, dizziness, diffuse numbness, and blurred vision. EXAM: MRI HEAD WITHOUT CONTRAST MRA HEAD WITHOUT CONTRAST TECHNIQUE: Multiplanar, multiecho pulse sequences of the brain and surrounding structures were obtained without intravenous contrast. Angiographic images of the head were obtained using MRA technique without contrast. COMPARISON:  Head CT 02/28/2016 and MRI 03/06/2015 FINDINGS: MRI HEAD FINDINGS There is no evidence of acute infarct, intracranial hemorrhage, mass, midline shift, or extra-axial fluid collection. Moderate cerebral atrophy is unchanged. Small foci of T2 hyperintensity throughout the subcortical and deep cerebral white matter bilaterally are similar to the prior MRI and nonspecific but compatible with mild chronic small vessel ischemic disease. Orbits are unremarkable. Paranasal sinuses and mastoid air cells are clear. The left vertebral artery appears dominant with a normal distal right vertebral artery flow void not clearly identified, see below. MRA HEAD FINDINGS The imaged intracranial portion of the left vertebral artery appears patent and supplies the basilar artery. The distal right vertebral artery is not well seen,, without significant flow related enhancement is identified. PICA origins were not imaged. SCA origins are patent. Basilar artery is patent with a severe stenosis in its midportion. There are patent posterior communicating arteries bilaterally. The PCAs are mildly irregular are diffusely without significant proximal stenosis. The internal carotid  arteries are patent from skullbase to carotid termini without stenosis. MCAs are patent without evidence of major branch occlusion or significant proximal stenosis. Mild left MCA branch vessel irregularity is noted. ACAs are patent  without evidence of significant proximal stenosis. There is asymmetric attenuation of the left ACA from the mid A2 level distally. No intracranial aneurysm is identified. IMPRESSION: 1. No acute intracranial abnormality. 2. Mild chronic small vessel ischemic disease and moderate cerebral atrophy. 3. Suspected occlusion (or severely diminished flow) of the distal right vertebral artery. 4. Severe mid basilar artery stenosis. 5. Anterior circulation branch vessel atherosclerosis without evidence of significant proximal stenosis. Electronically Signed   By: Logan Bores M.D.   On: 02/28/2016 15:19   Other results: EKG: normal EKG, normal sinus rhythm.  Assessment & Plan by Problem: 1. 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 Patient is without confusion, fevers, so infectious processes are less likely. Chronic, episodic symptoms are less suggestive of a CVA. EKG and troponins negative. CT head negative. 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 -Neurology on-board; appreciate the thoughtful care of the mutual patient -ASA, clopidogrel -Cerebral angiogram tomorrow AM -Continue home topiramate, PRN norco  2. HTN -Continue home amlodipine, hydralazine, HCTZ, losartan, metoprolol  3. DM2 -Continue gabapentin  4. BPH -Continue flomax  PPX: Heparin  Dispo: Disposition is deferred at this time, awaiting improvement of current medical problems. Anticipated discharge in approximately 1-3 day(s).   The patient does have a current PCP Gary Pink, MD) and does need an [facility] hospital follow-up appointment after discharge.  Signed: Norval Gable, MD 02/28/2016, 5:59 PM

## 2016-02-29 ENCOUNTER — Inpatient Hospital Stay (HOSPITAL_COMMUNITY): Payer: PPO

## 2016-02-29 DIAGNOSIS — I10 Essential (primary) hypertension: Secondary | ICD-10-CM

## 2016-02-29 DIAGNOSIS — I6509 Occlusion and stenosis of unspecified vertebral artery: Secondary | ICD-10-CM | POA: Insufficient documentation

## 2016-02-29 DIAGNOSIS — R42 Dizziness and giddiness: Secondary | ICD-10-CM | POA: Diagnosis not present

## 2016-02-29 DIAGNOSIS — I651 Occlusion and stenosis of basilar artery: Secondary | ICD-10-CM | POA: Diagnosis not present

## 2016-02-29 DIAGNOSIS — N400 Enlarged prostate without lower urinary tract symptoms: Secondary | ICD-10-CM

## 2016-02-29 DIAGNOSIS — I6501 Occlusion and stenosis of right vertebral artery: Secondary | ICD-10-CM | POA: Diagnosis not present

## 2016-02-29 DIAGNOSIS — R531 Weakness: Secondary | ICD-10-CM | POA: Insufficient documentation

## 2016-02-29 DIAGNOSIS — R200 Anesthesia of skin: Secondary | ICD-10-CM | POA: Diagnosis not present

## 2016-02-29 DIAGNOSIS — E119 Type 2 diabetes mellitus without complications: Secondary | ICD-10-CM

## 2016-02-29 DIAGNOSIS — R2681 Unsteadiness on feet: Secondary | ICD-10-CM | POA: Diagnosis not present

## 2016-02-29 DIAGNOSIS — M6289 Other specified disorders of muscle: Secondary | ICD-10-CM

## 2016-02-29 DIAGNOSIS — I6503 Occlusion and stenosis of bilateral vertebral arteries: Secondary | ICD-10-CM | POA: Diagnosis not present

## 2016-02-29 LAB — BASIC METABOLIC PANEL
ANION GAP: 13 (ref 5–15)
BUN: 13 mg/dL (ref 6–20)
CHLORIDE: 97 mmol/L — AB (ref 101–111)
CO2: 29 mmol/L (ref 22–32)
Calcium: 9.2 mg/dL (ref 8.9–10.3)
Creatinine, Ser: 1.65 mg/dL — ABNORMAL HIGH (ref 0.61–1.24)
GFR, EST AFRICAN AMERICAN: 42 mL/min — AB (ref >60)
GFR, EST NON AFRICAN AMERICAN: 36 mL/min — AB (ref >60)
Glucose, Bld: 120 mg/dL — ABNORMAL HIGH (ref 65–99)
POTASSIUM: 3.1 mmol/L — AB (ref 3.5–5.1)
SODIUM: 139 mmol/L (ref 135–145)

## 2016-02-29 LAB — GLUCOSE, CAPILLARY
GLUCOSE-CAPILLARY: 107 mg/dL — AB (ref 65–99)
Glucose-Capillary: 121 mg/dL — ABNORMAL HIGH (ref 65–99)
Glucose-Capillary: 114 mg/dL — ABNORMAL HIGH (ref 65–99)

## 2016-02-29 LAB — MAGNESIUM: Magnesium: 2 mg/dL (ref 1.7–2.4)

## 2016-02-29 LAB — VITAMIN B12: VITAMIN B 12: 686 pg/mL (ref 180–914)

## 2016-02-29 LAB — FOLATE: Folate: 27.8 ng/mL (ref >5.9)

## 2016-02-29 LAB — TSH: TSH: 1.002 u[IU]/mL (ref 0.350–4.500)

## 2016-02-29 MED ORDER — POTASSIUM CHLORIDE 10 MEQ/100ML IV SOLN
10.0000 meq | INTRAVENOUS | Status: AC
Start: 2016-02-29 — End: 2016-02-29
  Administered 2016-02-29 (×4): 10 meq via INTRAVENOUS
  Filled 2016-02-29: qty 100

## 2016-02-29 MED ORDER — POTASSIUM CHLORIDE 10 MEQ/100ML IV SOLN
INTRAVENOUS | Status: AC
  Administered 2016-02-29: 10 meq via INTRAVENOUS
  Filled 2016-02-29: qty 100

## 2016-02-29 MED ORDER — SIMETHICONE 80 MG PO CHEW
80.0000 mg | CHEWABLE_TABLET | Freq: Four times a day (QID) | ORAL | Status: DC | PRN
  Administered 2016-02-29: 80 mg via ORAL
  Filled 2016-02-29: qty 1

## 2016-02-29 MED ORDER — HYDROCODONE-ACETAMINOPHEN 5-325 MG PO TABS
ORAL_TABLET | ORAL | Status: AC
  Filled 2016-02-29: qty 1

## 2016-02-29 MED ORDER — HEPARIN SOD (PORK) LOCK FLUSH 100 UNIT/ML IV SOLN
INTRAVENOUS | Status: AC
  Filled 2016-02-29: qty 10

## 2016-02-29 MED ORDER — SODIUM CHLORIDE 0.9 % IV SOLN
INTRAVENOUS | Status: AC
  Administered 2016-02-29: 17:00:00 via INTRAVENOUS

## 2016-02-29 MED ORDER — HEPARIN SODIUM (PORCINE) 5000 UNIT/ML IJ SOLN
5000.0000 [IU] | Freq: Three times a day (TID) | INTRAMUSCULAR | Status: DC
  Administered 2016-03-01: 5000 [IU] via SUBCUTANEOUS
  Filled 2016-02-29: qty 1

## 2016-02-29 MED ORDER — HEPARIN SODIUM (PORCINE) 1000 UNIT/ML IJ SOLN
INTRAMUSCULAR | Status: AC | PRN
  Administered 2016-02-29: 1000 [IU] via INTRAVENOUS
  Administered 2016-02-29: 500 [IU] via INTRAVENOUS

## 2016-02-29 MED ORDER — FENTANYL CITRATE (PF) 100 MCG/2ML IJ SOLN
INTRAMUSCULAR | Status: AC
  Filled 2016-02-29: qty 2

## 2016-02-29 MED ORDER — LIDOCAINE HCL 1 % IJ SOLN
INTRAMUSCULAR | Status: AC
  Filled 2016-02-29: qty 20

## 2016-02-29 MED ORDER — LIDOCAINE HCL (PF) 1 % IJ SOLN: 20.0000 mL | Freq: Once | INTRAMUSCULAR | Status: DC

## 2016-02-29 MED ORDER — MIDAZOLAM HCL 2 MG/2ML IJ SOLN
INTRAMUSCULAR | Status: AC | PRN
  Administered 2016-02-29: 1 mg via INTRAVENOUS

## 2016-02-29 MED ORDER — FENTANYL CITRATE (PF) 100 MCG/2ML IJ SOLN
INTRAMUSCULAR | Status: AC | PRN
  Administered 2016-02-29: 25 ug via INTRAVENOUS

## 2016-02-29 MED ORDER — HEPARIN SOD (PORK) LOCK FLUSH 100 UNIT/ML IV SOLN
INTRAVENOUS | Status: AC
  Filled 2016-02-29: qty 5

## 2016-02-29 MED ORDER — MIDAZOLAM HCL 2 MG/2ML IJ SOLN
INTRAMUSCULAR | Status: AC
  Filled 2016-02-29: qty 2

## 2016-02-29 NOTE — Consult Note (Signed)
NEURO HOSPITALIST CONSULT NOTE   Requestig physician: Dr. Tommy Medal   Reason for Consult: admission for Cerebral angiogram   History obtained from:  Patient    HPI:                                                                                                                                          Gary Chang is an 82 y.o. male who was sent to ED after he was sent to ED after he mentioned his symptoms of sudden onset of whole body numbness and tingling two nights ago.  He has chronic tremor, decreased vision secondary to bilateral cataracts which he is set up to have surgery for, decreased hearing, bilateral LE neuropathy, loss of cervical lordosis and history of cervical fusion. HE states his gait has been off for a year, vision has been off for greater than 2 years, and at times he has a "woozy feeling" only when he get up from lying or sitting position. Currently has no complaint. In ED he had a MRA of head which showed a diminutive right vertebral artery and stenosis of his basilar. IR was called and they recommended he be admitted for cerebral angiogram. Currently he has a Cr of 1.7.   Past Medical History  Diagnosis Date  . High cholesterol   . Diabetes mellitus     tingling in feet  . Arthritis   . GERD (gastroesophageal reflux disease)   . H/O hiatal hernia   . Enlarged prostate     laser surgery  . Hypertension     pcp  dr Jeneen Rinks [place]  [place]  . Cancer ([place])     skin ca removed  . Anxiety   . Allergic state   . Abdominal aortic aneurysm (AAA) ([place])   . COPD (chronic obstructive pulmonary disease) ([place])   . Depression   . Kidney stones   . Melanoma ([place])   . Renal artery aneurysm [facility])     Past Surgical History  Procedure Laterality Date  . Hernia repair    . Kidney stone surgery    . Abdominal aortic aneurysm repair    . Back surgery      lumbar  ,cervical surgeries  . Lumbar laminectomy/decompression microdiscectomy  12/10/2011     Procedure: LUMBAR LAMINECTOMY/DECOMPRESSION MICRODISCECTOMY;  Surgeon: Ophelia Charter;  Location: [facility] NEURO ORS;  Service: Neurosurgery;;  Thoracic One-Two Laminectomy  . Esophagogastroduodenoscopy (egd) with propofol N/A 02/11/2016    Procedure: ESOPHAGOGASTRODUODENOSCOPY (EGD) WITH PROPOFOL;  Surgeon: Hulen Luster, MD;  Location: [facility] ENDOSCOPY;  Service: Gastroenterology;  Laterality: N/A;    Family History  Problem Relation Age of Onset  . Anesthesia problems Neg Hx   . Hypotension Neg Hx   . Malignant hyperthermia Neg Hx   .  Pseudochol deficiency Neg Hx   . Heart Problems Mother   . Tuberculosis Father      Social History:  reports that he has quit smoking. His smoking use included Pipe. He has never used smokeless tobacco. He reports that he drinks alcohol. He reports that he does not use illicit drugs.  Allergies  Allergen Reactions  . Statins Other (See Comments)    "Numbness and tingling"    MEDICATIONS:                                                                                                                     Prior to Admission:  Prescriptions prior to admission  Medication Sig Dispense Refill Last Dose  . ALPRAZolam (XANAX) 0.25 MG tablet Take 0.25 tablets by mouth as needed.   02/28/2016 at Unknown time  . amLODipine (NORVASC) 10 MG tablet Take 10 mg by mouth every evening.     02/27/2016 at Unknown time  . aspirin EC 81 MG tablet Take 81 mg by mouth at bedtime.     02/27/2016 at Unknown time  . b complex vitamins tablet Take 1 tablet by mouth daily.   02/27/2016 at Unknown time  . buPROPion (WELLBUTRIN XL) 300 MG 24 hr tablet Take 300 mg by mouth daily.   02/27/2016 at Unknown time  . Cholecalciferol (VITAMIN D3 SUPER STRENGTH) 2000 UNITS TABS Take 2,000 Units by mouth daily.     02/27/2016 at Unknown time  . citalopram (CELEXA) 20 MG tablet Take 20 mg by mouth at bedtime.    02/27/2016 at Unknown time  . gabapentin (NEURONTIN) 600 MG tablet Take 1 tablet by mouth daily.    02/27/2016 at Unknown time  . glipiZIDE (GLUCOTROL XL) 2.5 MG 24 hr tablet Take 2.5 mg by mouth at bedtime.     02/27/2016 at Unknown time  . hydrALAZINE (APRESOLINE) 100 MG tablet Take 1 tablet by mouth 2 (two) times daily.   02/27/2016 at Unknown time  . hydrochlorothiazide (HYDRODIURIL) 25 MG tablet Take 25 mg by mouth daily.   02/27/2016 at Unknown time  . HYDROcodone-acetaminophen (NORCO/VICODIN) 5-325 MG per tablet Take 1 tablet by mouth as needed.   02/28/2016 at Unknown time  . losartan (COZAAR) 100 MG tablet Take 100 mg by mouth every morning.     02/27/2016 at Unknown time  . magnesium oxide (MAG-OX) 400 MG tablet Take 400 mg by mouth daily.   02/27/2016 at Unknown time  . metoprolol succinate (TOPROL-XL) 25 MG 24 hr tablet Take 25 mg by mouth at bedtime.     02/27/2016 at 0800  . potassium chloride SA (K-DUR,KLOR-CON) 20 MEQ tablet Take 20 mEq by mouth every morning.     02/27/2016 at Unknown time  . predniSONE (DELTASONE) 10 MG tablet Take 10 tablets by mouth daily.   02/27/2016 at Unknown time  . tamsulosin (FLOMAX) 0.4 MG CAPS capsule Take 0.4 mg by mouth daily.    02/27/2016 at Unknown time  . topiramate (TOPAMAX) 25 MG tablet  Take 1 tablet (25 mg total) by mouth 2 (two) times daily. 60 tablet 3 02/27/2016 at Unknown time  . hydrALAZINE (APRESOLINE) 100 MG tablet Take 1 tablet (100 mg total) by mouth 3 (three) times daily. 90 tablet 1 12/10/2011 at Unknown   Scheduled: . amLODipine  10 mg Oral QPM  . aspirin EC  81 mg Oral QHS  . B-complex with vitamin C  1 tablet Oral Daily  . buPROPion  300 mg Oral Daily  . cholecalciferol  2,000 Units Oral Daily  . citalopram  20 mg Oral QHS  . gabapentin  600 mg Oral Daily  . heparin  5,000 Units Subcutaneous 3 times per day  . hydrALAZINE  100 mg Oral BID  . hydrochlorothiazide  25 mg Oral Daily  . insulin aspart  0-9 Units Subcutaneous TID WC  . losartan  100 mg Oral [id number]  . metoprolol succinate  25 mg Oral QHS  . potassium chloride  10 mEq Intravenous Q1  Hr x 4  . tamsulosin  0.4 mg Oral Daily  . topiramate  25 mg Oral BID     ROS:                                                                                                                                       History obtained from the patient  General ROS: negative for - chills, fatigue, fever, night sweats, weight gain or weight loss Psychological ROS: negative for - behavioral disorder, hallucinations, memory difficulties, mood swings or suicidal ideation Ophthalmic ROS: negative for - blurry vision, double vision, eye pain or loss of vision ENT ROS: negative for - epistaxis, nasal discharge, oral lesions, sore throat, tinnitus or vertigo Allergy and Immunology ROS: negative for - hives or itchy/watery eyes Hematological and Lymphatic ROS: negative for - bleeding problems, bruising or swollen lymph nodes Endocrine ROS: negative for - galactorrhea, hair pattern changes, polydipsia/polyuria or temperature intolerance Respiratory ROS: negative for - cough, hemoptysis, shortness of breath or wheezing Cardiovascular ROS: negative for - chest pain, dyspnea on exertion, edema or irregular heartbeat Gastrointestinal ROS: negative for - abdominal pain, diarrhea, hematemesis, nausea/vomiting or stool incontinence Genito-Urinary ROS: negative for - dysuria, hematuria, incontinence or urinary frequency/urgency Musculoskeletal ROS: negative for - joint swelling or muscular weakness Neurological ROS: as noted in HPI Dermatological ROS: negative for rash and skin lesion changes   Blood pressure 127/62, pulse 86, temperature 98.2 F (36.8 C), temperature source Oral, resp. rate 16, height 5\' 8"  (1.727 m), weight 95.346 kg (210 lb 3.2 oz), SpO2 94 %.   Neurologic Examination:  HEENT-  Normocephalic, no lesions, without obvious abnormality.  Normal external eye and conjunctiva.  Normal TM's  bilaterally.  Normal auditory canals and external ears. Normal external nose, mucus membranes and septum.  Normal pharynx. Cardiovascular- S1, S2 normal, pulses palpable throughout   Lungs- chest clear, no wheezing, rales, normal symmetric air entry Abdomen- normal findings: bowel sounds normal Extremities- no edema Lymph-no adenopathy palpable Musculoskeletal-no joint tenderness, deformity or swelling Skin-warm and dry, no hyperpigmentation, vitiligo, or suspicious lesions  Neurological Examination Mental Status: Alert, oriented, thought content appropriate.  Speech fluent without evidence of aphasia.  Able to follow 3 step commands without difficulty. Cranial Nerves: II: ; Visual fields grossly normal, pupils equal, round, reactive to light and accommodation--cataracts noted  III,IV, VI: ptosis not present, extra-ocular motions intact bilaterally V,VII: smile symmetric, facial light touch sensation normal bilaterally VIII: hearing decreased bilaterally IX,X: uvula rises symmetrically XI: bilateral shoulder shrug XII: midline tongue extension Motor: Right : Upper extremity   5/5    Left:     Upper extremity   5/5  Lower extremity   5/5     Lower extremity   5/5 Tone and bulk:normal tone throughout; no atrophy noted Sensory: Pinprick and light touch decreased from knee to feet. Intact proprioception, decreased vibratory and temperature from knee to feet.  Deep Tendon Reflexes: 2+ and symmetric throughout Plantars: Right: downgoing   Left: downgoing Cerebellar: normal finger-to-nose with both postural and intention tremor and normal heel-to-shin test Gait: not tested      Lab Results: Basic Metabolic Panel:  Recent Labs Lab 02/28/16 1015 02/28/16 1028 02/29/16 0550  NA 138 137 139  K 3.9 4.0 3.1*  CL 96* 94* 97*  CO2 31  --  29  GLUCOSE 141* 142* 120*  BUN 16 21* 13  CREATININE 1.70* 1.60* 1.65*  CALCIUM 9.6  --  9.2    Liver Function Tests:  Recent Labs Lab  02/28/16 1015  AST 19  ALT 18  ALKPHOS 83  BILITOT 0.6  PROT 6.2*  ALBUMIN 3.8   No results for input(s): LIPASE, AMYLASE in the last 168 hours. No results for input(s): AMMONIA in the last 168 hours.  CBC:  Recent Labs Lab 02/28/16 1015 02/28/16 1028  WBC 8.3  --   NEUTROABS 6.3  --   HGB 15.4 17.3*  HCT 46.5 51.0  MCV 86.4  --   PLT 190  --     Cardiac Enzymes: No results for input(s): CKTOTAL, CKMB, CKMBINDEX, TROPONINI in the last 168 hours.  Lipid Panel: No results for input(s): CHOL, TRIG, HDL, CHOLHDL, VLDL, LDLCALC in the last 168 hours.  CBG:  Recent Labs Lab 02/28/16 2154 02/29/16 0730  GLUCAP 107* 107*    Microbiology: Results for orders placed or performed during the hospital encounter of 12/08/11  Surgical pcr screen     Status: None   Collection Time: 12/08/11  3:50 PM  Result Value Ref Range Status   MRSA, PCR NEGATIVE NEGATIVE Final   Staphylococcus aureus NEGATIVE NEGATIVE Final    Comment:        The Xpert SA Assay (FDA approved for NASAL specimens only), is one component of a comprehensive surveillance program.  It is not intended to diagnose infection nor to guide or monitor treatment.    Coagulation Studies:  Recent Labs  02/28/16 1015  LABPROT 13.7  INR 1.03    Imaging: Ct Head Wo Contrast  02/28/2016  CLINICAL DATA:  Few episodes of whole-body tingling since yesterday,  funny feeling, dizziness, unsteady gait, history hypercholesterolemia, diabetes mellitus, hypertension, COPD, melanoma, former smoker EXAM: CT HEAD WITHOUT CONTRAST TECHNIQUE: Contiguous axial images were obtained from the base of the skull through the vertex without intravenous contrast. COMPARISON:  07/27/2013 FINDINGS: Generalized atrophy. Normal ventricular morphology. No midline shift or mass effect. Normal appearance of brain parenchyma. No intracranial hemorrhage, mass lesion or evidence acute infarction. No extra-axial fluid collections. Atherosclerotic  calcifications at the carotid siphons. Bones and sinuses unremarkable. IMPRESSION: No acute intracranial abnormalities. Electronically Signed   By: Lavonia Dana M.D.   On: 02/28/2016 11:35   Mr Jodene Nam Head Wo Contrast  02/28/2016  CLINICAL DATA:  Left-sided weakness, headache, dizziness, diffuse numbness, and blurred vision. EXAM: MRI HEAD WITHOUT CONTRAST MRA HEAD WITHOUT CONTRAST TECHNIQUE: Multiplanar, multiecho pulse sequences of the brain and surrounding structures were obtained without intravenous contrast. Angiographic images of the head were obtained using MRA technique without contrast. COMPARISON:  Head CT 02/28/2016 and MRI 03/06/2015 FINDINGS: MRI HEAD FINDINGS There is no evidence of acute infarct, intracranial hemorrhage, mass, midline shift, or extra-axial fluid collection. Moderate cerebral atrophy is unchanged. Small foci of T2 hyperintensity throughout the subcortical and deep cerebral white matter bilaterally are similar to the prior MRI and nonspecific but compatible with mild chronic small vessel ischemic disease. Orbits are unremarkable. Paranasal sinuses and mastoid air cells are clear. The left vertebral artery appears dominant with a normal distal right vertebral artery flow void not clearly identified, see below. MRA HEAD FINDINGS The imaged intracranial portion of the left vertebral artery appears patent and supplies the basilar artery. The distal right vertebral artery is not well seen,, without significant flow related enhancement is identified. PICA origins were not imaged. SCA origins are patent. Basilar artery is patent with a severe stenosis in its midportion. There are patent posterior communicating arteries bilaterally. The PCAs are mildly irregular are diffusely without significant proximal stenosis. The internal carotid arteries are patent from skullbase to carotid termini without stenosis. MCAs are patent without evidence of major branch occlusion or significant proximal stenosis.  Mild left MCA branch vessel irregularity is noted. ACAs are patent without evidence of significant proximal stenosis. There is asymmetric attenuation of the left ACA from the mid A2 level distally. No intracranial aneurysm is identified. IMPRESSION: 1. No acute intracranial abnormality. 2. Mild chronic small vessel ischemic disease and moderate cerebral atrophy. 3. Suspected occlusion (or severely diminished flow) of the distal right vertebral artery. 4. Severe mid basilar artery stenosis. 5. Anterior circulation branch vessel atherosclerosis without evidence of significant proximal stenosis. Electronically Signed   By: Logan Bores M.D.   On: 02/28/2016 15:19   Mr Brain Wo Contrast  02/28/2016  CLINICAL DATA:  Left-sided weakness, headache, dizziness, diffuse numbness, and blurred vision. EXAM: MRI HEAD WITHOUT CONTRAST MRA HEAD WITHOUT CONTRAST TECHNIQUE: Multiplanar, multiecho pulse sequences of the brain and surrounding structures were obtained without intravenous contrast. Angiographic images of the head were obtained using MRA technique without contrast. COMPARISON:  Head CT 02/28/2016 and MRI 03/06/2015 FINDINGS: MRI HEAD FINDINGS There is no evidence of acute infarct, intracranial hemorrhage, mass, midline shift, or extra-axial fluid collection. Moderate cerebral atrophy is unchanged. Small foci of T2 hyperintensity throughout the subcortical and deep cerebral white matter bilaterally are similar to the prior MRI and nonspecific but compatible with mild chronic small vessel ischemic disease. Orbits are unremarkable. Paranasal sinuses and mastoid air cells are clear. The left vertebral artery appears dominant with a normal distal right vertebral artery flow void  not clearly identified, see below. MRA HEAD FINDINGS The imaged intracranial portion of the left vertebral artery appears patent and supplies the basilar artery. The distal right vertebral artery is not well seen,, without significant flow related  enhancement is identified. PICA origins were not imaged. SCA origins are patent. Basilar artery is patent with a severe stenosis in its midportion. There are patent posterior communicating arteries bilaterally. The PCAs are mildly irregular are diffusely without significant proximal stenosis. The internal carotid arteries are patent from skullbase to carotid termini without stenosis. MCAs are patent without evidence of major branch occlusion or significant proximal stenosis. Mild left MCA branch vessel irregularity is noted. ACAs are patent without evidence of significant proximal stenosis. There is asymmetric attenuation of the left ACA from the mid A2 level distally. No intracranial aneurysm is identified. IMPRESSION: 1. No acute intracranial abnormality. 2. Mild chronic small vessel ischemic disease and moderate cerebral atrophy. 3. Suspected occlusion (or severely diminished flow) of the distal right vertebral artery. 4. Severe mid basilar artery stenosis. 5. Anterior circulation branch vessel atherosclerosis without evidence of significant proximal stenosis. Electronically Signed   By: Logan Bores M.D.   On: 02/28/2016 15:19       Assessment and plan per attending neurologist  Etta Quill PA-C [facility] [phone]  02/29/2016, 8:56 AM   Assessment/Plan: 82 YO male with chronic decreased vision, progressive gait difficulty, and decreased hearing. Presented to ED due to episodic whole body tingling which has resolved. MRA findings show possible occlusion (or severely diminished flow) of the distal right vertebral artery and severe mid-basilar artery stenosis of unknown age. Patient admitted to Hospital for further evaluation with cerebral angiogram.   Recommend 1) MRI cervical spine due to known cervical pathology  2) B12, TSH 3) Cerebral angiogram per IR team.   I personally participated in this patient's evaluation and management, including examination, as well as  formulating the above clinical assessment and management recommendations.  Rush Farmer M.D. [facility] [phone]

## 2016-02-29 NOTE — Progress Notes (Signed)
   Subjective: Gary Chang had no acute events overnight. This morning, he is lying comfortably in bed without any complaints. He is asymptomatic and has had no recurrence of the numbness, weakness, or headaches.  Objective: Vital signs in last 24 hours: Filed Vitals:   02/28/16 2155 02/29/16 0030 02/29/16 0942 02/29/16 1147  BP: 147/74 127/62 149/66 134/65  Pulse: 81 86 72 63  Temp: 98.4 F (36.9 C) 98.2 F (36.8 C) 97.3 F (36.3 C)   TempSrc: Oral Oral Oral   Resp: 16 16 18 15   Height:      Weight:      SpO2: 95% 94% 98% 95%    Gen: Well-appearing, alert and oriented to person, place, and time CV: Normal rate, regular rhythm, no murmurs, rubs, or gallops Pulmonary: Normal effort, CTA bilaterally, no crackles or wheezes Abdominal: Soft, non-tender, non-distended, without rebound, guarding, or masses Extremities: Distal pulses 2+ in upper and lower extremities bilaterally, no tenderness, erythema or edema Neuro: CN II-XII grossly intact, no focal weakness or sensory deficits noted Skin: No atypical appearing moles. No rashes  Lab Results: Basic Metabolic Panel:  Recent Labs Lab 02/28/16 1015 02/28/16 1028 02/29/16 0550 02/29/16 0914  NA 138 137 139  --   K 3.9 4.0 3.1*  --   CL 96* 94* 97*  --   CO2 31  --  29  --   GLUCOSE 141* 142* 120*  --   BUN 16 21* 13  --   CREATININE 1.70* 1.60* 1.65*  --   CALCIUM 9.6  --  9.2  --   MG  --   --   --  2.0   Assessment/Plan: 1. 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 Patient is without confusion, fevers, so infectious processes are less likely. Chronic, episodic symptoms are less suggestive of a CVA. EKG and troponins negative. CT head negative. 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 -Neurology on-board; appreciate the thoughtful care of the mutual patient -ASA, clopidogrel -F/u B12, TSH -Cerebral angiogram this morning -Continue home topiramate, PRN norco  Dispo: Disposition is deferred at this time, awaiting improvement of current medical problems.  Anticipated discharge in approximately 1-2 day(s).   The patient does have a current PCP Gary Pink, MD) and does need an [facility] hospital follow-up appointment after discharge.  The patient does not know have transportation limitations that hinder transportation to clinic appointments.   LOS: 1 day   Gary Gable, MD 02/29/2016, 12:07 PM

## 2016-02-29 NOTE — Sedation Documentation (Signed)
Patient denies pain and is resting comfortably.  

## 2016-02-29 NOTE — Care Management Note (Signed)
Case Management Note  Patient Details  Name: Gary Chang MRN: [id number] Date of Birth: [birth year]  Subjective/Objective:                    Action/Plan: Patient presented with episodic numbness and weakness. Lives at home with spouse. Will follow for discharge needs pending PT/OT evals and physician orders.  Expected Discharge Date:                  Expected Discharge Plan:     In-House Referral:     Discharge planning Services     Post Acute Care Choice:    Choice offered to:     DME Arranged:    DME Agency:     HH Arranged:    HH Agency:     Status of Service:  In process, will continue to follow  Medicare Important Message Given:    Date Medicare IM Given:    Medicare IM give by:    Date Additional Medicare IM Given:    Additional Medicare Important Message give by:     If discussed at [place] of Stay Meetings, dates discussed:    Additional Comments:  Rolm Baptise, RN 02/29/2016, 10:33 AM [phone]

## 2016-02-29 NOTE — Procedures (Signed)
S/P bilateral common carotid arteriogram and bilateral subclavian arteriogram. Lt CFA approach. Findings. 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 2.Approx 60 to 70 % stenosis LV in mid cervical region

## 2016-02-29 NOTE — H&P (Signed)
Chief Complaint: Patient was seen in consultation today for  Chief Complaint  Patient presents with  . Numbness  . Weakness  . Dizziness    Referring Physician(s): VAN DAM, CORNELIUS N  Supervising Physician: Deveshwar  History of Present Illness: Gary Chang is a 82 y.o. male who was sent to ED after he was sent to ED after he mentioned his symptoms of sudden onset of whole body numbness and tingling two nights ago. He states this comes and goes.  He has chronic tremor, decreased vision secondary to bilateral cataracts which he is set up to have surgery for, decreased hearing, bilateral LE neuropathy, loss of cervical lordosis and history of cervical fusion.   He states his gait has been off for a year but feels this has worsened over the past week.  He states at times he has a "woozy feeling" only when he get up from lying or sitting position.   He denies falls or syncopal episodes.  He also reports an intermittent sharp, shooting headache that starts at the tips of his ears and moves to the top of his head that occur 1-2 times/day. There is also a residual dull pain at the top of his head that he feels is getting worse over the past week  He states he has arthritis in his knees which affects his gait and causes discomfort with ambulation.  Currently has no complaint.  In ED he had a MRA of head which showed a diminutive right vertebral artery and stenosis of his basilar.   We have been consulted to perform a cerebral angiogram.   Currently he has a Creatinine of 1.65.   He had a dose of Plavix yesterday at 4pm.  Past Medical History  Diagnosis Date  . High cholesterol   . Diabetes mellitus     tingling in feet  . Arthritis   . GERD (gastroesophageal reflux disease)   . H/O hiatal hernia   . Enlarged prostate     laser surgery  . Hypertension     pcp  dr Jeneen Rinks [place]  [place]  . Cancer ([place])     skin ca removed  . Anxiety   . Allergic state     . Abdominal aortic aneurysm (AAA) ([place])   . COPD (chronic obstructive pulmonary disease) ([place])   . Depression   . Kidney stones   . Melanoma ([place])   . Renal artery aneurysm [facility])     Past Surgical History  Procedure Laterality Date  . Hernia repair    . Kidney stone surgery    . Abdominal aortic aneurysm repair    . Back surgery      lumbar  ,cervical surgeries  . Lumbar laminectomy/decompression microdiscectomy  12/10/2011    Procedure: LUMBAR LAMINECTOMY/DECOMPRESSION MICRODISCECTOMY;  Surgeon: Ophelia Charter;  Location: [facility] NEURO ORS;  Service: Neurosurgery;;  Thoracic One-Two Laminectomy  . Esophagogastroduodenoscopy (egd) with propofol N/A 02/11/2016    Procedure: ESOPHAGOGASTRODUODENOSCOPY (EGD) WITH PROPOFOL;  Surgeon: Hulen Luster, MD;  Location: [facility] ENDOSCOPY;  Service: Gastroenterology;  Laterality: N/A;    Allergies: Statins  Medications: Prior to Admission medications   Medication Sig Start Date End Date Taking? Authorizing Provider  ALPRAZolam Duanne Moron) 0.25 MG tablet Take 0.25 tablets by mouth as needed. 01/02/14  Yes Historical Provider, MD  amLODipine (NORVASC) 10 MG tablet Take 10 mg by mouth every evening.     Yes Historical Provider, MD  aspirin EC 81 MG tablet Take 81 mg by  mouth at bedtime.     Yes Historical Provider, MD  b complex vitamins tablet Take 1 tablet by mouth daily.   Yes Historical Provider, MD  buPROPion (WELLBUTRIN XL) 300 MG 24 hr tablet Take 300 mg by mouth daily.   Yes Historical Provider, MD  Cholecalciferol (VITAMIN D3 SUPER STRENGTH) 2000 UNITS TABS Take 2,000 Units by mouth daily.     Yes Historical Provider, MD  citalopram (CELEXA) 20 MG tablet Take 20 mg by mouth at bedtime.    Yes Historical Provider, MD  gabapentin (NEURONTIN) 600 MG tablet Take 1 tablet by mouth daily. 09/26/13  Yes Historical Provider, MD  glipiZIDE (GLUCOTROL XL) 2.5 MG 24 hr tablet Take 2.5 mg by mouth at bedtime.     Yes Historical Provider, MD  hydrALAZINE  (APRESOLINE) 100 MG tablet Take 1 tablet by mouth 2 (two) times daily. 11/02/13  Yes Historical Provider, MD  hydrochlorothiazide (HYDRODIURIL) 25 MG tablet Take 25 mg by mouth daily.   Yes Historical Provider, MD  HYDROcodone-acetaminophen (NORCO/VICODIN) 5-325 MG per tablet Take 1 tablet by mouth as needed. 10/25/13  Yes Historical Provider, MD  losartan (COZAAR) 100 MG tablet Take 100 mg by mouth every morning.     Yes Historical Provider, MD  magnesium oxide (MAG-OX) 400 MG tablet Take 400 mg by mouth daily.   Yes Historical Provider, MD  metoprolol succinate (TOPROL-XL) 25 MG 24 hr tablet Take 25 mg by mouth at bedtime.     Yes Historical Provider, MD  potassium chloride SA (K-DUR,KLOR-CON) 20 MEQ tablet Take 20 mEq by mouth every morning.     Yes Historical Provider, MD  predniSONE (DELTASONE) 10 MG tablet Take 10 tablets by mouth daily. 01/23/14  Yes Historical Provider, MD  tamsulosin (FLOMAX) 0.4 MG CAPS capsule Take 0.4 mg by mouth daily.    Yes Historical Provider, MD  topiramate (TOPAMAX) 25 MG tablet Take 1 tablet (25 mg total) by mouth 2 (two) times daily. 01/31/14  Yes Garvin Fila, MD  hydrALAZINE (APRESOLINE) 100 MG tablet Take 1 tablet (100 mg total) by mouth 3 (three) times daily. 11/20/11 01/31/14  Crecencio Mc, MD     Family History  Problem Relation Age of Onset  . Anesthesia problems Neg Hx   . Hypotension Neg Hx   . Malignant hyperthermia Neg Hx   . Pseudochol deficiency Neg Hx   . Heart Problems Mother   . Tuberculosis Father     Social History   Social History  . Marital Status: Married    Spouse Name: [name]  . Number of Children: 2  . Years of Education: HS   Occupational History  . Retired    Social History Main Topics  . Smoking status: Former Smoker    Types: Pipe  . Smokeless tobacco: Never Used  . Alcohol Use: Yes     Comment: occ 1-2 beers a month  . Drug Use: No  . Sexual Activity: Not Asked   Other Topics Concern  . None   Social History  Narrative   Patient lives at home with his spouse.   Caffeine Use: 1-2 cups daily    Review of Systems: A 12 point ROS discussed  Review of Systems  Constitutional: Positive for activity change. Negative for fever, chills, appetite change and fatigue.  HENT: Negative.   Respiratory: Negative for cough and shortness of breath.   Cardiovascular: Negative for chest pain.  Gastrointestinal: Negative for nausea, vomiting and abdominal pain.  Genitourinary: Negative.  Musculoskeletal: Positive for arthralgias and gait problem.  Skin: Negative.   Neurological: Positive for tremors, weakness and numbness.  Hematological: Negative.   Psychiatric/Behavioral: Negative.     Vital Signs: BP 149/66 mmHg  Pulse 72  Temp(Src) 97.3 F (36.3 C) (Oral)  Resp 18  Ht 5\' 8"  (1.727 m)  Wt 210 lb 3.2 oz (95.346 kg)  BMI 31.97 kg/m2  SpO2 98%  Physical Exam  Constitutional: He is oriented to person, place, and time. He appears well-developed and well-nourished.  HENT:  Head: Normocephalic and atraumatic.  Eyes: EOM are normal.  Neck: Normal range of motion. Neck supple.  Cardiovascular: Normal rate, regular rhythm and normal heart sounds.   Diminished femoral and anterior tibial pulses bilaterally.  Pulmonary/Chest: Effort normal and breath sounds normal.  Abdominal: Soft. Bowel sounds are normal. He exhibits no distension. There is no tenderness.  Musculoskeletal: Normal range of motion. He exhibits no tenderness.  Neurological: He is alert and oriented to person, place, and time. No cranial nerve deficit.  Skin: Skin is warm and dry.  Psychiatric: His behavior is normal. Judgment and thought content normal.  Vitals reviewed.   Mallampati Score:  MD Evaluation Airway: WNL Heart: WNL Abdomen: WNL Chest/ Lungs: WNL ASA  Classification: 2 Mallampati/Airway Score: One  Imaging: Ct Head Wo Contrast  02/28/2016  CLINICAL DATA:  Few episodes of whole-body tingling since yesterday, funny  feeling, dizziness, unsteady gait, history hypercholesterolemia, diabetes mellitus, hypertension, COPD, melanoma, former smoker EXAM: CT HEAD WITHOUT CONTRAST TECHNIQUE: Contiguous axial images were obtained from the base of the skull through the vertex without intravenous contrast. COMPARISON:  07/27/2013 FINDINGS: Generalized atrophy. Normal ventricular morphology. No midline shift or mass effect. Normal appearance of brain parenchyma. No intracranial hemorrhage, mass lesion or evidence acute infarction. No extra-axial fluid collections. Atherosclerotic calcifications at the carotid siphons. Bones and sinuses unremarkable. IMPRESSION: No acute intracranial abnormalities. Electronically Signed   By: Lavonia Dana M.D.   On: 02/28/2016 11:35   Mr Jodene Nam Head Wo Contrast  02/28/2016  CLINICAL DATA:  Left-sided weakness, headache, dizziness, diffuse numbness, and blurred vision. EXAM: MRI HEAD WITHOUT CONTRAST MRA HEAD WITHOUT CONTRAST TECHNIQUE: Multiplanar, multiecho pulse sequences of the brain and surrounding structures were obtained without intravenous contrast. Angiographic images of the head were obtained using MRA technique without contrast. COMPARISON:  Head CT 02/28/2016 and MRI 03/06/2015 FINDINGS: MRI HEAD FINDINGS There is no evidence of acute infarct, intracranial hemorrhage, mass, midline shift, or extra-axial fluid collection. Moderate cerebral atrophy is unchanged. Small foci of T2 hyperintensity throughout the subcortical and deep cerebral white matter bilaterally are similar to the prior MRI and nonspecific but compatible with mild chronic small vessel ischemic disease. Orbits are unremarkable. Paranasal sinuses and mastoid air cells are clear. The left vertebral artery appears dominant with a normal distal right vertebral artery flow void not clearly identified, see below. MRA HEAD FINDINGS The imaged intracranial portion of the left vertebral artery appears patent and supplies the basilar artery. The  distal right vertebral artery is not well seen,, without significant flow related enhancement is identified. PICA origins were not imaged. SCA origins are patent. Basilar artery is patent with a severe stenosis in its midportion. There are patent posterior communicating arteries bilaterally. The PCAs are mildly irregular are diffusely without significant proximal stenosis. The internal carotid arteries are patent from skullbase to carotid termini without stenosis. MCAs are patent without evidence of major branch occlusion or significant proximal stenosis. Mild left MCA branch vessel irregularity is noted.  ACAs are patent without evidence of significant proximal stenosis. There is asymmetric attenuation of the left ACA from the mid A2 level distally. No intracranial aneurysm is identified. IMPRESSION: 1. No acute intracranial abnormality. 2. Mild chronic small vessel ischemic disease and moderate cerebral atrophy. 3. Suspected occlusion (or severely diminished flow) of the distal right vertebral artery. 4. Severe mid basilar artery stenosis. 5. Anterior circulation branch vessel atherosclerosis without evidence of significant proximal stenosis. Electronically Signed   By: Logan Bores M.D.   On: 02/28/2016 15:19   Mr Brain Wo Contrast  02/28/2016  CLINICAL DATA:  Left-sided weakness, headache, dizziness, diffuse numbness, and blurred vision. EXAM: MRI HEAD WITHOUT CONTRAST MRA HEAD WITHOUT CONTRAST TECHNIQUE: Multiplanar, multiecho pulse sequences of the brain and surrounding structures were obtained without intravenous contrast. Angiographic images of the head were obtained using MRA technique without contrast. COMPARISON:  Head CT 02/28/2016 and MRI 03/06/2015 FINDINGS: MRI HEAD FINDINGS There is no evidence of acute infarct, intracranial hemorrhage, mass, midline shift, or extra-axial fluid collection. Moderate cerebral atrophy is unchanged. Small foci of T2 hyperintensity throughout the subcortical and deep  cerebral white matter bilaterally are similar to the prior MRI and nonspecific but compatible with mild chronic small vessel ischemic disease. Orbits are unremarkable. Paranasal sinuses and mastoid air cells are clear. The left vertebral artery appears dominant with a normal distal right vertebral artery flow void not clearly identified, see below. MRA HEAD FINDINGS The imaged intracranial portion of the left vertebral artery appears patent and supplies the basilar artery. The distal right vertebral artery is not well seen,, without significant flow related enhancement is identified. PICA origins were not imaged. SCA origins are patent. Basilar artery is patent with a severe stenosis in its midportion. There are patent posterior communicating arteries bilaterally. The PCAs are mildly irregular are diffusely without significant proximal stenosis. The internal carotid arteries are patent from skullbase to carotid termini without stenosis. MCAs are patent without evidence of major branch occlusion or significant proximal stenosis. Mild left MCA branch vessel irregularity is noted. ACAs are patent without evidence of significant proximal stenosis. There is asymmetric attenuation of the left ACA from the mid A2 level distally. No intracranial aneurysm is identified. IMPRESSION: 1. No acute intracranial abnormality. 2. Mild chronic small vessel ischemic disease and moderate cerebral atrophy. 3. Suspected occlusion (or severely diminished flow) of the distal right vertebral artery. 4. Severe mid basilar artery stenosis. 5. Anterior circulation branch vessel atherosclerosis without evidence of significant proximal stenosis. Electronically Signed   By: Logan Bores M.D.   On: 02/28/2016 15:19    Labs:  CBC:  Recent Labs  11/26/15 1146 02/28/16 1015 02/28/16 1028  WBC 9.3 8.3  --   HGB 15.3 15.4 17.3*  HCT 46.6 46.5 51.0  PLT 190 190  --     COAGS:  Recent Labs  02/28/16 1015  INR 1.03  APTT 29     BMP:  Recent Labs  11/26/15 1146 02/28/16 1015 02/28/16 1028 02/29/16 0550  NA 134* 138 137 139  K 3.5 3.9 4.0 3.1*  CL 97* 96* 94* 97*  CO2 30 31  --  29  GLUCOSE 91 141* 142* 120*  BUN 22* 16 21* 13  CALCIUM 9.4 9.6  --  9.2  CREATININE 1.79* 1.70* 1.60* 1.65*  GFRNONAA 33* 35*  --  36*  GFRAA 38* 41*  --  42*    LIVER FUNCTION TESTS:  Recent Labs  11/26/15 1146 02/28/16 1015  BILITOT 0.7 0.6  AST 20 19  ALT 17 18  ALKPHOS 87 83  PROT 6.9 6.2*  ALBUMIN 4.0 3.8    TUMOR MARKERS: No results for input(s): AFPTM, CEA, CA199, CHROMGRNA in the last 8760 hours.  Assessment and Plan:  Episodic numbness and weakness  Headaches   MRI head revealed severe basilar artery stenosis and right vertebral artery stenosis  Will proceed with cerebral angiography today by Dr. Estanislado Pandy.  Risks and Benefits discussed with the patient including, but not limited to bleeding, infection, vascular injury, contrast induced renal failure, stroke or even death.  All of the patient's questions were answered, patient is agreeable to proceed. Consent signed and in chart.  Thank you for this interesting consult.  I greatly enjoyed meeting JAEGAR SAMMARCO and look forward to participating in their care.  A copy of this report was sent to the requesting provider on this date.  Electronically Signed: Murrell Redden PA-C 02/29/2016, 10:31 AM   I spent a total of 40 Minutes in face to face in clinical consultation, greater than 50% of which was counseling/coordinating care for cerebral angiography.

## 2016-02-29 NOTE — Progress Notes (Signed)
Received pt from ir in n oacute distress. Flat in bed, legs straight. Bandages to r & l groin, no s/o hemorrhages, distal pulses present and equal. Advised pt to remain flat until instructed otherwise.

## 2016-03-01 LAB — HIV ANTIBODY (ROUTINE TESTING W REFLEX): HIV Screen 4th Generation wRfx: NONREACTIVE

## 2016-03-01 LAB — BASIC METABOLIC PANEL
Anion gap: 9 (ref 5–15)
BUN: 16 mg/dL (ref 6–20)
CHLORIDE: 100 mmol/L — AB (ref 101–111)
CO2: 27 mmol/L (ref 22–32)
CREATININE: 1.72 mg/dL — AB (ref 0.61–1.24)
Calcium: 8.7 mg/dL — ABNORMAL LOW (ref 8.9–10.3)
GFR calc Af Amer: 40 mL/min — ABNORMAL LOW (ref >60)
GFR calc non Af Amer: 34 mL/min — ABNORMAL LOW (ref >60)
Glucose, Bld: 93 mg/dL (ref 65–99)
POTASSIUM: 3.7 mmol/L (ref 3.5–5.1)
SODIUM: 136 mmol/L (ref 135–145)

## 2016-03-01 LAB — GLUCOSE, CAPILLARY
GLUCOSE-CAPILLARY: 99 mg/dL (ref 65–99)
GLUCOSE-CAPILLARY: 106 mg/dL — AB (ref 65–99)
Glucose-Capillary: 91 mg/dL (ref 65–99)

## 2016-03-01 MED ORDER — CLOPIDOGREL BISULFATE 75 MG PO TABS: 75.0000 mg | ORAL_TABLET | Freq: Every day | ORAL | Status: DC

## 2016-03-01 NOTE — Discharge Summary (Signed)
Name: Gary Chang MRN: [id number] DOB: [birth year] 82 y.o. PCP: Gary Pink, MD  Date of Admission: 02/28/2016 11:46 AM Date of Discharge: 03/01/2016 Attending Physician: Truman Hayward, MD  Discharge Diagnosis: Active Problems:   Basilar artery stenosis   Left-sided weakness   Occlusion and stenosis of basilar artery   Vertebral artery stenosis  Discharge Medications:   Medication List    STOP taking these medications        predniSONE 10 MG tablet  Commonly known as:  DELTASONE      TAKE these medications        ALPRAZolam 0.25 MG tablet  Commonly known as:  XANAX  Take 0.25 tablets by mouth as needed.     amLODipine 10 MG tablet  Commonly known as:  NORVASC  Take 10 mg by mouth every evening.     aspirin EC 81 MG tablet  Take 81 mg by mouth at bedtime.     b complex vitamins tablet  Take 1 tablet by mouth daily.     buPROPion 300 MG 24 hr tablet  Commonly known as:  WELLBUTRIN XL  Take 300 mg by mouth daily.     citalopram 20 MG tablet  Commonly known as:  CELEXA  Take 20 mg by mouth at bedtime.     clopidogrel 75 MG tablet  Commonly known as:  PLAVIX  Take 1 tablet (75 mg total) by mouth daily.     gabapentin 600 MG tablet  Commonly known as:  NEURONTIN  Take 1 tablet by mouth daily.     glipiZIDE 2.5 MG 24 hr tablet  Commonly known as:  GLUCOTROL XL  Take 2.5 mg by mouth at bedtime.     hydrALAZINE 100 MG tablet  Commonly known as:  APRESOLINE  Take 1 tablet by mouth 2 (two) times daily.     hydrochlorothiazide 25 MG tablet  Commonly known as:  HYDRODIURIL  Take 25 mg by mouth daily.     HYDROcodone-acetaminophen 5-325 MG tablet  Commonly known as:  NORCO/VICODIN  Take 1 tablet by mouth as needed.     losartan 100 MG tablet  Commonly known as:  COZAAR  Take 100 mg by mouth every morning.     magnesium oxide 400 MG tablet  Commonly known as:  MAG-OX  Take 400 mg by mouth daily.     metoprolol succinate 25 MG 24 hr  tablet  Commonly known as:  TOPROL-XL  Take 25 mg by mouth at bedtime.     potassium chloride SA 20 MEQ tablet  Commonly known as:  K-DUR,KLOR-CON  Take 20 mEq by mouth every morning.     tamsulosin 0.4 MG Caps capsule  Commonly known as:  FLOMAX  Take 0.4 mg by mouth daily.     topiramate 25 MG tablet  Commonly known as:  TOPAMAX  Take 1 tablet (25 mg total) by mouth 2 (two) times daily.     VITAMIN D3 SUPER STRENGTH 2000 units Tabs  Generic drug:  Cholecalciferol  Take 2,000 Units by mouth daily.        Disposition and follow-up:   GarySayvon R Chang was discharged from [facility] in Good condition.  At the hospital follow up visit please address:  1.  Please ensure patient is taking plavix and aspirin qd.   2.  Labs / imaging needed at time of follow-up: none  3.  Pending labs/ test needing follow-up: none  Follow-up Appointments:  Follow-up Information    Follow up with Gary Pink, MD.   Specialty:  Family Medicine   Contact information:   [address] [facility] [place] [address] [phone]       Follow up with Rob Hickman, MD.   Specialty:  Interventional Radiology   Why:  Call [phone] to make an appointment in 1-2 weeks to see Dr. Tamala Bari information:   [address] [place] [address] [phone]       Discharge Instructions: Discharge Instructions    Increase activity slowly    Complete by:  As directed             Procedures Performed:  Ct Head Wo Contrast  02/28/2016  CLINICAL DATA:  Few episodes of whole-body tingling since yesterday, funny feeling, dizziness, unsteady gait, history hypercholesterolemia, diabetes mellitus, hypertension, COPD, melanoma, former smoker EXAM: CT HEAD WITHOUT CONTRAST TECHNIQUE: Contiguous axial images were obtained from the base of the skull through the vertex without intravenous contrast. COMPARISON:  07/27/2013 FINDINGS:  Generalized atrophy. Normal ventricular morphology. No midline shift or mass effect. Normal appearance of brain parenchyma. No intracranial hemorrhage, mass lesion or evidence acute infarction. No extra-axial fluid collections. Atherosclerotic calcifications at the carotid siphons. Bones and sinuses unremarkable. IMPRESSION: No acute intracranial abnormalities. Electronically Signed   By: Lavonia Dana M.D.   On: 02/28/2016 11:35   Mr Gary Chang Head Wo Contrast  02/28/2016  CLINICAL DATA:  Left-sided weakness, headache, dizziness, diffuse numbness, and blurred vision. EXAM: MRI HEAD WITHOUT CONTRAST MRA HEAD WITHOUT CONTRAST TECHNIQUE: Multiplanar, multiecho pulse sequences of the brain and surrounding structures were obtained without intravenous contrast. Angiographic images of the head were obtained using MRA technique without contrast. COMPARISON:  Head CT 02/28/2016 and MRI 03/06/2015 FINDINGS: MRI HEAD FINDINGS There is no evidence of acute infarct, intracranial hemorrhage, mass, midline shift, or extra-axial fluid collection. Moderate cerebral atrophy is unchanged. Small foci of T2 hyperintensity throughout the subcortical and deep cerebral white matter bilaterally are similar to the prior MRI and nonspecific but compatible with mild chronic small vessel ischemic disease. Orbits are unremarkable. Paranasal sinuses and mastoid air cells are clear. The left vertebral artery appears dominant with a normal distal right vertebral artery flow void not clearly identified, see below. MRA HEAD FINDINGS The imaged intracranial portion of the left vertebral artery appears patent and supplies the basilar artery. The distal right vertebral artery is not well seen,, without significant flow related enhancement is identified. PICA origins were not imaged. SCA origins are patent. Basilar artery is patent with a severe stenosis in its midportion. There are patent posterior communicating arteries bilaterally. The PCAs are mildly  irregular are diffusely without significant proximal stenosis. The internal carotid arteries are patent from skullbase to carotid termini without stenosis. MCAs are patent without evidence of major branch occlusion or significant proximal stenosis. Mild left MCA branch vessel irregularity is noted. ACAs are patent without evidence of significant proximal stenosis. There is asymmetric attenuation of the left ACA from the mid A2 level distally. No intracranial aneurysm is identified. IMPRESSION: 1. No acute intracranial abnormality. 2. Mild chronic small vessel ischemic disease and moderate cerebral atrophy. 3. Suspected occlusion (or severely diminished flow) of the distal right vertebral artery. 4. Severe mid basilar artery stenosis. 5. Anterior circulation branch vessel atherosclerosis without evidence of significant proximal stenosis. Electronically Signed   By: Logan Bores M.D.   On: 02/28/2016 15:19   Mr Brain Wo Contrast  02/28/2016  CLINICAL DATA:  Left-sided weakness, headache, dizziness, diffuse numbness, and blurred vision. EXAM: MRI HEAD WITHOUT CONTRAST MRA HEAD WITHOUT CONTRAST TECHNIQUE: Multiplanar, multiecho pulse sequences of the brain and surrounding structures were obtained without intravenous contrast. Angiographic images of the head were obtained using MRA technique without contrast. COMPARISON:  Head CT 02/28/2016 and MRI 03/06/2015 FINDINGS: MRI HEAD FINDINGS There is no evidence of acute infarct, intracranial hemorrhage, mass, midline shift, or extra-axial fluid collection. Moderate cerebral atrophy is unchanged. Small foci of T2 hyperintensity throughout the subcortical and deep cerebral white matter bilaterally are similar to the prior MRI and nonspecific but compatible with mild chronic small vessel ischemic disease. Orbits are unremarkable. Paranasal sinuses and mastoid air cells are clear. The left vertebral artery appears dominant with a normal distal right vertebral artery flow void  not clearly identified, see below. MRA HEAD FINDINGS The imaged intracranial portion of the left vertebral artery appears patent and supplies the basilar artery. The distal right vertebral artery is not well seen,, without significant flow related enhancement is identified. PICA origins were not imaged. SCA origins are patent. Basilar artery is patent with a severe stenosis in its midportion. There are patent posterior communicating arteries bilaterally. The PCAs are mildly irregular are diffusely without significant proximal stenosis. The internal carotid arteries are patent from skullbase to carotid termini without stenosis. MCAs are patent without evidence of major branch occlusion or significant proximal stenosis. Mild left MCA branch vessel irregularity is noted. ACAs are patent without evidence of significant proximal stenosis. There is asymmetric attenuation of the left ACA from the mid A2 level distally. No intracranial aneurysm is identified. IMPRESSION: 1. No acute intracranial abnormality. 2. Mild chronic small vessel ischemic disease and moderate cerebral atrophy. 3. Suspected occlusion (or severely diminished flow) of the distal right vertebral artery. 4. Severe mid basilar artery stenosis. 5. Anterior circulation branch vessel atherosclerosis without evidence of significant proximal stenosis. Electronically Signed   By: Logan Bores M.D.   On: 02/28/2016 15:19     Admission HPI: Mr. Lare is an 82yo with T2DM, HTN, AAA s/p repair now with incisional hernia, and BPH who presents with diffuse numbness and tingling as well as weakness throughout his body that started last night and "comes and goes". He says he has also had generalized malaise since last night. In addition, he does have chronic dizziness and gait unsteadiness that has worsened acutely over the past 1 week. However, he denies any falls, syncope, vertigo, or vision changes. He continues to have intermittent sharp, shooting headaches  that starts at the tips of his ears and moves to the top of his head that occur 1-2 times/day. There is also a residual dull pain at the top of his head that he feels is getting worse over the past week. He denies any fevers, vision changes, lightheadedness, syncope, falls, slurred speech, chest pain, shortness of breath, abdominal pain, nausea, vomiting, urinary incontinence, polyuria, dysuria, diarrhea, rashes, sick contacts, or other symptoms at this time.   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 Patient is without confusion, fevers, so infectious processes are less likely. Chronic, episodic symptoms are less suggestive of a CVA. EKG and troponins negative. CT head negative. 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 Pt had a cerebral arteriogram by Dr. Estanislado Pandy that revealed an occluded right VA at origin and 60-70% stenosis of LV in the mid cervical region. Dr. Estanislado Pandy was contacted the day of discharge and he had no further recommendations or planned intervention of patient during this admission. Patient was instructed to continue aspirin and plavix daily. Physical therapy evaluated patient and had no further recommendations. Pt states his presenting weakness had resolved. He was given the contact number for Dr. Arlean Hopping office and instructed to make a hospital follow up appointment in 1-2 weeks.    Discharge Vitals:   BP 127/57 mmHg  Pulse 65  Temp(Src) 97.5 F (36.4 C) (Oral)  Resp 165  Ht 5\' 8"  (1.727 m)  Wt 210 lb 3.2 oz (95.346 kg)  BMI 31.97 kg/m2  SpO2 97%  Discharge  Labs:  Results for orders placed or performed during the hospital encounter of 02/28/16 (from the past 24 hour(s))  Glucose, capillary     Status: Abnormal   Collection Time: 02/29/16  9:26 PM  Result Value Ref Range   Glucose-Capillary 114 (H) 65 - 99 mg/dL   Comment 1 Notify RN    Comment 2 Document in Chart   HIV antibody     Status: None   Collection Time: 03/01/16  3:52 AM  Result Value Ref Range   HIV Screen 4th Generation wRfx Non Reactive Non Reactive  Basic metabolic panel     Status: Abnormal   Collection Time: 03/01/16  3:52 AM  Result Value Ref Range   Sodium 136 135 - 145 mmol/L   Potassium 3.7 3.5 - 5.1 mmol/L   Chloride 100 (L) 101 - 111 mmol/L   CO2 27 22 - 32 mmol/L   Glucose, Bld 93 65 - 99 mg/dL   BUN 16 6 - 20 mg/dL   Creatinine, Ser 1.72 (H) 0.61 - 1.24 mg/dL   Calcium 8.7 (L) 8.9 - 10.3 mg/dL   GFR calc non Af Amer 34 (L) >60 mL/min   GFR calc Af Amer 40 (L) >60 mL/min   Anion gap 9 5 - 15  Glucose, capillary     Status: None   Collection Time: 03/01/16  5:57 AM  Result Value Ref Range   Glucose-Capillary 99 65 - 99 mg/dL   Comment 1 Notify RN    Comment 2 Document in Chart   Glucose, capillary     Status: Abnormal   Collection Time: 03/01/16 11:34 AM  Result Value Ref Range   Glucose-Capillary 106 (H) 65 - 99 mg/dL   Comment 1 Notify RN    Comment 2 Document in Chart     Signed: Norman Herrlich, MD 03/01/2016, 6:23 PM    Services Ordered on Discharge: none Equipment Ordered on Discharge: none

## 2016-03-01 NOTE — Evaluation (Signed)
Physical Therapy Evaluation & Discharge  Patient Details Name: Gary Chang MRN: [id number] DOB: 11/08/1928 Today's Date: 03/01/2016   History of Present Illness  Mr. Medendorp is an 82yo with T2DM, HTN, AAA s/p repair now with incisional hernia, and BPH who presents with diffuse numbness and tingling as well as weakness throughout his body that started last night and "comes and goes". 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  Clinical Impression  Patient presents without current needs for follow up PT or DME.  Feel he can safely d/c home with family and current DME for mobility .  Did review fall prevention information and option of outpatient PT for balance and strengthening if he wishes to seek referral from his PCP.  Will sign off as planned d/c home today.    Follow Up Recommendations No PT follow up    Equipment Recommendations  None recommended by PT    Recommendations for Other Services       Precautions / Restrictions Precautions Precautions: Fall      Mobility  Bed Mobility               General bed mobility comments: NT up in chair  Transfers Overall transfer level: Modified independent Equipment used: None                Ambulation/Gait Ambulation/Gait assistance: Supervision;Min guard Ambulation Distance (Feet): 250 Feet Assistive device: None Gait Pattern/deviations: Step-through pattern;Decreased stride length;Staggering right;Staggering left     General Gait Details: LOB when looking to R min A to recover, uses walker at home, but wanted to try without device; encouraged to continue walker use at home  Stairs Stairs: Yes Stairs assistance: Modified independent (Device/Increase time) Stair Management: Alternating pattern;Forwards;Two rails Number of Stairs: 2    Wheelchair Mobility    Modified Rankin (Stroke Patients Only)       Balance Overall balance assessment: Needs assistance           Standing balance-Leahy  Scale: Good Standing balance comment: imbalance with fatigue wtih walking without device                             Pertinent Vitals/Pain Pain Assessment: No/denies pain    Home Living Family/patient expects to be discharged to:: Private residence Living Arrangements: Spouse/significant other Available Help at Discharge: Family Type of Home: House Home Access: Stairs to enter Entrance Stairs-Rails: Psychiatric nurse of Steps: 2 Home Layout: One level Home Equipment: Grab bars - tub/shower;Grab bars - toilet;Walker - 4 wheels;Walker - 2 wheels;Shower seat      Prior Function Level of Independence: Independent with assistive device(s)         Comments: uses rollator due to bad knees; assists his wife who had a stroke with transfers     Hand Dominance        Extremity/Trunk Assessment               Lower Extremity Assessment: Overall WFL for tasks assessed         Communication   Communication: No difficulties  Cognition Arousal/Alertness: Awake/alert Behavior During Therapy: WFL for tasks assessed/performed Overall Cognitive Status: Within Functional Limits for tasks assessed                      General Comments General comments (skin integrity, edema, etc.): Reviewed fall prevention information with patient and family; educated to seek advice from PCP for outpatient PT referral  for balance and strengthening    Exercises        Assessment/Plan    PT Assessment Patent does not need any further PT services  PT Diagnosis Abnormality of gait   PT Problem List    PT Treatment Interventions     PT Goals (Current goals can be found in the Care Plan section) Acute Rehab PT Goals PT Goal Formulation: All assessment and education complete, DC therapy    Frequency     Barriers to discharge        Co-evaluation               End of Session   Activity Tolerance: Patient tolerated treatment well Patient  left: in chair;with call bell/phone within reach;with family/visitor present Nurse Communication: Other (comment) (no follow up needs)    Functional Assessment Tool Used: Clinical Judgement Functional Limitation: Self care Self Care Current Status [id number]): At least 1 percent but less than 20 percent impaired, limited or restricted Self Care Goal Status [id number]): 0 percent impaired, limited or restricted Self Care Discharge Status [phone]): 0 percent impaired, limited or restricted    Time: 1705-1720 PT Time Calculation (min) (ACUTE ONLY): 15 min   Charges:   PT Evaluation $PT Eval Low Complexity: 1 Procedure     PT G Codes:   PT G-Codes **NOT FOR INPATIENT CLASS** Functional Assessment Tool Used: Clinical Judgement Functional Limitation: Self care Self Care Current Status [id number]): At least 1 percent but less than 20 percent impaired, limited or restricted Self Care Goal Status [id number]): 0 percent impaired, limited or restricted Self Care Discharge Status [phone]): 0 percent impaired, limited or restricted    Reginia Naas 03/01/2016, 5:49 PM  Magda Kiel, [place] 03/01/2016

## 2016-03-01 NOTE — Progress Notes (Signed)
   Subjective: Gary Chang had no acute events overnight. States he had a sharp shoot pain in his neck that traveled to his head. These headaches are chronic and he takes percocet for them. Denies any weakness.   Objective: Vital signs in last 24 hours: Filed Vitals:   02/29/16 2200 03/01/16 0201 03/01/16 0613 03/01/16 0824  BP: 159/73 114/58 118/51 118/52  Pulse: 72 72 81 74  Temp: 98.9 F (37.2 C) 98.9 F (37.2 C) 97.9 F (36.6 C) 97.7 F (36.5 C)  TempSrc: Oral Oral Oral Oral  Resp: 20 18 18 18   Height:      Weight:      SpO2: 97% 94% 98% 99%    Gen: Well-appearing, alert and oriented to person, place, and time CV: Normal rate, regular rhythm, no murmurs, rubs, or gallops Pulmonary: Normal effort, CTA bilaterally, no crackles or wheezes Abdominal: Soft, non-tender, non-distended, without rebound, guarding, or masses Extremities: Distal pulses 2+ in upper and lower extremities bilaterally, no tenderness, erythema or edema Neuro: CN II-XII grossly intact, no focal weakness or sensory deficits noted Skin: No atypical appearing moles. No rashes  Lab Results: Basic Metabolic Panel:  Recent Labs Lab 02/29/16 0550 02/29/16 0914 03/01/16 0352  NA 139  --  136  K 3.1*  --  3.7  CL 97*  --  100*  CO2 29  --  27  GLUCOSE 120*  --  93  BUN 13  --  16  CREATININE 1.65*  --  1.72*  CALCIUM 9.2  --  8.7*  MG  --  2.0  --    Assessment/Plan: 1. 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 Weakness has resolved.  -Neurology on-board; appreciate the thoughtful care of the mutual patient -ASA, clopidogrel -Cerebral angiogram yesterday -Continue home topiramate, PRN norco - PT consulted  Dispo: Disposition is deferred at this time, awaiting improvement of current medical problems.  Anticipated discharge in approximately 1-2 day(s).   The patient does have a  current PCP [name], MD) and does need an [facility] hospital follow-up appointment after discharge.  The patient does not know have transportation limitations that hinder transportation to clinic appointments.   LOS: 2 days   Gary Herrlich, MD 03/01/2016, 9:32 AM

## 2016-03-01 NOTE — Progress Notes (Signed)
Pt discharging at this time with family taking all personal belongings. IV discontinued, dry dressing. Discharge instructions provided with verbal understanding. No noted distress.

## 2016-03-01 NOTE — Progress Notes (Signed)
Pt ambulates assist x1 to the bathroom standby. No noted distress. No complaints of pain. Will continue to monitor.

## 2016-03-01 NOTE — Discharge Instructions (Signed)
Take aspirin 81mg  and plavix 75mg  everyday.   Plavix was send to your pharmacy.   Make sure to schedule an appointment to see Dr. Estanislado Pandy in 1-2 weeks.   Clopidogrel tablets What is this medicine? CLOPIDOGREL (kloh PID oh grel) helps to prevent blood clots. This medicine is used to prevent heart attack, stroke, or other vascular events in people who are at high risk. This medicine may be used for other purposes; ask your health care provider or pharmacist if you have questions. What should I tell my health care provider before I take this medicine? They need to know if you have any of the following conditions: -bleeding disorder -bleeding in the brain -planned surgery -stomach or intestinal ulcers -stroke or transient ischemic attack -an unusual or allergic reaction to clopidogrel, other medicines, foods, dyes, or preservatives -pregnant or trying to get pregnant -breast-feeding How should I use this medicine? Take this medicine by mouth with a drink of water. Follow the directions on the prescription label. You may take this medicine with or without food. Take your medicine at regular intervals. Do not take your medicine more often than directed. Talk to your pediatrician regarding the use of this medicine in children. Special care may be needed. Overdosage: If you think you have taken too much of this medicine contact a poison control center or emergency room at once. NOTE: This medicine is only for you. Do not share this medicine with others. What if I miss a dose? If you miss a dose, take it as soon as you can. If it is almost time for your next dose, take only that dose. Do not take double or extra doses. What may interact with this medicine? -aspirin -blood thinners like cilostazol, enoxaparin, ticlopidine, and warfarin -certain medicines for depression like citalopram, fluoxetine, and fluvoxamine -certain medicines for fungal infections like ketoconazole, fluconazole, and  voriconazole -certain medicines for HIV infection like delavirdine, efavirenz, and etravirine -certain medicines for seizures like felbamate, oxcarbazepine, and phenytoin -chloramphenicol -fluvastatin -isoniazid, INH -medicines for inflammation like ibuprofen and naproxen -modafinil -nicardipine -over-the counter supplements like echinacea, feverfew, fish oil, garlic, ginger, ginkgo, green tea, horse chestnut -quinine -stomach acid blockers like cimetidine, omeprazole, and esomeprazole -tamoxifen -tolbutamide -topiramate -torsemide This list may not describe all possible interactions. Give your health care provider a list of all the medicines, herbs, non-prescription drugs, or dietary supplements you use. Also tell them if you smoke, drink alcohol, or use illegal drugs. Some items may interact with your medicine. What should I watch for while using this medicine? Visit your doctor or health care professional for regular check ups. Do not stop taking your medicine unless your doctor tells you to. Notify your doctor or health care professional and seek emergency treatment if you develop breathing problems; changes in vision; chest pain; severe, sudden headache; pain, swelling, warmth in the leg; trouble speaking; sudden numbness or weakness of the face, arm or leg. These can be signs that your condition has gotten worse. If you are going to have surgery or dental work, tell your doctor or health care professional that you are taking this medicine. Certain genetic factors may reduce the effect of this medicine. Your doctor may use genetic tests to determine treatment. What side effects may I notice from receiving this medicine? Side effects that you should report to your doctor or health care professional as soon as possible: -allergic reactions like skin rash, itching or hives, swelling of the face, lips, or tongue -breathing problems -changes in  vision -fever -signs and symptoms of  bleeding such as bloody or black, tarry stools; red or dark-brown urine; spitting up blood or brown material that looks like coffee grounds; red spots on the skin; unusual bruising or bleeding from the eye, gums, or nose -sudden weakness -unusual bleeding or bruising Side effects that usually do not require medical attention (report to your doctor or health care professional if they continue or are bothersome): -constipation or diarrhea -headache -pain in back or joints -stomach upset This list may not describe all possible side effects. Call your doctor for medical advice about side effects. You may report side effects to FDA at 1-800-FDA-1088. Where should I keep my medicine? Keep out of the reach of children. Store at room temperature of 59 to 86 degrees F (15 to 30 degrees C). Throw away any unused medicine after the expiration date. NOTE: This sheet is a summary. It may not cover all possible information. If you have questions about this medicine, talk to your doctor, pharmacist, or health care provider.    2016, Elsevier/Gold Standard. (2013-04-05 16:34:37)

## 2016-03-02 LAB — HCV COMMENT:

## 2016-03-02 LAB — HEPATITIS C ANTIBODY (REFLEX): HCV Ab: 0.1 s/co ratio (ref 0.0–0.9)

## 2016-03-03 LAB — HEMOGLOBIN A1C
HEMOGLOBIN A1C: 6.4 % — AB (ref 4.8–5.6)
MEAN PLASMA GLUCOSE: 137 mg/dL

## 2016-03-04 ENCOUNTER — Other Ambulatory Visit: Payer: Self-pay | Admitting: Neurology

## 2016-03-04 DIAGNOSIS — R51 Headache: Principal | ICD-10-CM

## 2016-03-04 DIAGNOSIS — R519 Headache, unspecified: Secondary | ICD-10-CM

## 2016-03-06 ENCOUNTER — Ambulatory Visit
Admission: RE | Admit: 2016-03-06 | Discharge: 2016-03-06 | Disposition: A | Discharge status: Home / Self Care | Payer: PPO | Source: Ambulatory Visit | Attending: Neurology | Admitting: Neurology

## 2016-03-06 DIAGNOSIS — R413 Other amnesia: Secondary | ICD-10-CM | POA: Insufficient documentation

## 2016-03-06 DIAGNOSIS — R42 Dizziness and giddiness: Secondary | ICD-10-CM | POA: Diagnosis not present

## 2016-03-06 DIAGNOSIS — R519 Headache, unspecified: Secondary | ICD-10-CM

## 2016-03-06 DIAGNOSIS — R51 Headache: Secondary | ICD-10-CM | POA: Diagnosis not present

## 2016-03-11 ENCOUNTER — Other Ambulatory Visit (HOSPITAL_COMMUNITY): Payer: Self-pay | Admitting: Interventional Radiology

## 2016-03-11 DIAGNOSIS — I651 Occlusion and stenosis of basilar artery: Secondary | ICD-10-CM

## 2016-03-12 DIAGNOSIS — R51 Headache: Secondary | ICD-10-CM | POA: Diagnosis not present

## 2016-03-12 DIAGNOSIS — I10 Essential (primary) hypertension: Secondary | ICD-10-CM | POA: Diagnosis not present

## 2016-03-18 DIAGNOSIS — D472 Monoclonal gammopathy: Secondary | ICD-10-CM | POA: Diagnosis not present

## 2016-03-18 DIAGNOSIS — I10 Essential (primary) hypertension: Secondary | ICD-10-CM | POA: Diagnosis not present

## 2016-03-18 DIAGNOSIS — N2581 Secondary hyperparathyroidism of renal origin: Secondary | ICD-10-CM | POA: Diagnosis not present

## 2016-03-18 DIAGNOSIS — N183 Chronic kidney disease, stage 3 (moderate): Secondary | ICD-10-CM | POA: Diagnosis not present

## 2016-03-25 ENCOUNTER — Ambulatory Visit (HOSPITAL_COMMUNITY): Payer: PPO

## 2016-03-27 ENCOUNTER — Ambulatory Visit: Admission: RE | Admit: 2016-03-27 | Payer: PPO | Source: Ambulatory Visit | Admitting: Ophthalmology

## 2016-03-27 ENCOUNTER — Encounter: Admission: RE | Payer: Self-pay | Source: Ambulatory Visit

## 2016-03-27 DIAGNOSIS — M542 Cervicalgia: Secondary | ICD-10-CM | POA: Diagnosis not present

## 2016-03-27 DIAGNOSIS — G8929 Other chronic pain: Secondary | ICD-10-CM | POA: Diagnosis not present

## 2016-03-27 DIAGNOSIS — R51 Headache: Secondary | ICD-10-CM | POA: Diagnosis not present

## 2016-03-27 DIAGNOSIS — E119 Type 2 diabetes mellitus without complications: Secondary | ICD-10-CM | POA: Diagnosis not present

## 2016-03-27 DIAGNOSIS — F4321 Adjustment disorder with depressed mood: Secondary | ICD-10-CM | POA: Diagnosis not present

## 2016-03-27 DIAGNOSIS — I10 Essential (primary) hypertension: Secondary | ICD-10-CM | POA: Diagnosis not present

## 2016-03-27 SURGERY — PHACOEMULSIFICATION, CATARACT, WITH IOL INSERTION
Anesthesia: Choice | Laterality: Left

## 2016-04-09 ENCOUNTER — Ambulatory Visit (HOSPITAL_COMMUNITY): Admission: RE | Admit: 2016-04-09 | Payer: PPO | Source: Ambulatory Visit

## 2016-04-11 ENCOUNTER — Telehealth (HOSPITAL_COMMUNITY): Payer: Self-pay

## 2016-04-11 NOTE — Telephone Encounter (Signed)
Called to reschedule, left a vm for pt to return call. AW

## 2016-04-14 ENCOUNTER — Ambulatory Visit (HOSPITAL_COMMUNITY): Payer: PPO

## 2016-04-17 ENCOUNTER — Ambulatory Visit (HOSPITAL_COMMUNITY)
Admission: RE | Admit: 2016-04-17 | Discharge: 2016-04-17 | Disposition: A | Discharge status: Home / Self Care | Payer: PPO | Source: Ambulatory Visit | Attending: Interventional Radiology | Admitting: Interventional Radiology

## 2016-04-17 DIAGNOSIS — R51 Headache: Secondary | ICD-10-CM | POA: Diagnosis not present

## 2016-04-17 DIAGNOSIS — I651 Occlusion and stenosis of basilar artery: Secondary | ICD-10-CM

## 2016-04-28 ENCOUNTER — Ambulatory Visit (INDEPENDENT_AMBULATORY_CARE_PROVIDER_SITE_OTHER): Payer: Self-pay | Admitting: Neurology

## 2016-04-28 ENCOUNTER — Ambulatory Visit (INDEPENDENT_AMBULATORY_CARE_PROVIDER_SITE_OTHER): Payer: PPO | Admitting: Neurology

## 2016-04-28 ENCOUNTER — Encounter: Payer: Self-pay | Admitting: Neurology

## 2016-04-28 VITALS — BP 111/67 | HR 81 | Ht 68.0 in | Wt 217.4 lb

## 2016-04-28 DIAGNOSIS — R51 Headache: Secondary | ICD-10-CM

## 2016-04-28 DIAGNOSIS — R519 Headache, unspecified: Secondary | ICD-10-CM

## 2016-04-28 DIAGNOSIS — G44209 Tension-type headache, unspecified, not intractable: Secondary | ICD-10-CM

## 2016-04-28 DIAGNOSIS — G509 Disorder of trigeminal nerve, unspecified: Secondary | ICD-10-CM | POA: Diagnosis not present

## 2016-04-28 DIAGNOSIS — F410 Panic disorder [episodic paroxysmal anxiety] without agoraphobia: Secondary | ICD-10-CM | POA: Diagnosis not present

## 2016-04-28 DIAGNOSIS — G501 Atypical facial pain: Secondary | ICD-10-CM

## 2016-04-28 MED ORDER — LIDOCAINE 5 % EX OINT
1.0000 | TOPICAL_OINTMENT | Freq: Four times a day (QID) | CUTANEOUS | Status: DC | PRN
Start: 2016-04-28 — End: 2016-05-15

## 2016-04-28 MED ORDER — ALPRAZOLAM 0.25 MG PO TABS: 0.2500 mg | ORAL_TABLET | Freq: Two times a day (BID) | ORAL | Status: DC | PRN

## 2016-04-28 NOTE — Progress Notes (Addendum)
[facility]    Provider:  Dr Gary Chang Referring Provider: Luanne Bras MD Primary Care Physician:  Gary Pink, MD  CC:  headaches  HPI:  Gary Chang is a 82 y.o. male here as a referral from Dr. Estanislado Chang for Headache. He is accompanied by his wife and daughter who provide much information. He has a PMHx of T2DM, HTN, HLD, Anxiety and panic attacks, COPD, depression, Lumbar laminectomy/decompression microdiscectomy, AAA s/p repair, peripheral neuropathy, degenerative cervical spine disease s/p surgery, gait difficulty, dizziness,  chronic headaches for many years, memory complaints,  Headache started more than 2-3 years ago with lightning bolts across the top of the head. The pain starts in the right temple and travels across the top of the head in an arc to the left temple area. He also has a more "regular" tension-type headache.  The "regular" headache is on the top of the head, pain, not throbbing or pounding. It just hurts, he can't describe it. Maybe pressure. No inciting event to the headache, no history of head trauma, illness, surgery or any preceding event to the start of the headaches. He has regular tension headaches 10/30 days a month that last a few hours but not severe and easy treated. The shooting across the top of the head is every day and multiple times a day. He denies any occipital pain. The shooting pain starts on the right temple and arches over to the left temple. Doesn't happen when asleep. He does not wake up with headaches. The regular headache is worse when watching TV. 2 weeks ago he had a panic attack, he was on his right side and he has been having worsening panic attacks, he has anxiety, panic, breaks out in a sweat, anxious, he used to have anxiety attacks in the past and claustrophobia. He has depression and anxiety. The panic attacks are worsening. The tension headache lasts a few hours sometimes and they go away easily with oxycodone.  No double vision, no problems with chewing, no fevers, no new arthralgias or myalgias. The shooting pain headaches are paroxysmal, brief, no triggers, no lacrimation, no rhinorrhea, no eye injection. He has tried multiple medications and lidoderm patches without relief, he has another neurologist who he has been seeing for at least several years for these headaches. No changes in frequency or severity of headaches in several years. Today patient is not so much concerned with the headaches as with the panic attacks, he says if he could get rid of the panic attacks then the headaches wouldn't bother him as much. He is on multiple medications for mood problems per his family and they want help with the panic attacks. No light sensitivity or nausea or vomiting or other migrainous features.   Current and previous medications he has tried that are also used in headache management(per review of records): Topamax, Lidoderm patches, neurontin, celexa, flexeril, metoprolol, prednisone, topamax, zofran, lyrica  Reviewed notes, labs and imaging from outside physicians, which showed:   MRI of the brain 02/28/2016: IMPRESSION: 1. No acute intracranial abnormality. 2. Mild chronic small vessel ischemic disease and moderate cerebral atrophy. 3. Suspected occlusion (or severely diminished flow) of the distal right vertebral artery. 4. Severe mid basilar artery stenosis. 5. Anterior circulation branch vessel atherosclerosis without evidence of significant proximal stenosis.  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  Approximately 30% narrowing of the left vertebral artery at its origin. Slow flow noted to the level of the cranial skull base  where there is reconstitution of the vessel from the ascending cervical branch of the left thyrocervical trunk, with associated approximately 60% stenoses at the point of entry of the ascending cervical branch into the left vertebral artery.  Severe focal stenosis of the mid basilar artery.  Review of Systems: Patient complains of symptoms per HPI as well as the following symptoms: neck pan, neck stiffness, memory loss, dizziness, headache, confusion, depression, anxiety. Pertinent negatives per HPI. All others negative.   Social History   Social History  . Marital Status: Married    Spouse Name: Gary Chang  . Number of Children: 2  . Years of Education: HS   Occupational History  . Retired    Social History Main Topics  . Smoking status: Former Smoker    Types: Pipe  . Smokeless tobacco: Never Used  . Alcohol Use: Yes     Comment: occ 1-2 beers a month  . Drug Use: No  . Sexual Activity: Not on file   Other Topics Concern  . Not on file   Social History Narrative   Patient lives at home with his spouse.   Caffeine Use: 1-2 cups daily    Family History  Problem Relation Age of Onset  . Anesthesia problems Neg Hx   . Hypotension Neg Hx   . Malignant hyperthermia Neg Hx   . Pseudochol deficiency Neg Hx   . Heart Problems Mother   . Tuberculosis Father     Past Medical History  Diagnosis Date  . High cholesterol   . Diabetes mellitus     tingling in feet  . Arthritis   . GERD (gastroesophageal reflux disease)   . H/O hiatal hernia   . Enlarged prostate     laser surgery  . Hypertension     pcp  dr Gary Chang  [place]  . Cancer ([place])     skin ca removed  . Anxiety   . Allergic state   . Abdominal aortic aneurysm (AAA) ([place])   . COPD (chronic obstructive pulmonary disease) ([place])   . Depression   . Kidney stones   . Melanoma ([place])   . Renal artery aneurysm [facility])     Past Surgical History  Procedure Laterality Date  . Hernia repair    . Kidney  stone surgery    . Abdominal aortic aneurysm repair    . Back surgery      lumbar  ,cervical surgeries  . Lumbar laminectomy/decompression microdiscectomy  12/10/2011    Procedure: LUMBAR LAMINECTOMY/DECOMPRESSION MICRODISCECTOMY;  Surgeon: Ophelia Charter;  Location: [facility];  Service: Neurosurgery;;  Thoracic One-Two Laminectomy  . Esophagogastroduodenoscopy (egd) with propofol N/A 02/11/2016    Procedure: ESOPHAGOGASTRODUODENOSCOPY (EGD) WITH PROPOFOL;  Surgeon: Hulen Luster, MD;  Location: [facility] ENDOSCOPY;  Service: Gastroenterology;  Laterality: N/A;    Current Outpatient Prescriptions  Medication Sig Dispense Refill  . ALPRAZolam (XANAX) 0.25 MG tablet Take 1 tablet (0.25 mg total) by mouth 2 (two) times daily as needed. 45 tablet 2  . amLODipine (NORVASC) 10 MG tablet Take 10 mg by mouth every evening.      Marland Kitchen aspirin EC 81 MG tablet Take 81 mg by mouth at bedtime.      Marland Kitchen b complex vitamins tablet Take 1 tablet by mouth daily.    Marland Kitchen  buPROPion (WELLBUTRIN XL) 300 MG 24 hr tablet Take 300 mg by mouth daily.    . Cholecalciferol (VITAMIN D3 SUPER STRENGTH) 2000 UNITS TABS Take 2,000 Units by mouth daily.      . citalopram (CELEXA) 20 MG tablet Take 20 mg by mouth at bedtime.     . clopidogrel (PLAVIX) 75 MG tablet Take 1 tablet (75 mg total) by mouth daily. 30 tablet 1  . gabapentin (NEURONTIN) 600 MG tablet Take 1 tablet by mouth daily.    Marland Kitchen glipiZIDE (GLUCOTROL XL) 2.5 MG 24 hr tablet Take 2.5 mg by mouth at bedtime.      . hydrALAZINE (APRESOLINE) 100 MG tablet Take 1 tablet by mouth 2 (two) times daily.    . hydrochlorothiazide (HYDRODIURIL) 25 MG tablet Take 25 mg by mouth daily.    Marland Kitchen HYDROcodone-acetaminophen (NORCO/VICODIN) 5-325 MG per tablet Take 1 tablet by mouth as needed.    . lidocaine (XYLOCAINE) 5 % ointment Apply 1 application topically 4 (four) times daily as needed. 35.44 g 12  . losartan (COZAAR) 100 MG tablet Take 100 mg by mouth every morning.      . magnesium  oxide (MAG-OX) 400 MG tablet Take 400 mg by mouth daily.    . metoprolol succinate (TOPROL-XL) 25 MG 24 hr tablet Take 25 mg by mouth at bedtime.      . potassium chloride SA (K-DUR,KLOR-CON) 20 MEQ tablet Take 20 mEq by mouth every morning.      . tamsulosin (FLOMAX) 0.4 MG CAPS capsule Take 0.4 mg by mouth daily.     Marland Kitchen topiramate (TOPAMAX) 25 MG tablet Take 1 tablet (25 mg total) by mouth 2 (two) times daily. 60 tablet 3   No current facility-administered medications for this visit.    Allergies as of 04/28/2016 - Review Complete 02/29/2016  Allergen Reaction Noted  . Statins Other (See Comments) 12/08/2011    Vitals: There were no vitals taken for this visit. Last Weight:  Wt Readings from Last 1 Encounters:  04/28/16 217 lb 6.4 oz (98.612 kg)   Last Height:   Ht Readings from Last 1 Encounters:  04/28/16 '5\' 8"'  (1.727 m)   Weight 217.4 OD 20/70 OS 20/50 uncorrected BP 111/66 P 81  Physical exam: Exam: Gen: NAD, conversant, well nourised, obese, well groomed                     Eyes: Conjunctivae clear without exudates or hemorrhage  Neuro: Detailed Neurologic Exam  Speech:    Speech is normal; fluent and spontaneous with normal comprehension.  Cognition:    The patient is oriented to person, place, situation Cranial Nerves:    The pupils are equal, round, and reactive to light.  Visual fields are grossly normal. Extraocular movements are intact. Trigeminal sensation is intact and the muscles of mastication are normal. The face is symmetric. The palate elevates in the midline. Hearing decreased. Voice is normal. Shoulder shrug is normal. The tongue has normal motion without fasciculations.   Coordination:    No dysmetria noted    Strength:    Moving extremities equally and symmetrically without apparent deficit    Assessment/Plan:  82 y.o. male here as a referral from Dr. Patrecia Pour for chronic headache. He is accompanied by his wife and daughter who provide much  information. He has a complicated PMHx of P7TG, HTN, HLD, Anxiety and panic attacks, COPD, depression, Lumbar laminectomy/decompression microdiscectomy, AAA s/p repair, peripheral neuropathy, degenerative cervical spine disease s/p surgery,  gait difficulty, dizziness,  chronic headaches for many years, memory complaints,  Headache started 2-3 years ago with lightning bolts across the top of the head from the right temple to the left temple. He also has tension-type headaches and panic attacks.  -I had a long talk with family regarding panic attacks. Patient has a history of depression, anxiety and panic attacks. They want to discuss management of panic attacks. At this point I feel he should be treated by a psychiatric geriatrician for his long history of mood disorder and panic attack especially given he is already on multiple medications already including celexa, Xanax and wellbutrin. They are very agreeable and I have referred them to Dr. Casimiro Needle. I will refill his Xanax as a ONE-TIME prescription until they can see Dr. Casimiro Needle, Xanax was already on his medication list.  - He has tension type headaches which are not so bothersome and are brief. He c/o daily paroxysmal lightning type pain that travels in an arc across the top of his head from the right temple to the left temple. I am not sure about the etiology of this pain as this is not in a distribution of any one nerve however the description does sound neuralgic possibly trigeminal. He denies any occipital pain or occipital neuralgia.  He is already on multiple medications we use in headache management including neurontin, topamax, mag-oxide, metoprolol and I do not want to add anything else to his polypharmacy. I performed nerve blocks today with lidocaine, steroid and bupivicaine on the bilateral trigeminal areas and provided topical lidoderm that he can rub into the temples as needed. He can return for repeat nerve blocks if necessary.I recommend  cognitive behavioral therapy and possibly devices such as Cephaly for someone who appears as though he has tried and failed multiple medications for his headaches. There does not appear to be sleep apnea(this can be a cause of tension-type pressure headaches).  - Happy to see him again for nerve blocks. No suspicion for temporal arteritis as these headaches are chronic and unchanged for years per family without any other suspicious associated symptoms. Lab is closed as he is being seen very late in the office, I will place an esr and crp order for him to come back or her can have it done at another physician. If his vision changes, proceed to ED immediately.  NERVE BLOCK PROCEDURE NOTE  Procedure: Patient was consented for bilateral trigeminal nerve blocks. A solution containing ingredients below were prepared in 2 3-CC syringes with 30 gauge 1/2 inch needle.   Target areas in the temporal regions were identified via palpation and pain response.The sites junctions were sterilized with alcohol wipes.  The contents of each syringe was injected in a fanlike fashion. The headache improved. Patient tolerated the procedure well and no complications were noted.    Consent was provided below and patient acknowledged understanding:   Depo-Medrol 21m/ml NDC [address]-67 Expiration date: 12/2015 Lot number: [id number] Nerve Block:  Lidocaine 2% 683m Lot: [id number] 10/2019   NDC: [id number] 0.5% 34m62mLot: [id number]  Expiration: 07/22/2017  NDC: [id number]-medrol [id number] 2 vials  Lot: [id number]: 01/2017  NDC: [id number] Ill  [facility] [phone] [place] [phone] was a prolonged visit with extensive history taking, discussion with patient and family members, review of data and medical decision making. A total of 75  minutes was spent  face-to-face with this patient. Over half this time was spent on counseling patient on the atypical facial pain/neuralgia, panic attacks, tension headaches diagnosis and different diagnostic and therapeutic options available.

## 2016-04-28 NOTE — Patient Instructions (Signed)
Overall you are doing fairly well but I do want to suggest a few things today:   Remember to drink plenty of fluid, eat healthy meals and do not skip any meals. Try to eat protein with a every meal and eat a healthy snack such as fruit or nuts in between meals. Try to keep a regular sleep-wake schedule and try to exercise daily, particularly in the form of walking, 20-30 minutes a day, if you can.   As far as your medications are concerned, I would like to suggest; Topical lidocaine as needed  I would like to see you back as needed, sooner if we need to. Please call us with any interim questions, concerns, problems, updates or refill requests.   Our phone number is [phone]. We also have an after hours call service for urgent matters and there is a physician on-call for urgent questions. For any emergencies you know to call 911 or go to the nearest emergency room

## 2016-04-29 ENCOUNTER — Telehealth: Payer: Self-pay | Admitting: Neurology

## 2016-04-29 NOTE — Telephone Encounter (Addendum)
Ashley/[facility] called said pt was seen for a consult on 04/28/16 and need notes faxed to [phone]. This message came via answering service.

## 2016-04-29 NOTE — Telephone Encounter (Signed)
Awaiting for note to be closed by Dr [name].

## 2016-04-29 NOTE — Progress Notes (Signed)
Dr [name] saw this patient upon my request and will do this consult note

## 2016-04-30 ENCOUNTER — Telehealth: Payer: Self-pay | Admitting: *Deleted

## 2016-04-30 ENCOUNTER — Encounter: Payer: Self-pay | Admitting: Neurology

## 2016-04-30 DIAGNOSIS — G501 Atypical facial pain: Secondary | ICD-10-CM | POA: Insufficient documentation

## 2016-04-30 DIAGNOSIS — G44209 Tension-type headache, unspecified, not intractable: Secondary | ICD-10-CM | POA: Insufficient documentation

## 2016-04-30 NOTE — Telephone Encounter (Signed)
Jennifer/[facility]eshwar office requesting pt assessment/plan its not stated in the notes.

## 2016-04-30 NOTE — Telephone Encounter (Addendum)
Dr [name]- FYI  Returned Jennifer's call. LVM and advised per Dr [name] that she will have her note completed by today. She had done nerve blocks for pt and plan is for him to come back for nerve blocks in future if needed. Gave [facility] phone number if she has further questions.

## 2016-04-30 NOTE — Telephone Encounter (Signed)
Dr [name]- When you get a change, can you complete his OV note? Dr Estanislado Pandy office requesting, thank you!

## 2016-04-30 NOTE — Telephone Encounter (Signed)
Jennifer/Deveshwar office called requesting pt assessment an plan. Please advise [phone] or fax [phone].

## 2016-05-01 NOTE — Telephone Encounter (Signed)
Faxed note as requested. Received fax confirmation.

## 2016-05-05 NOTE — Telephone Encounter (Signed)
Dr [name]- FYI  Called daughter, Jeani Hawking back. Made appt for 05/07/16 at 1130am, check in 1115am for nerve block. She verbalized understanding and appreciation.

## 2016-05-05 NOTE — Telephone Encounter (Signed)
Pt's daughter called to schedule nerve block. Please call  [place] [phone] Henriette [phone] * [phone]  NEEDS TO MAKE APPT FOR DAD, FOR CHRONIC HEADACHES Y

## 2016-05-07 ENCOUNTER — Ambulatory Visit (INDEPENDENT_AMBULATORY_CARE_PROVIDER_SITE_OTHER): Payer: PPO | Admitting: Neurology

## 2016-05-07 ENCOUNTER — Encounter: Payer: Self-pay | Admitting: Neurology

## 2016-05-07 ENCOUNTER — Encounter: Payer: Self-pay | Admitting: *Deleted

## 2016-05-07 VITALS — BP 141/74 | HR 83 | Ht 68.0 in | Wt 211.6 lb

## 2016-05-07 DIAGNOSIS — R51 Headache: Secondary | ICD-10-CM | POA: Diagnosis not present

## 2016-05-07 DIAGNOSIS — R519 Headache, unspecified: Secondary | ICD-10-CM

## 2016-05-07 NOTE — Progress Notes (Signed)
Faxed signed record release form by pt to [facility] to receive records per Dr [name] request. Fax: [phone]. Received fax confirmation.

## 2016-05-07 NOTE — Progress Notes (Signed)
    NERVE BLOCK PROCEDURE NOTE  History: Intractable headache  Procedure: Patient was consented for bilateral occipital and bilateral trigeminal nerve blocks. A solution containing listed below was prepared in 4 3-CC syringes with 30 gauge 1/2 inch needle.   14 Target areas in the occipital, suboccipital and temporal regions were identified via palpation and pain response.The sites junctions were sterilized with alcohol wipes. The contents of each syringe was injected in a fanlike fashion. The headache improved from 7/10 to 1/10. Patient tolerated the procedure well and no complications were noted.    Consent was provided below and patient acknowledged understanding:   Depo-Medrol 80mg /ml NDC [id number] 2 vials 37ml  Expiration date: 11/2016 Lot number: [id number] Expiration date: 12/2016 Lot number: [id number]  Bupivicaine 50mg /63ml NDC [id number]  65ml Expiration date: 07/22/2017 Lot number: [id number] NDC: [id number]  Lidocaine 2% NDC [id number] 36ml  Expiration date: 10/2019 Lot number: [id number]    What to expect afterwards?  Immediately after the injection, the back of your head may feel warm and numb. You may also experience reduction in the pain. The local anaesthetic wears off in a few hours and the steroid usually takes  3-7 days to take effect.   The pain relief is vary variable and can last from a few days to several months. Some patients do not experience any pain relief. Hence it is difficult to predict the outcome of the injection treatment in a particular patient.   There may be some discomfort at the injection site for a couple of days after treatment, however, this should settle quite quickly. We advise you to take things easy for the rest of the day. Continue taking your pain medication as advised by your consultant or until you feel benefit from the treatment.   What are the side effects / complications?  Common   Soreness / bruising at the injection site.    Temporary increase (up to 7 days) in pain following procedure.   Rare   Bleeding   Infection at the injection site   Allergic reaction   New pain   Worsening pain

## 2016-05-12 ENCOUNTER — Other Ambulatory Visit: Payer: Self-pay | Admitting: Neurology

## 2016-05-15 ENCOUNTER — Other Ambulatory Visit (HOSPITAL_COMMUNITY): Payer: Self-pay | Admitting: Interventional Radiology

## 2016-05-15 ENCOUNTER — Other Ambulatory Visit: Payer: Self-pay | Admitting: General Surgery

## 2016-05-15 DIAGNOSIS — I6509 Occlusion and stenosis of unspecified vertebral artery: Secondary | ICD-10-CM

## 2016-05-16 ENCOUNTER — Encounter (HOSPITAL_COMMUNITY): Payer: Self-pay

## 2016-05-16 ENCOUNTER — Ambulatory Visit (HOSPITAL_COMMUNITY)
Admission: RE | Admit: 2016-05-16 | Discharge: 2016-05-16 | Disposition: A | Discharge status: Home / Self Care | Payer: PPO | Source: Ambulatory Visit | Attending: Interventional Radiology | Admitting: Interventional Radiology

## 2016-05-16 DIAGNOSIS — I6503 Occlusion and stenosis of bilateral vertebral arteries: Secondary | ICD-10-CM | POA: Diagnosis not present

## 2016-05-16 DIAGNOSIS — R51 Headache: Secondary | ICD-10-CM | POA: Diagnosis not present

## 2016-05-16 DIAGNOSIS — Z87891 Personal history of nicotine dependence: Secondary | ICD-10-CM | POA: Diagnosis not present

## 2016-05-16 DIAGNOSIS — Z888 Allergy status to other drugs, medicaments and biological substances status: Secondary | ICD-10-CM | POA: Insufficient documentation

## 2016-05-16 DIAGNOSIS — Z85820 Personal history of malignant melanoma of skin: Secondary | ICD-10-CM | POA: Diagnosis not present

## 2016-05-16 DIAGNOSIS — I6509 Occlusion and stenosis of unspecified vertebral artery: Secondary | ICD-10-CM

## 2016-05-16 DIAGNOSIS — E7800 Pure hypercholesterolemia, unspecified: Secondary | ICD-10-CM | POA: Diagnosis not present

## 2016-05-16 DIAGNOSIS — E119 Type 2 diabetes mellitus without complications: Secondary | ICD-10-CM | POA: Diagnosis not present

## 2016-05-16 DIAGNOSIS — Z7902 Long term (current) use of antithrombotics/antiplatelets: Secondary | ICD-10-CM | POA: Diagnosis not present

## 2016-05-16 DIAGNOSIS — F329 Major depressive disorder, single episode, unspecified: Secondary | ICD-10-CM | POA: Diagnosis not present

## 2016-05-16 DIAGNOSIS — G319 Degenerative disease of nervous system, unspecified: Secondary | ICD-10-CM | POA: Diagnosis not present

## 2016-05-16 DIAGNOSIS — J449 Chronic obstructive pulmonary disease, unspecified: Secondary | ICD-10-CM | POA: Diagnosis not present

## 2016-05-16 DIAGNOSIS — Z7982 Long term (current) use of aspirin: Secondary | ICD-10-CM | POA: Diagnosis not present

## 2016-05-16 DIAGNOSIS — G500 Trigeminal neuralgia: Secondary | ICD-10-CM

## 2016-05-16 DIAGNOSIS — K219 Gastro-esophageal reflux disease without esophagitis: Secondary | ICD-10-CM | POA: Insufficient documentation

## 2016-05-16 DIAGNOSIS — Z7984 Long term (current) use of oral hypoglycemic drugs: Secondary | ICD-10-CM | POA: Diagnosis not present

## 2016-05-16 DIAGNOSIS — I10 Essential (primary) hypertension: Secondary | ICD-10-CM | POA: Diagnosis not present

## 2016-05-16 DIAGNOSIS — F419 Anxiety disorder, unspecified: Secondary | ICD-10-CM | POA: Diagnosis not present

## 2016-05-16 DIAGNOSIS — M47812 Spondylosis without myelopathy or radiculopathy, cervical region: Secondary | ICD-10-CM | POA: Insufficient documentation

## 2016-05-16 DIAGNOSIS — Z79899 Other long term (current) drug therapy: Secondary | ICD-10-CM | POA: Diagnosis not present

## 2016-05-16 LAB — BASIC METABOLIC PANEL
Anion gap: 8 (ref 5–15)
BUN: 14 mg/dL (ref 6–20)
CHLORIDE: 99 mmol/L — AB (ref 101–111)
CO2: 30 mmol/L (ref 22–32)
Calcium: 9.1 mg/dL (ref 8.9–10.3)
Creatinine, Ser: 1.51 mg/dL — ABNORMAL HIGH (ref 0.61–1.24)
GFR, EST AFRICAN AMERICAN: 47 mL/min — AB (ref >60)
GFR, EST NON AFRICAN AMERICAN: 40 mL/min — AB (ref >60)
Glucose, Bld: 115 mg/dL — ABNORMAL HIGH (ref 65–99)
POTASSIUM: 3.7 mmol/L (ref 3.5–5.1)
SODIUM: 137 mmol/L (ref 135–145)

## 2016-05-16 LAB — CBC
HEMATOCRIT: 48.1 % (ref 39.0–52.0)
Hemoglobin: 15.6 g/dL (ref 13.0–17.0)
MCH: 28.4 pg (ref 26.0–34.0)
MCHC: 32.4 g/dL (ref 30.0–36.0)
MCV: 87.5 fL (ref 78.0–100.0)
PLATELETS: 157 10*3/uL (ref 150–400)
RBC: 5.5 MIL/uL (ref 4.22–5.81)
RDW: 14.8 % (ref 11.5–15.5)
WBC: 9.9 10*3/uL (ref 4.0–10.5)

## 2016-05-16 LAB — PROTIME-INR
INR: 1.11 (ref 0.00–1.49)
Prothrombin Time: 14.5 seconds (ref 11.6–15.2)

## 2016-05-16 LAB — APTT: APTT: 29 s (ref 24–37)

## 2016-05-16 MED ORDER — SODIUM CHLORIDE 0.9 % IV SOLN
INTRAVENOUS | Status: DC
  Administered 2016-05-16: 10:00:00 via INTRAVENOUS

## 2016-05-16 MED ORDER — LIDOCAINE HCL 1 % IJ SOLN
INTRAMUSCULAR | Status: AC
  Filled 2016-05-16: qty 20

## 2016-05-16 MED ORDER — ALPRAZOLAM 0.25 MG PO TABS
ORAL_TABLET | ORAL | Status: AC
  Filled 2016-05-16: qty 1

## 2016-05-16 MED ORDER — HYDROCODONE-ACETAMINOPHEN 5-325 MG PO TABS
1.0000 | ORAL_TABLET | Freq: Once | ORAL | Status: AC
  Administered 2016-05-16: 1 via ORAL

## 2016-05-16 MED ORDER — ALPRAZOLAM 0.25 MG PO TABS
0.2500 mg | ORAL_TABLET | Freq: Once | ORAL | Status: AC
  Administered 2016-05-16: 0.25 mg via ORAL

## 2016-05-16 MED ORDER — MIDAZOLAM HCL 2 MG/2ML IJ SOLN
INTRAMUSCULAR | Status: AC
  Filled 2016-05-16: qty 2

## 2016-05-16 MED ORDER — HYDROCODONE-ACETAMINOPHEN 5-325 MG PO TABS
ORAL_TABLET | ORAL | Status: AC
  Filled 2016-05-16: qty 1

## 2016-05-16 MED ORDER — HEPARIN SOD (PORK) LOCK FLUSH 100 UNIT/ML IV SOLN
INTRAVENOUS | Status: AC
  Filled 2016-05-16: qty 10

## 2016-05-16 MED ORDER — FENTANYL CITRATE (PF) 100 MCG/2ML IJ SOLN
INTRAMUSCULAR | Status: AC
  Filled 2016-05-16: qty 2

## 2016-05-16 MED ORDER — SODIUM CHLORIDE 0.9 % IV BOLUS (SEPSIS): 250.0000 mL | Freq: Once | INTRAVENOUS | Status: DC

## 2016-05-16 MED ORDER — GADOBENATE DIMEGLUMINE 529 MG/ML IV SOLN
10.0000 mL | Freq: Once | INTRAVENOUS | Status: AC | PRN
  Administered 2016-05-16: 10 mL via INTRAVENOUS

## 2016-05-16 NOTE — H&P (Signed)
Chief Complaint: headaches, h/o VA stenosis  Referring Physician:Dr. Antony Contras  Supervising Physician: Luanne Bras  Patient Status:  Out-pt  HPI: Gary Chang is an 82 y.o. male who has a history of VA stenosis and has had a prior cerebral angiogram for evaluation.  He saw Dr. Estanislado Pandy in April 2017 due to horrible headaches.  He was referred to neurology to rule out any other complicating causes.  He was given a trigeminal nerve block, but this has not seemed to help.  He has been brought in today for another angiogram to further follow up on his headaches.  Past Medical History:  Past Medical History  Diagnosis Date  . High cholesterol   . Diabetes mellitus     tingling in feet  . Arthritis   . GERD (gastroesophageal reflux disease)   . H/O hiatal hernia   . Enlarged prostate     laser surgery  . Hypertension     pcp  dr Jeneen Rinks [place]  [place]  . Cancer ([place])     skin ca removed  . Anxiety   . Allergic state   . Abdominal aortic aneurysm (AAA) ([place])   . COPD (chronic obstructive pulmonary disease) ([place])   . Depression   . Kidney stones   . Melanoma ([place])   . Renal artery aneurysm [facility])     Past Surgical History:  Past Surgical History  Procedure Laterality Date  . Hernia repair    . Kidney stone surgery    . Abdominal aortic aneurysm repair    . Back surgery      lumbar  ,cervical surgeries  . Lumbar laminectomy/decompression microdiscectomy  12/10/2011    Procedure: LUMBAR LAMINECTOMY/DECOMPRESSION MICRODISCECTOMY;  Surgeon: Ophelia Charter;  Location: [facility] NEURO ORS;  Service: Neurosurgery;;  Thoracic One-Two Laminectomy  . Esophagogastroduodenoscopy (egd) with propofol N/A 02/11/2016    Procedure: ESOPHAGOGASTRODUODENOSCOPY (EGD) WITH PROPOFOL;  Surgeon: Hulen Luster, MD;  Location: [facility] ENDOSCOPY;  Service: Gastroenterology;  Laterality: N/A;    Family History:  Family History  Problem Relation Age of Onset  . Anesthesia problems Neg Hx    . Hypotension Neg Hx   . Malignant hyperthermia Neg Hx   . Pseudochol deficiency Neg Hx   . Heart Problems Mother   . Tuberculosis Father     Social History:  reports that he has quit smoking. His smoking use included Pipe. He has never used smokeless tobacco. He reports that he drinks alcohol. He reports that he does not use illicit drugs.  Allergies:  Allergies  Allergen Reactions  . Ezetimibe Other (See Comments)  . Statins Other (See Comments)    "Numbness and tingling"    Medications:   Medication List    ASK your doctor about these medications        ALPRAZolam 0.25 MG tablet  Commonly known as:  XANAX  Take 1 tablet (0.25 mg total) by mouth 2 (two) times daily as needed.     amLODipine 10 MG tablet  Commonly known as:  NORVASC  Take 10 mg by mouth every morning.     aspirin EC 81 MG tablet  Take 81 mg by mouth at bedtime.     b complex vitamins tablet  Take 1 tablet by mouth every morning.     buPROPion 300 MG 24 hr tablet  Commonly known as:  WELLBUTRIN XL  Take 300 mg by mouth every morning.     citalopram 20 MG tablet  Commonly known as:  CELEXA  Take 20 mg by mouth at bedtime.     clopidogrel 75 MG tablet  Commonly known as:  PLAVIX  Take 1 tablet (75 mg total) by mouth daily.     donepezil 5 MG tablet  Commonly known as:  ARICEPT  Take 5 mg by mouth every morning.     glipiZIDE 2.5 MG 24 hr tablet  Commonly known as:  GLUCOTROL XL  Take 2.5 mg by mouth at bedtime.     GLUCOSAMINE CHONDROITIN JOINT Tabs  Take 2,000 mg by mouth daily.     hydrALAZINE 100 MG tablet  Commonly known as:  APRESOLINE  Take 1 tablet by mouth 2 (two) times daily.     hydrochlorothiazide 25 MG tablet  Commonly known as:  HYDRODIURIL  Take 25 mg by mouth every morning.     HYDROcodone-acetaminophen 5-325 MG tablet  Commonly known as:  NORCO/VICODIN  Take 1 tablet by mouth 2 (two) times daily as needed for moderate pain.     losartan 100 MG tablet  Commonly  known as:  COZAAR  Take 50 mg by mouth every evening.     magnesium oxide 400 MG tablet  Commonly known as:  MAG-OX  Take 400 mg by mouth daily.     metoprolol succinate 25 MG 24 hr tablet  Commonly known as:  TOPROL-XL  Take 12.5 mg by mouth every morning.     potassium chloride 10 MEQ tablet  Commonly known as:  K-DUR  Take 20 mEq by mouth 2 (two) times daily.     tamsulosin 0.4 MG Caps capsule  Commonly known as:  FLOMAX  Take 0.4 mg by mouth daily.     VITAMIN D3 SUPER STRENGTH 2000 units Tabs  Generic drug:  Cholecalciferol  Take 2,000 Units by mouth daily.        Please HPI for pertinent positives, otherwise complete 10 system ROS negative.  Mallampati Score: MD Evaluation Airway: WNL Heart: WNL Abdomen: WNL Chest/ Lungs: WNL ASA  Classification: 3 Mallampati/Airway Score: Two  Physical Exam: BP 161/73 mmHg  Pulse 74  Temp(Src) 98.1 F (36.7 C)  Resp 16  Ht _0  (1.727 m)  Wt 211 lb (95.709 kg)  BMI 32.09 kg/m2  SpO2 99% Body mass index is 32.09 kg/(m^2). General: pleasant, WD, WN white male who is laying in bed in NAD HEENT: head is normocephalic, atraumatic.  Sclera are noninjected.  PERRL.  Ears and nose without any masses or lesions.  Mouth is pink and moist Heart: regular, rate, and rhythm.  Normal s1,s2. No obvious murmurs, gallops, or rubs noted.  Palpable radial and pedal pulses bilaterally Lungs: CTAB, no wheezes, rhonchi, or rales noted.  Respiratory effort nonlabored Abd: soft, NT, ND, +BS, no masses, hernias, or organomegaly Psych: A&Ox3 with an appropriate affect.   Labs: Results for orders placed or performed during the hospital encounter of 05/16/16 (from the past 48 hour(s))  APTT     Status: None   Collection Time: 05/16/16  9:54 AM  Result Value Ref Range   aPTT 29 24 - 37 seconds  Basic metabolic panel     Status: Abnormal   Collection Time: 05/16/16  9:54 AM  Result Value Ref Range   Sodium 137 135 - 145 mmol/L   Potassium  3.7 3.5 - 5.1 mmol/L   Chloride 99 (L) 101 - 111 mmol/L   CO2 30 22 - 32 mmol/L   Glucose, Bld 115 (H) 65 - 99 mg/dL   BUN  14 6 - 20 mg/dL   Creatinine, Ser 1.51 (H) 0.61 - 1.24 mg/dL   Calcium 9.1 8.9 - 10.3 mg/dL   GFR calc non Af Amer 40 (L) >60 mL/min   GFR calc Af Amer 47 (L) >60 mL/min    Comment: (NOTE) The eGFR has been calculated using the CKD EPI equation. This calculation has not been validated in all clinical situations. eGFR's persistently <60 mL/min signify possible Chronic Kidney Disease.    Anion gap 8 5 - 15  CBC     Status: None   Collection Time: 05/16/16  9:54 AM  Result Value Ref Range   WBC 9.9 4.0 - 10.5 K/uL   RBC 5.50 4.22 - 5.81 MIL/uL   Hemoglobin 15.6 13.0 - 17.0 g/dL   HCT 48.1 39.0 - 52.0 %   MCV 87.5 78.0 - 100.0 fL   MCH 28.4 26.0 - 34.0 pg   MCHC 32.4 30.0 - 36.0 g/dL   RDW 14.8 11.5 - 15.5 %   Platelets 157 150 - 400 K/uL  Protime-INR     Status: None   Collection Time: 05/16/16  9:54 AM  Result Value Ref Range   Prothrombin Time 14.5 11.6 - 15.2 seconds   INR 1.11 0.00 - 1.49    Imaging: No results found.  Assessment/Plan 1. Headaches, h/o vertebral artery stenosis -labs have been reviewed.  His Cr is 1.5, but this appears to be his baseline.  We will give him a 250cc NS bolus and then run him at 75cc/hr. -will proceed with angiogram to determine if there is any other source for the patient's HAs. -Risks and Benefits discussed with the patient including, but not limited to bleeding, infection, vascular injury or contrast induced renal failure. All of the patient's questions were answered, patient is agreeable to proceed. Consent signed and in chart.  Thank you for this interesting consult.  I greatly enjoyed meeting HANI CAMPUSANO and look forward to participating in their care.  A copy of this report was sent to the requesting provider on this date.  Electronically Signed: Henreitta Cea 05/16/2016, 11:13 AM   I spent a  total of    30 minutes in face to face in clinical consultation, greater than 50% of which was counseling/coordinating care for Headaches, vertebral artery stenosis

## 2016-05-22 ENCOUNTER — Other Ambulatory Visit: Payer: Self-pay | Admitting: Internal Medicine

## 2016-06-03 ENCOUNTER — Telehealth: Payer: Self-pay | Admitting: Neurology

## 2016-06-03 NOTE — Telephone Encounter (Signed)
Dr Ahern/[name]- FYI Called daughter back. Made f/u with MM,NP per daughter request. Ok per Dr [name]. More frequent HA's. Spoke to daughter, Gary Chang. Check in 145pm [facility] 06/03/16. She verbalized understanding.

## 2016-06-03 NOTE — Telephone Encounter (Signed)
Patient daughter reports worsening of HA's that started 05/31/16.  An appointment was made for 06/25/16.She is requesting a sooner appt, Advised that the nurse would call

## 2016-06-04 ENCOUNTER — Ambulatory Visit (INDEPENDENT_AMBULATORY_CARE_PROVIDER_SITE_OTHER): Payer: PPO | Admitting: Adult Health

## 2016-06-04 ENCOUNTER — Encounter: Payer: Self-pay | Admitting: Adult Health

## 2016-06-04 VITALS — BP 134/68 | HR 72 | Resp 18 | Ht 68.0 in | Wt 216.0 lb

## 2016-06-04 DIAGNOSIS — F410 Panic disorder [episodic paroxysmal anxiety] without agoraphobia: Secondary | ICD-10-CM

## 2016-06-04 DIAGNOSIS — F329 Major depressive disorder, single episode, unspecified: Secondary | ICD-10-CM | POA: Diagnosis not present

## 2016-06-04 DIAGNOSIS — G500 Trigeminal neuralgia: Secondary | ICD-10-CM

## 2016-06-04 DIAGNOSIS — F32A Depression, unspecified: Secondary | ICD-10-CM

## 2016-06-04 DIAGNOSIS — R51 Headache: Secondary | ICD-10-CM | POA: Diagnosis not present

## 2016-06-04 DIAGNOSIS — R519 Headache, unspecified: Secondary | ICD-10-CM

## 2016-06-04 NOTE — Patient Instructions (Signed)
Follow-up with Dr. Casimiro Needle If your symptoms worsen or you develop new symptoms please let us know.

## 2016-06-04 NOTE — Progress Notes (Addendum)
PATIENT: Gary Chang DOB: 02/04/1928  REASON FOR VISIT: follow up- trigeminal neuralgia/occpital pain, depression, panic attacks HISTORY FROM: patient  HISTORY OF PRESENT ILLNESS: Gary Chang is a 82 year old male with a history of sharp stabbing pain in the head. He returns today for follow-up. In the past the patient has had nerve blocks. He states that he "thinks" they were beneficial. He reports that he feels that this sharp shooting pain has become less frequent. He states that the pain shoots across the head and only last for several seconds. The daughter however feels that they have not decreased in frequency. She states that it  happened  6 times on Monday. Patient states that he is more concerned with filling depressed. He states that he will start feeling bad and break out into a cold sweat and has a tingling sensation in the hands and arms. He states that this may last 15-20 minutes and then it will pass. He is being referred to Dr. Casimiro Needle but has not yet made an appointment. He returns today for an evaluation.  HISTORY 04/28/16 Gary Chang): Gary Chang is a 82 y.o. male here as a referral from Dr. Estanislado Pandy for Headache. He is accompanied by his wife and daughter who provide much information. He has a PMHx of T2DM, HTN, HLD, Anxiety and panic attacks, COPD, depression, Lumbar laminectomy/decompression microdiscectomy, AAA s/p repair, peripheral neuropathy, degenerative cervical spine disease s/p surgery, gait difficulty, dizziness, chronic headaches for many years, memory complaints, Headache started more than 2-3 years ago with lightning bolts across the top of the head. The pain starts in the right temple and travels across the top of the head in an arc to the left temple area. He also has a more "regular" tension-type headache. The "regular" headache is on the top of the head, pain, not throbbing or pounding. It just hurts, he can't describe it. Maybe pressure. No inciting  event to the headache, no history of head trauma, illness, surgery or any preceding event to the start of the headaches. He has regular tension headaches 10/30 days a month that last a few hours but not severe and easy treated. The shooting across the top of the head is every day and multiple times a day. He denies any occipital pain. The shooting pain starts on the right temple and arches over to the left temple. Doesn't happen when asleep. He does not wake up with headaches. The regular headache is worse when watching TV. 2 weeks ago he had a panic attack, he was on his right side and he has been having worsening panic attacks, he has anxiety, panic, breaks out in a sweat, anxious, he used to have anxiety attacks in the past and claustrophobia. He has depression and anxiety. The panic attacks are worsening. The tension headache lasts a few hours sometimes and they go away easily with oxycodone. No double vision, no problems with chewing, no fevers, no new arthralgias or myalgias. The shooting pain headaches are paroxysmal, brief, no triggers, no lacrimation, no rhinorrhea, no eye injection. He has tried multiple medications and lidoderm patches without relief, he has another neurologist who he has been seeing for at least several years for these headaches. No changes in frequency or severity of headaches in several years. Today patient is not so much concerned with the headaches as with the panic attacks, he says if he could get rid of the panic attacks then the headaches wouldn't bother him as much. He is on multiple  medications for mood problems per his family and they want help with the panic attacks. No light sensitivity or nausea or vomiting or other migrainous features.   Current and previous medications he has tried that are also used in headache management(per review of records): Topamax, Lidoderm patches, neurontin, celexa, flexeril, metoprolol, prednisone, topamax, zofran, lyrica  Reviewed notes,  labs and imaging from outside physicians, which showed:   MRI of the brain 02/28/2016: IMPRESSION: 1. No acute intracranial abnormality. 2. Mild chronic small vessel ischemic disease and moderate cerebral atrophy. 3. Suspected occlusion (or severely diminished flow) of the distal right vertebral artery. 4. Severe mid basilar artery stenosis. 5. Anterior circulation branch vessel atherosclerosis without evidence of significant proximal stenosis.  IR Angiogram 02/2016:: Angiographically occluded right vertebral artery at its origin with partial reconstitution at the level of C2 from the ascending cervical branch of the thyrocervical trunk to the right vertebrobasilar junction where it terminates primarily in the ipsilateral posterior inferior cerebellar artery.  Approximately 30% narrowing of the left vertebral artery at its origin. Slow flow noted to the level of the cranial skull base where there is reconstitution of the vessel from the ascending cervical branch of the left thyrocervical trunk, with associated approximately 60% stenoses at the point of entry of the ascending cervical branch into the left vertebral artery.  Severe focal stenosis of the mid basilar artery  REVIEW OF SYSTEMS: Out of a complete 14 system review of symptoms, the patient complains only of the following symptoms, and all other reviewed systems are negative.  See history of present illness  ALLERGIES: Allergies  Allergen Reactions  . Ezetimibe Other (See Comments)  . Statins Other (See Comments)    "Numbness and tingling"    HOME MEDICATIONS: Outpatient Prescriptions Prior to Visit  Medication Sig Dispense Refill  . ALPRAZolam (XANAX) 0.25 MG tablet Take 1 tablet (0.25 mg total) by mouth 2 (two) times daily as needed. (Patient taking differently: Take 0.25 mg by mouth daily as needed for anxiety. ) 45 tablet 2  . amLODipine (NORVASC) 10 MG tablet Take 10 mg by mouth every morning.     Marland Kitchen aspirin EC 81 MG  tablet Take 81 mg by mouth at bedtime.      Marland Kitchen b complex vitamins tablet Take 1 tablet by mouth every morning.     Marland Kitchen buPROPion (WELLBUTRIN XL) 300 MG 24 hr tablet Take 300 mg by mouth every morning.     . Cholecalciferol (VITAMIN D3 SUPER STRENGTH) 2000 UNITS TABS Take 2,000 Units by mouth daily.      . citalopram (CELEXA) 20 MG tablet Take 20 mg by mouth at bedtime.     . clopidogrel (PLAVIX) 75 MG tablet Take 1 tablet (75 mg total) by mouth daily. (Patient taking differently: Take 75 mg by mouth every evening. ) 30 tablet 1  . donepezil (ARICEPT) 5 MG tablet Take 5 mg by mouth every morning.    Marland Kitchen glipiZIDE (GLUCOTROL XL) 2.5 MG 24 hr tablet Take 2.5 mg by mouth at bedtime.      . Glucos-Chondroit-Hyaluron-MSM (GLUCOSAMINE CHONDROITIN JOINT) TABS Take 2,000 mg by mouth daily.    . hydrALAZINE (APRESOLINE) 100 MG tablet Take 1 tablet by mouth 2 (two) times daily.    . hydrochlorothiazide (HYDRODIURIL) 25 MG tablet Take 25 mg by mouth every morning.     Marland Kitchen HYDROcodone-acetaminophen (NORCO/VICODIN) 5-325 MG per tablet Take 1 tablet by mouth 2 (two) times daily as needed for moderate pain.     Marland Kitchen  losartan (COZAAR) 100 MG tablet Take 50 mg by mouth every evening.     . magnesium oxide (MAG-OX) 400 MG tablet Take 400 mg by mouth daily.    . metoprolol succinate (TOPROL-XL) 25 MG 24 hr tablet Take 12.5 mg by mouth every morning.     . potassium chloride (K-DUR) 10 MEQ tablet Take 20 mEq by mouth 2 (two) times daily.    . tamsulosin (FLOMAX) 0.4 MG CAPS capsule Take 0.4 mg by mouth daily.      No facility-administered medications prior to visit.    PAST MEDICAL HISTORY: Past Medical History  Diagnosis Date  . High cholesterol   . Diabetes mellitus     tingling in feet  . Arthritis   . GERD (gastroesophageal reflux disease)   . H/O hiatal hernia   . Enlarged prostate     laser surgery  . Hypertension     pcp  dr Jeneen Rinks [place]  [place]  . Cancer ([place])     skin ca removed  . Anxiety   .  Allergic state   . Abdominal aortic aneurysm (AAA) ([place])   . COPD (chronic obstructive pulmonary disease) ([place])   . Depression   . Kidney stones   . Melanoma ([place])   . Renal artery aneurysm ([place])     PAST SURGICAL HISTORY: Past Surgical History  Procedure Laterality Date  . Hernia repair    . Kidney stone surgery    . Abdominal aortic aneurysm repair    . Back surgery      lumbar  ,cervical surgeries  . Lumbar laminectomy/decompression microdiscectomy  12/10/2011    Procedure: LUMBAR LAMINECTOMY/DECOMPRESSION MICRODISCECTOMY;  Surgeon: Ophelia Charter;  Location: [facility]UR[facility];  Service: Neurosurgery;;  Thoracic One-Two Laminectomy  . Esophagogastroduodenoscopy (egd) with propofol N/A 02/11/2016    Procedure: ESOPHAGOGASTRODUODENOSCOPY (EGD) WITH PROPOFOL;  Surgeon: Hulen Luster, MD;  [facility] ENDOSCOPY;  Service: Gastroenterology;  Laterality: N/A;    FAMILY HISTORY: Family History  Problem Relation Age of Onset  . Anesthesia problems Neg Hx   . Hypotension Neg Hx   . Malignant hyperthermia Neg Hx   . Pseudochol deficiency Neg Hx   . Heart Problems Mother   . Tuberculosis Father     SOCIAL HISTORY: Social History   Social History  . Marital Status: Married    Spouse Name: [name]  . Number of Children: 2  . Years of Education: HS   Occupational History  . Retired    Social History Main Topics  . Smoking status: Former Smoker    Types: Pipe  . Smokeless tobacco: Never Used  . Alcohol Use: Yes     Comment: occ 1-2 beers a month  . Drug Use: No  . Sexual Activity: Not on file   Other Topics Concern  . Not on file   Social History Narrative   Patient lives at home with his spouse.   Caffeine Use: 1-2 cups daily      PHYSICAL EXAM  Filed Vitals:   06/04/16 1406  BP: 134/68  Pulse: 72  Resp: 18  Height: 5\' 8"  (1.727 m)  Weight: 216 lb (97.977 kg)   Body mass index is 32.85 kg/(m^2).  Generalized: Well developed, in no acute distress    Neurological examination  Mentation: Alert oriented to time, place, history taking. Follows all commands speech and language fluent Cranial nerve II-XII: Pupils were equal round reactive to light. Extraocular movements were full, visual field were full on confrontational test.  Facial sensation and strength were normal. Uvula tongue midline. Head turning and shoulder shrug  were normal and symmetric. Motor: The motor testing reveals 5 over 5 strength of all 4 extremities. Good symmetric motor tone is noted throughout.  Sensory: Sensory testing is intact to soft touch on all 4 extremities. No evidence of extinction is noted.  Coordination: Cerebellar testing reveals good finger-nose-finger and heel-to-shin bilaterally.  Gait and station: Gait is normal.  Reflexes: Deep tendon reflexes are symmetric and normal bilaterally.   DIAGNOSTIC DATA (LABS, IMAGING, TESTING) - I reviewed patient records, labs, notes, testing and imaging myself where available.  Lab Results  Component Value Date   WBC 9.9 05/16/2016   HGB 15.6 05/16/2016   HCT 48.1 05/16/2016   MCV 87.5 05/16/2016   PLT 157 05/16/2016      Component Value Date/Time   NA 137 05/16/2016 0954   NA 132* 02/09/2014 1814   K 3.7 05/16/2016 0954   K 2.8* 02/09/2014 1814   CL 99* 05/16/2016 0954   CL 97* 02/09/2014 1814   CO2 30 05/16/2016 0954   CO2 26 02/09/2014 1814   GLUCOSE 115* 05/16/2016 0954   GLUCOSE 150* 02/09/2014 1814   BUN 14 05/16/2016 0954   BUN 18 02/09/2014 1814   CREATININE 1.51* 05/16/2016 0954   CREATININE 1.63* 02/09/2014 1814   CALCIUM 9.1 05/16/2016 0954   CALCIUM 8.8 02/09/2014 1814   PROT 6.2* 02/28/2016 1015   PROT 7.0 02/09/2014 1814   PROT 6.8 11/29/2013 1456   ALBUMIN 3.8 02/28/2016 1015   ALBUMIN 3.6 02/09/2014 1814   AST 19 02/28/2016 1015   AST 16 02/09/2014 1814   ALT 18 02/28/2016 1015   ALT 25 02/09/2014 1814   ALKPHOS 83 02/28/2016 1015   ALKPHOS 94 02/09/2014 1814   BILITOT 0.6  02/28/2016 1015   BILITOT 0.9 02/09/2014 1814   GFRNONAA 40* 05/16/2016 0954   GFRNONAA 38* 02/09/2014 1814   GFRNONAA 57* 01/23/2012 1256   GFRAA 47* 05/16/2016 0954   GFRAA 44* 02/09/2014 1814   GFRAA >60 01/23/2012 1256    Lab Results  Component Value Date   HGBA1C 6.4* 03/01/2016   Lab Results  Component Value Date   VITAMINB12 686 02/29/2016   Lab Results  Component Value Date   TSH 1.002 02/29/2016      ASSESSMENT AND PLAN 82 y.o. year old male  has a past medical history of High cholesterol; Diabetes mellitus; Arthritis; GERD (gastroesophageal reflux disease); H/O hiatal hernia; Enlarged prostate; Hypertension; Cancer ([place]); Anxiety; Allergic state; Abdominal aortic aneurysm (AAA) ([place]); COPD (chronic obstructive pulmonary disease) ([place]); Depression; Kidney stones; Melanoma ([place]); and Renal artery aneurysm ([place]). here with:   1. Sharp shooting discomfort in the head  The patient continues to have sharp shooting pains in the head. Not sure if this was related to trigeminal or occipital neuralgia? The patient has been on multiple medications in the past with no benefit. He had an MRI of the head and of the trigeminal nerve which was normal. I recommended trying carbamazepine however the patient feels that these pains are tolerable. He is more concerned with his depression and panic attacks. I have instructed the patient that he should follow-up with Dr. Casimiro Needle. He verbalized understanding. He will follow-up with Dr. Jaynee Chang in 3 months.    Ward Givens, MSN, NP-C 06/04/2016, 2:07 PM [facility] [address], [place], [place] [address] [phone]   Personally reviewed plan as stated above and agree.  I have personally reviewed the history, evaluated lab date, reviewed imaging studies and agree with radiology interpretations.

## 2016-06-25 ENCOUNTER — Ambulatory Visit: Payer: PPO | Admitting: Neurology

## 2016-07-02 ENCOUNTER — Telehealth: Payer: Self-pay | Admitting: Neurology

## 2016-07-02 DIAGNOSIS — I70213 Atherosclerosis of native arteries of extremities with intermittent claudication, bilateral legs: Secondary | ICD-10-CM | POA: Diagnosis not present

## 2016-07-02 DIAGNOSIS — E119 Type 2 diabetes mellitus without complications: Secondary | ICD-10-CM | POA: Diagnosis not present

## 2016-07-02 DIAGNOSIS — I714 Abdominal aortic aneurysm, without rupture: Secondary | ICD-10-CM | POA: Diagnosis not present

## 2016-07-02 DIAGNOSIS — I10 Essential (primary) hypertension: Secondary | ICD-10-CM | POA: Diagnosis not present

## 2016-07-02 DIAGNOSIS — I700 Atherosclerosis of aorta: Secondary | ICD-10-CM | POA: Diagnosis not present

## 2016-07-02 DIAGNOSIS — I739 Peripheral vascular disease, unspecified: Secondary | ICD-10-CM | POA: Diagnosis not present

## 2016-07-02 DIAGNOSIS — E785 Hyperlipidemia, unspecified: Secondary | ICD-10-CM | POA: Diagnosis not present

## 2016-07-02 NOTE — Telephone Encounter (Signed)
Patient called to request appointment with Dr. [name], appointment currently scheduled for September 18th, next availability is August 23rd. Patient states headaches are more severe, shooting pain, headaches more constant 8 or 10 times a day, lasting longer. Appointment scheduled with NP, Jinny Blossom for tomorrow July 13th 2:30pm.

## 2016-07-02 NOTE — Telephone Encounter (Signed)
Gary Chang, refer patient to a specialty headache clinic. I am not sure we can help him. Discuss with them, I reviewed his records and I am unsure we have anything more to offer them. There is a headache specialist in [place] [facility] Dr[facility] Claudie Fisherman. They need a specialized headache clinic. thanks

## 2016-07-02 NOTE — Telephone Encounter (Signed)
Dr Ahern/megan- Juluis Rainier

## 2016-07-03 ENCOUNTER — Ambulatory Visit (INDEPENDENT_AMBULATORY_CARE_PROVIDER_SITE_OTHER): Payer: PPO | Admitting: Adult Health

## 2016-07-03 ENCOUNTER — Encounter: Payer: Self-pay | Admitting: Adult Health

## 2016-07-03 VITALS — BP 141/76 | HR 85 | Ht 68.0 in | Wt 217.0 lb

## 2016-07-03 DIAGNOSIS — R51 Headache: Secondary | ICD-10-CM

## 2016-07-03 DIAGNOSIS — R519 Headache, unspecified: Secondary | ICD-10-CM

## 2016-07-03 NOTE — Progress Notes (Addendum)
PATIENT: Gary Chang DOB: 1928-06-01  REASON FOR VISIT: follow up- headache HISTORY FROM: patient  HISTORY OF PRESENT ILLNESS:   Mr. Gary Chang is an 82 year old male with a history of headaches. He returns today for follow-up. The patient states that he is now having daily headaches. He continues to have the sharp shooting pains in the top of the head that typically radiates from from the right temporal region to the left. He states that those pains do not bother him. He is most concerned about the daily headache. He states that he is on hydrocodone. He typically takes half a tablet in the morning and in the evening. He is taking this medication for his headaches. He denies photophobia, phonophobia, nausea and vomiting. He also feels that his balance has gotten slightly worse. He uses a cane when ambulating. Denies any weakness in the upper or lower extremity. He returns today for an evaluation.  I received the patient's medical history from [name]- it appears that the patient was complaining of the same type of headache. Occipital nerve block was offered the patient also had an MRI of the and an EEG.  EEG on 03/02/2015 showed minimal abnormal EEG due to the presence of intermittent generalized theta slowing of the background  Dr. Manuella Ghazi also tried the patient on gabapentin, Lidoderm patches. She also offered injection with lidocaine. Gabapentin was not beneficial nor Lidoderm patches for the patient. He never received any injections with Dr. Manuella Ghazi  HISTORY  06/04/16: Mr. Gary Chang is a 82 year old male with a history of sharp stabbing pain in the head. He returns today for follow-up. In the past the patient has had nerve blocks. He states that he "thinks" they were beneficial. He reports that he feels that this sharp shooting pain has become less frequent. He states that the pain shoots across the head and only last for several seconds. The daughter however feels that they have not  decreased in frequency. She states that it happened 6 times on Monday. Patient states that he is more concerned with filling depressed. He states that he will start feeling bad and break out into a cold sweat and has a tingling sensation in the hands and arms. He states that this may last 15-20 minutes and then it will pass. He is being referred to Dr. Casimiro Needle but has not yet made an appointment. He returns today for an evaluation.  HISTORY 04/28/16 Gary Chang): [name] is a 82 y.o. male here as a referral from Dr. Estanislado Pandy for Headache. He is accompanied by his wife and daughter who provide much information. He has a PMHx of T2DM, HTN, HLD, Anxiety and panic attacks, COPD, depression, Lumbar laminectomy/decompression microdiscectomy, AAA s/p repair, peripheral neuropathy, degenerative cervical spine disease s/p surgery, gait difficulty, dizziness, chronic headaches for many years, memory complaints, Headache started more than 2-3 years ago with lightning bolts across the top of the head. The pain starts in the right temple and travels across the top of the head in an arc to the left temple area. He also has a more "regular" tension-type headache. The "regular" headache is on the top of the head, pain, not throbbing or pounding. It just hurts, he can't describe it. Maybe pressure. No inciting event to the headache, no history of head trauma, illness, surgery or any preceding event to the start of the headaches. He has regular tension headaches 10/30 days a month that last a few hours but not severe and easy treated. The shooting  across the top of the head is every day and multiple times a day. He denies any occipital pain. The shooting pain starts on the right temple and arches over to the left temple. Doesn't happen when asleep. He does not wake up with headaches. The regular headache is worse when watching TV. 2 weeks ago he had a panic attack, he was on his right side and he has been having  worsening panic attacks, he has anxiety, panic, breaks out in a sweat, anxious, he used to have anxiety attacks in the past and claustrophobia. He has depression and anxiety. The panic attacks are worsening. The tension headache lasts a few hours sometimes and they go away easily with oxycodone. No double vision, no problems with chewing, no fevers, no new arthralgias or myalgias. The shooting pain headaches are paroxysmal, brief, no triggers, no lacrimation, no rhinorrhea, no eye injection. He has tried multiple medications and lidoderm patches without relief, he has another neurologist who he has been seeing for at least several years for these headaches. No changes in frequency or severity of headaches in several years. Today patient is not so much concerned with the headaches as with the panic attacks, he says if he could get rid of the panic attacks then the headaches wouldn't bother him as much. He is on multiple medications for mood problems per his family and they want help with the panic attacks. No light sensitivity or nausea or vomiting or other migrainous features.   Current and previous medications he has tried that are also used in headache management(per review of records): Topamax, Lidoderm patches, neurontin, celexa, flexeril, metoprolol, prednisone, topamax, zofran, lyrica  Reviewed notes, labs and imaging from outside physicians, which showed:   MRI of the brain 02/28/2016: IMPRESSION: 1. No acute intracranial abnormality. 2. Mild chronic small vessel ischemic disease and moderate cerebral atrophy. 3. Suspected occlusion (or severely diminished flow) of the distal right vertebral artery. 4. Severe mid basilar artery stenosis. 5. Anterior circulation branch vessel atherosclerosis without evidence of significant proximal stenosis.  IR Angiogram 02/2016:: Angiographically occluded right vertebral artery at its origin with partial reconstitution at the level of C2 from the ascending  cervical branch of the thyrocervical trunk to the right vertebrobasilar junction where it terminates primarily in the ipsilateral posterior inferior cerebellar artery.  Approximately 30% narrowing of the left vertebral artery at its origin. Slow flow noted to the level of the cranial skull base where there is reconstitution of the vessel from the ascending cervical branch of the left thyrocervical trunk, with associated approximately 60% stenoses at the point of entry of the ascending cervical branch into the left vertebral artery.  Severe focal stenosis of the mid basilar artery  REVIEW OF SYSTEMS: Out of a complete 14 system review of symptoms, the patient complains only of the following symptoms, and all other reviewed systems are negative.  Memory loss, dizziness, headache, tremors, confusion, depression, neck stiffness, frequent waking, hearing loss  ALLERGIES: Allergies  Allergen Reactions  . Ezetimibe Other (See Comments)  . Statins Other (See Comments)    "Numbness and tingling"    HOME MEDICATIONS: Outpatient Prescriptions Prior to Visit  Medication Sig Dispense Refill  . ALPRAZolam (XANAX) 0.25 MG tablet Take 1 tablet (0.25 mg total) by mouth 2 (two) times daily as needed. (Patient taking differently: Take 0.25 mg by mouth daily as needed for anxiety. ) 45 tablet 2  . amLODipine (NORVASC) 10 MG tablet Take 10 mg by mouth every morning.     Marland Kitchen  aspirin EC 81 MG tablet Take 81 mg by mouth at bedtime.      Marland Kitchen b complex vitamins tablet Take 1 tablet by mouth every morning.     Marland Kitchen buPROPion (WELLBUTRIN XL) 300 MG 24 hr tablet Take 300 mg by mouth every morning.     . Cholecalciferol (VITAMIN D3 SUPER STRENGTH) 2000 UNITS TABS Take 2,000 Units by mouth daily.      . citalopram (CELEXA) 20 MG tablet Take 20 mg by mouth at bedtime.     . clopidogrel (PLAVIX) 75 MG tablet Take 1 tablet (75 mg total) by mouth daily. (Patient taking differently: Take 75 mg by mouth every evening. ) 30  tablet 1  . donepezil (ARICEPT) 5 MG tablet Take 5 mg by mouth every morning.    Marland Kitchen glipiZIDE (GLUCOTROL XL) 2.5 MG 24 hr tablet Take 2.5 mg by mouth at bedtime.      . Glucos-Chondroit-Hyaluron-MSM (GLUCOSAMINE CHONDROITIN JOINT) TABS Take 2,000 mg by mouth daily.    . hydrALAZINE (APRESOLINE) 100 MG tablet Take 1 tablet by mouth 2 (two) times daily.    . hydrochlorothiazide (HYDRODIURIL) 25 MG tablet Take 25 mg by mouth every morning.     Marland Kitchen HYDROcodone-acetaminophen (NORCO/VICODIN) 5-325 MG per tablet Take 1 tablet by mouth 2 (two) times daily as needed for moderate pain.     Marland Kitchen losartan (COZAAR) 100 MG tablet Take 50 mg by mouth every evening.     . magnesium oxide (MAG-OX) 400 MG tablet Take 400 mg by mouth daily.    . metoprolol succinate (TOPROL-XL) 25 MG 24 hr tablet Take 12.5 mg by mouth every morning.     . potassium chloride (K-DUR) 10 MEQ tablet Take 20 mEq by mouth 2 (two) times daily.    . tamsulosin (FLOMAX) 0.4 MG CAPS capsule Take 0.4 mg by mouth daily.      No facility-administered medications prior to visit.    PAST MEDICAL HISTORY: Past Medical History  Diagnosis Date  . High cholesterol   . Diabetes mellitus     tingling in feet  . Arthritis   . GERD (gastroesophageal reflux disease)   . H/O hiatal hernia   . Enlarged prostate     laser surgery  . Hypertension     pcp  dr Jeneen Rinks [place]  [place]  . Cancer ([place])     skin ca removed  . Anxiety   . Allergic state   . Abdominal aortic aneurysm (AAA) ([place])   . COPD (chronic obstructive pulmonary disease) ([place])   . Depression   . Kidney stones   . Melanoma ([place])   . Renal artery aneurysm ([place])     PAST SURGICAL HISTORY: Past Surgical History  Procedure Laterality Date  . Hernia repair    . Kidney stone surgery    . Abdominal aortic aneurysm repair    . Back surgery      lumbar  ,cervical surgeries  . Lumbar laminectomy/decompression microdiscectomy  12/10/2011    Procedure: LUMBAR  LAMINECTOMY/DECOMPRESSION MICRODISCECTOMY;  Surgeon: Ophelia Charter;  Location: [facility];  Service: Neurosurgery;;  Thoracic One-Two Laminectomy  . Esophagogastroduodenoscopy (egd) with propofol N/A 02/11/2016    Procedure: ESOPHAGOGASTRODUODENOSCOPY (EGD) WITH PROPOFOL;  Surgeon: Hulen Luster, MD;  Location: [facility] ENDOSCOPY;  Service: Gastroenterology;  Laterality: N/A;    FAMILY HISTORY: Family History  Problem Relation Age of Onset  . Anesthesia problems Neg Hx   . Hypotension Neg Hx   . Malignant hyperthermia Neg Hx   .  Pseudochol deficiency Neg Hx   . Heart Problems Mother   . Tuberculosis Father     SOCIAL HISTORY: Social History   Social History  . Marital Status: Married    Spouse Name: [name]  . Number of Children: 2  . Years of Education: HS   Occupational History  . Retired    Social History Main Topics  . Smoking status: Former Smoker    Types: Pipe  . Smokeless tobacco: Never Used  . Alcohol Use: Yes     Comment: occ 1-2 beers a month  . Drug Use: No  . Sexual Activity: Not on file   Other Topics Concern  . Not on file   Social History Narrative   Patient lives at home with his spouse.   Caffeine Use: 1-2 cups daily      PHYSICAL EXAM  Filed Vitals:   07/03/16 1436  BP: 141/76  Pulse: 85  Height: 5\' 8"  (1.727 m)  Weight: 217 lb (98.431 kg)   Body mass index is 33 kg/(m^2).  Generalized: Well developed, in no acute distress   Neurological examination  Mentation: Alert oriented to time, place, history taking. Follows all commands speech and language fluent Cranial nerve II-XII: Pupils were equal round reactive to light. Extraocular movements were full, visual Gary were full on confrontational test. Facial sensation and strength were normal. Uvula tongue midline. Head turning and shoulder shrug  were normal and symmetric. Motor: The motor testing reveals 5 over 5 strength of all 4 extremities. Good symmetric motor tone is noted throughout.    Sensory: Sensory testing is intact to soft touch on all 4 extremities. No evidence of extinction is noted.  Coordination: Cerebellar testing reveals good finger-nose-finger and heel-to-shin bilaterally.  Gait and station: Patient uses a cane when ambulating. Tandem gait not attempted. Romberg is negative. Reflexes: Deep tendon reflexes are symmetric and normal bilaterally.   DIAGNOSTIC DATA (LABS, IMAGING, TESTING) - I reviewed patient records, labs, notes, testing and imaging myself where available.  Lab Results  Component Value Date   WBC 9.9 05/16/2016   HGB 15.6 05/16/2016   HCT 48.1 05/16/2016   MCV 87.5 05/16/2016   PLT 157 05/16/2016      Component Value Date/Time   NA 137 05/16/2016 0954   NA 132* 02/09/2014 1814   K 3.7 05/16/2016 0954   K 2.8* 02/09/2014 1814   CL 99* 05/16/2016 0954   CL 97* 02/09/2014 1814   CO2 30 05/16/2016 0954   CO2 26 02/09/2014 1814   GLUCOSE 115* 05/16/2016 0954   GLUCOSE 150* 02/09/2014 1814   BUN 14 05/16/2016 0954   BUN 18 02/09/2014 1814   CREATININE 1.51* 05/16/2016 0954   CREATININE 1.63* 02/09/2014 1814   CALCIUM 9.1 05/16/2016 0954   CALCIUM 8.8 02/09/2014 1814   PROT 6.2* 02/28/2016 1015   PROT 7.0 02/09/2014 1814   PROT 6.8 11/29/2013 1456   ALBUMIN 3.8 02/28/2016 1015   ALBUMIN 3.6 02/09/2014 1814   AST 19 02/28/2016 1015   AST 16 02/09/2014 1814   ALT 18 02/28/2016 1015   ALT 25 02/09/2014 1814   ALKPHOS 83 02/28/2016 1015   ALKPHOS 94 02/09/2014 1814   BILITOT 0.6 02/28/2016 1015   BILITOT 0.9 02/09/2014 1814   GFRNONAA 40* 05/16/2016 0954   GFRNONAA 38* 02/09/2014 1814   GFRNONAA 57* 01/23/2012 1256   GFRAA 47* 05/16/2016 0954   GFRAA 44* 02/09/2014 1814   GFRAA >60 01/23/2012 1256    Lab Results  Component Value Date   HGBA1C 6.4* 03/01/2016   Lab Results  Component Value Date   [id number] 02/29/2016   Lab Results  Component Value Date   TSH 1.002 02/29/2016      ASSESSMENT AND PLAN 82 y.o.  year old male  has a past medical history of High cholesterol; Diabetes mellitus; Arthritis; GERD (gastroesophageal reflux disease); H/O hiatal hernia; Enlarged prostate; Hypertension; Cancer ([place]); Anxiety; Allergic state; Abdominal aortic aneurysm (AAA) ([place]); COPD (chronic obstructive pulmonary disease) ([place]); Depression; Kidney stones; Melanoma ([place]); and Renal artery aneurysm ([place]). here with:  1. Headache  I consulted with Dr. Lavell Anchors. The patient has tried multiple treatments for his headaches. He has been advised that taking hydrocodone daily Can cause rebound headaches. He should consult with his primary care about weaning off this medication. Patient verbalized understanding. We also feel that due to the complexity of his headaches he should be seen by a headache specialist. He will be referred to [facility] headache clinic to see Dr. Sima Matas Patient is amenable to this plan. He will follow up with our office if needed.   Ward Givens, MSN, NP-C 07/03/2016, 2:36 PM [facility] [address] [place], [place] [address] [phone]    Personally participated in and made any corrections needed to history, physical, neuro exam,assessment and plan as stated above.  I have personally obtained the history, evaluated lab date, reviewed imaging studies and agree with radiology interpretations.    Sarina Ill, MD [facility]

## 2016-07-03 NOTE — Patient Instructions (Signed)
Consider weaning off hydrocodone- can cause rebound headache Referral to headache clinic- Dr. Sima Matas If your symptoms worsen or you develop new symptoms please let us know.

## 2016-07-08 DIAGNOSIS — N183 Chronic kidney disease, stage 3 (moderate): Secondary | ICD-10-CM | POA: Diagnosis not present

## 2016-07-08 DIAGNOSIS — D472 Monoclonal gammopathy: Secondary | ICD-10-CM | POA: Diagnosis not present

## 2016-07-08 DIAGNOSIS — I10 Essential (primary) hypertension: Secondary | ICD-10-CM | POA: Diagnosis not present

## 2016-07-08 DIAGNOSIS — N2581 Secondary hyperparathyroidism of renal origin: Secondary | ICD-10-CM | POA: Diagnosis not present

## 2016-07-14 DIAGNOSIS — F4329 Adjustment disorder with other symptoms: Secondary | ICD-10-CM | POA: Diagnosis not present

## 2016-07-16 DIAGNOSIS — L821 Other seborrheic keratosis: Secondary | ICD-10-CM | POA: Diagnosis not present

## 2016-07-16 DIAGNOSIS — L578 Other skin changes due to chronic exposure to nonionizing radiation: Secondary | ICD-10-CM | POA: Diagnosis not present

## 2016-07-16 DIAGNOSIS — Z85820 Personal history of malignant melanoma of skin: Secondary | ICD-10-CM | POA: Diagnosis not present

## 2016-07-16 DIAGNOSIS — L820 Inflamed seborrheic keratosis: Secondary | ICD-10-CM | POA: Diagnosis not present

## 2016-07-16 DIAGNOSIS — D229 Melanocytic nevi, unspecified: Secondary | ICD-10-CM | POA: Diagnosis not present

## 2016-07-16 DIAGNOSIS — D1800 Hemangioma unspecified site: Secondary | ICD-10-CM | POA: Diagnosis not present

## 2016-07-16 DIAGNOSIS — L570 Actinic keratosis: Secondary | ICD-10-CM | POA: Diagnosis not present

## 2016-07-16 DIAGNOSIS — Z85828 Personal history of other malignant neoplasm of skin: Secondary | ICD-10-CM | POA: Diagnosis not present

## 2016-07-16 DIAGNOSIS — L812 Freckles: Secondary | ICD-10-CM | POA: Diagnosis not present

## 2016-07-16 DIAGNOSIS — D485 Neoplasm of uncertain behavior of skin: Secondary | ICD-10-CM | POA: Diagnosis not present

## 2016-07-28 DIAGNOSIS — E119 Type 2 diabetes mellitus without complications: Secondary | ICD-10-CM | POA: Diagnosis not present

## 2016-08-01 NOTE — Telephone Encounter (Signed)
[name], let's discuss this next week.

## 2016-08-01 NOTE — Telephone Encounter (Signed)
[facility] called to advise that the pt c/a his appt yesterday with Sharlet Salina at [facility]. Pt said he wasn't feeling well and could not make the drive. FYI

## 2016-08-04 ENCOUNTER — Telehealth: Payer: Self-pay | Admitting: Adult Health

## 2016-08-04 DIAGNOSIS — R2689 Other abnormalities of gait and mobility: Secondary | ICD-10-CM | POA: Diagnosis not present

## 2016-08-04 DIAGNOSIS — R51 Headache: Secondary | ICD-10-CM | POA: Diagnosis not present

## 2016-08-04 DIAGNOSIS — I10 Essential (primary) hypertension: Secondary | ICD-10-CM | POA: Diagnosis not present

## 2016-08-04 NOTE — Telephone Encounter (Signed)
Tried calling daughter back. Went to VM but unable to LVM. Per Dr [name], she needs to contact  Dr. Sima Matas in [place] [facility]. [phone] fax [phone]  They need to schedule appt there. Dr [name] referred pt back on 07/03/16 but pt never f/u with their clinic. This is what DR [name] recommends for next steps.

## 2016-08-04 NOTE — Telephone Encounter (Signed)
Daughter Ladona Ridgel called to request appointment with Dr. [name] for ongoing re-occurring shooting pains in head, was seen by PCP this morning and PCP has advised to see his neurologist. Offered sooner appointment with NP, Jinny Blossom on August 23rd, daughter declined, prefers to see Dr. [name]. Scheduled next available with Dr. [name] Wednesday October 4th.

## 2016-08-06 NOTE — Telephone Encounter (Addendum)
Tried calling daughter back. Tried leaving VM.  Please schedule. I can open slot if needed. Ok to offer either 08/12/16 at 10am or 08/26/16 at [place] per Dr [name]

## 2016-08-06 NOTE — Telephone Encounter (Signed)
You can give [name] my next available follow up thanks

## 2016-08-06 NOTE — Telephone Encounter (Signed)
Gary Chang- please advise. Pt most recently saw MM, NP on 07/03/16 and this is when you recommended referring the headache clinic in [place].   Called daughter, Gary Chang back. Advised Gary Chang would like them to f/u at headache clinic in [place]. Pt daughter understands but states "He cannot travel all the way there in his condition". She states his PCP recommended he f/u with Gary Chang. He got a copy of Gary Chang last OV note on 06/04/16 and saw that she suggested he start a medication for the shooting pain, carbamazepine. At the time pt had declined stating the pains were tolerable. Wife stated that she does not agree, that they are not tolerable for him. She is requesting to have an earlier f/u with Gary Chang to discuss trying this medication/other management options. Advised Gary Chang is with patients right now and I will give her the message. I will call back tomorrow at the latest to advise. She verbalized understanding.

## 2016-08-07 NOTE — Telephone Encounter (Signed)
Dr [name]- [facility] [place]  Called daughter again and her son answered. He gave me her cell number to call [phone]. I called this number and spoke to [place]. Made f.u on 08/26/16 at 10am, check in 945am. She verbalized understanding and appreciation. Cx appt in October that was made previously.

## 2016-08-12 DIAGNOSIS — R410 Disorientation, unspecified: Secondary | ICD-10-CM | POA: Diagnosis not present

## 2016-08-18 DIAGNOSIS — R0602 Shortness of breath: Secondary | ICD-10-CM | POA: Diagnosis not present

## 2016-08-18 DIAGNOSIS — I10 Essential (primary) hypertension: Secondary | ICD-10-CM | POA: Diagnosis not present

## 2016-08-18 DIAGNOSIS — E782 Mixed hyperlipidemia: Secondary | ICD-10-CM | POA: Diagnosis not present

## 2016-08-18 DIAGNOSIS — R2681 Unsteadiness on feet: Secondary | ICD-10-CM | POA: Diagnosis not present

## 2016-08-26 ENCOUNTER — Ambulatory Visit: Payer: Self-pay | Admitting: Neurology

## 2016-08-26 ENCOUNTER — Telehealth: Payer: Self-pay | Admitting: Neurology

## 2016-08-26 DIAGNOSIS — C4441 Basal cell carcinoma of skin of scalp and neck: Secondary | ICD-10-CM | POA: Diagnosis not present

## 2016-08-26 NOTE — Telephone Encounter (Signed)
Patient was 11 minutes late for apt. Did resch but has questions about a new medication to be prescribed. Please call daughter at [phone]

## 2016-08-26 NOTE — Telephone Encounter (Signed)
Dr [name]- FYI  Called daughter, Jeani Hawking. She stated they were late to appt today because "it is hard to get two handicap people all the way from [place] to [place]". She states Dr [name] had mentioned starting patient on medication and they were coming today to discuss this. She states the patient has an appt with neurologist in [place] within the next week and they are just going to follow with them from now on since it is closer. They will discuss with neurologist there about starting medication. Advised they can request records to be sent to them from our medical records department if needed. She verbalized understanding.  Cx appt on 9/18 with Dr [name].

## 2016-08-28 DIAGNOSIS — F4329 Adjustment disorder with other symptoms: Secondary | ICD-10-CM | POA: Diagnosis not present

## 2016-09-08 ENCOUNTER — Ambulatory Visit: Payer: PPO | Admitting: Neurology

## 2016-09-09 DIAGNOSIS — G608 Other hereditary and idiopathic neuropathies: Secondary | ICD-10-CM | POA: Diagnosis not present

## 2016-09-09 DIAGNOSIS — R51 Headache: Secondary | ICD-10-CM | POA: Diagnosis not present

## 2016-09-09 DIAGNOSIS — R2689 Other abnormalities of gait and mobility: Secondary | ICD-10-CM | POA: Diagnosis not present

## 2016-09-16 DIAGNOSIS — R0602 Shortness of breath: Secondary | ICD-10-CM | POA: Diagnosis not present

## 2016-09-16 DIAGNOSIS — R42 Dizziness and giddiness: Secondary | ICD-10-CM | POA: Diagnosis not present

## 2016-09-16 DIAGNOSIS — E782 Mixed hyperlipidemia: Secondary | ICD-10-CM | POA: Diagnosis not present

## 2016-09-24 ENCOUNTER — Ambulatory Visit: Payer: PPO | Admitting: Neurology

## 2016-10-10 DIAGNOSIS — H2513 Age-related nuclear cataract, bilateral: Secondary | ICD-10-CM | POA: Diagnosis not present

## 2016-10-14 DIAGNOSIS — I10 Essential (primary) hypertension: Secondary | ICD-10-CM | POA: Diagnosis not present

## 2016-10-14 DIAGNOSIS — E1140 Type 2 diabetes mellitus with diabetic neuropathy, unspecified: Secondary | ICD-10-CM | POA: Diagnosis not present

## 2016-10-14 DIAGNOSIS — E669 Obesity, unspecified: Secondary | ICD-10-CM | POA: Diagnosis not present

## 2016-10-14 DIAGNOSIS — E782 Mixed hyperlipidemia: Secondary | ICD-10-CM | POA: Diagnosis not present

## 2016-10-14 DIAGNOSIS — R42 Dizziness and giddiness: Secondary | ICD-10-CM | POA: Diagnosis not present

## 2016-10-29 DIAGNOSIS — M1711 Unilateral primary osteoarthritis, right knee: Secondary | ICD-10-CM | POA: Diagnosis not present

## 2016-10-29 DIAGNOSIS — M25561 Pain in right knee: Secondary | ICD-10-CM | POA: Diagnosis not present

## 2016-10-29 DIAGNOSIS — M1712 Unilateral primary osteoarthritis, left knee: Secondary | ICD-10-CM | POA: Diagnosis not present

## 2016-11-05 DIAGNOSIS — M1711 Unilateral primary osteoarthritis, right knee: Secondary | ICD-10-CM | POA: Diagnosis not present

## 2016-11-05 DIAGNOSIS — M1712 Unilateral primary osteoarthritis, left knee: Secondary | ICD-10-CM | POA: Diagnosis not present

## 2016-11-10 DIAGNOSIS — Z85828 Personal history of other malignant neoplasm of skin: Secondary | ICD-10-CM | POA: Diagnosis not present

## 2016-11-10 DIAGNOSIS — L578 Other skin changes due to chronic exposure to nonionizing radiation: Secondary | ICD-10-CM | POA: Diagnosis not present

## 2016-11-10 DIAGNOSIS — L570 Actinic keratosis: Secondary | ICD-10-CM | POA: Diagnosis not present

## 2016-11-10 DIAGNOSIS — L821 Other seborrheic keratosis: Secondary | ICD-10-CM | POA: Diagnosis not present

## 2016-11-19 DIAGNOSIS — B9689 Other specified bacterial agents as the cause of diseases classified elsewhere: Secondary | ICD-10-CM | POA: Diagnosis not present

## 2016-11-19 DIAGNOSIS — J029 Acute pharyngitis, unspecified: Secondary | ICD-10-CM | POA: Diagnosis not present

## 2016-11-19 DIAGNOSIS — J019 Acute sinusitis, unspecified: Secondary | ICD-10-CM | POA: Diagnosis not present

## 2016-11-26 DIAGNOSIS — M1711 Unilateral primary osteoarthritis, right knee: Secondary | ICD-10-CM | POA: Diagnosis not present

## 2016-11-26 DIAGNOSIS — M1712 Unilateral primary osteoarthritis, left knee: Secondary | ICD-10-CM | POA: Diagnosis not present

## 2016-12-03 DIAGNOSIS — H2513 Age-related nuclear cataract, bilateral: Secondary | ICD-10-CM | POA: Diagnosis not present

## 2016-12-09 ENCOUNTER — Encounter: Payer: Self-pay | Admitting: Emergency Medicine

## 2016-12-09 ENCOUNTER — Emergency Department
Admission: EM | Admit: 2016-12-09 | Discharge: 2016-12-09 | Disposition: A | Discharge status: Home / Self Care | Payer: PPO | Attending: Emergency Medicine | Admitting: Emergency Medicine

## 2016-12-09 ENCOUNTER — Emergency Department: Payer: PPO

## 2016-12-09 DIAGNOSIS — I10 Essential (primary) hypertension: Secondary | ICD-10-CM | POA: Diagnosis not present

## 2016-12-09 DIAGNOSIS — Z7984 Long term (current) use of oral hypoglycemic drugs: Secondary | ICD-10-CM | POA: Diagnosis not present

## 2016-12-09 DIAGNOSIS — Z79899 Other long term (current) drug therapy: Secondary | ICD-10-CM | POA: Diagnosis not present

## 2016-12-09 DIAGNOSIS — Z7982 Long term (current) use of aspirin: Secondary | ICD-10-CM | POA: Diagnosis not present

## 2016-12-09 DIAGNOSIS — R002 Palpitations: Secondary | ICD-10-CM | POA: Insufficient documentation

## 2016-12-09 DIAGNOSIS — Z87891 Personal history of nicotine dependence: Secondary | ICD-10-CM | POA: Insufficient documentation

## 2016-12-09 DIAGNOSIS — J449 Chronic obstructive pulmonary disease, unspecified: Secondary | ICD-10-CM | POA: Insufficient documentation

## 2016-12-09 DIAGNOSIS — R0789 Other chest pain: Secondary | ICD-10-CM | POA: Diagnosis not present

## 2016-12-09 DIAGNOSIS — E119 Type 2 diabetes mellitus without complications: Secondary | ICD-10-CM | POA: Diagnosis not present

## 2016-12-09 DIAGNOSIS — Z85828 Personal history of other malignant neoplasm of skin: Secondary | ICD-10-CM | POA: Diagnosis not present

## 2016-12-09 DIAGNOSIS — R079 Chest pain, unspecified: Secondary | ICD-10-CM | POA: Diagnosis not present

## 2016-12-09 LAB — CBC WITH DIFFERENTIAL/PLATELET
BASOS ABS: 0.1 10*3/uL (ref 0–0.1)
BASOS PCT: 1 %
EOS PCT: 3 %
Eosinophils Absolute: 0.2 10*3/uL (ref 0–0.7)
HCT: 48.4 % (ref 40.0–52.0)
Hemoglobin: 15.8 g/dL (ref 13.0–18.0)
LYMPHS PCT: 24 %
Lymphs Abs: 1.6 10*3/uL (ref 1.0–3.6)
MCH: 28.8 pg (ref 26.0–34.0)
MCHC: 32.6 g/dL (ref 32.0–36.0)
MCV: 88.2 fL (ref 80.0–100.0)
MONO ABS: 0.8 10*3/uL (ref 0.2–1.0)
Monocytes Relative: 11 %
Neutro Abs: 4 10*3/uL (ref 1.4–6.5)
Neutrophils Relative %: 61 %
PLATELETS: 186 10*3/uL (ref 150–440)
RBC: 5.49 MIL/uL (ref 4.40–5.90)
RDW: 15 % — AB (ref 11.5–14.5)
WBC: 6.6 10*3/uL (ref 3.8–10.6)

## 2016-12-09 LAB — COMPREHENSIVE METABOLIC PANEL
ALT: 21 U/L (ref 17–63)
ANION GAP: 9 (ref 5–15)
AST: 30 U/L (ref 15–41)
Albumin: 3.9 g/dL (ref 3.5–5.0)
Alkaline Phosphatase: 89 U/L (ref 38–126)
BUN: 11 mg/dL (ref 6–20)
CHLORIDE: 96 mmol/L — AB (ref 101–111)
CO2: 27 mmol/L (ref 22–32)
Calcium: 9.1 mg/dL (ref 8.9–10.3)
Creatinine, Ser: 1.45 mg/dL — ABNORMAL HIGH (ref 0.61–1.24)
GFR calc Af Amer: 49 mL/min — ABNORMAL LOW (ref >60)
GFR, EST NON AFRICAN AMERICAN: 42 mL/min — AB (ref >60)
Glucose, Bld: 130 mg/dL — ABNORMAL HIGH (ref 65–99)
POTASSIUM: 3.3 mmol/L — AB (ref 3.5–5.1)
Sodium: 132 mmol/L — ABNORMAL LOW (ref 135–145)
Total Bilirubin: 0.3 mg/dL (ref 0.3–1.2)
Total Protein: 6.7 g/dL (ref 6.5–8.1)

## 2016-12-09 LAB — TROPONIN I
TROPONIN I: 0.03 ng/mL — AB (ref <0.03)
Troponin I: <0.03 ng/mL (ref <0.03)

## 2016-12-09 MED ORDER — HYDROCHLOROTHIAZIDE 25 MG PO TABS
25.0000 mg | ORAL_TABLET | Freq: Once | ORAL | Status: AC
  Administered 2016-12-09: 25 mg via ORAL
  Filled 2016-12-09: qty 1

## 2016-12-09 MED ORDER — METOPROLOL SUCCINATE ER 50 MG PO TB24
25.0000 mg | ORAL_TABLET | Freq: Once | ORAL | Status: AC
  Administered 2016-12-09: 25 mg via ORAL
  Filled 2016-12-09: qty 1

## 2016-12-09 MED ORDER — POTASSIUM CHLORIDE CRYS ER 20 MEQ PO TBCR
20.0000 meq | EXTENDED_RELEASE_TABLET | Freq: Once | ORAL | Status: AC
  Administered 2016-12-09: 20 meq via ORAL
  Filled 2016-12-09: qty 1

## 2016-12-09 MED ORDER — HYDRALAZINE HCL 50 MG PO TABS
100.0000 mg | ORAL_TABLET | Freq: Once | ORAL | Status: AC
  Administered 2016-12-09: 100 mg via ORAL
  Filled 2016-12-09: qty 2

## 2016-12-09 MED ORDER — LOSARTAN POTASSIUM 50 MG PO TABS
100.0000 mg | ORAL_TABLET | Freq: Once | ORAL | Status: AC
  Administered 2016-12-09: 100 mg via ORAL
  Filled 2016-12-09: qty 2

## 2016-12-09 NOTE — ED Notes (Signed)
ED Provider at bedside. 

## 2016-12-09 NOTE — ED Notes (Signed)
Pt given sandwich tray; understands that we will re-draw troponin at 1300.

## 2016-12-09 NOTE — ED Notes (Signed)
Patient transported to X-ray 

## 2016-12-09 NOTE — ED Provider Notes (Addendum)
[facility] Provider Note   CSN: [id number] Arrival date & time: 12/09/16  [id number]     History   Chief Complaint Chief Complaint  Patient presents with  . Chest Pain    HPI Gary Chang is a 82 y.o. male hx of AAA s/p repair, COPD, GERD, HTN, Diabetes here presenting with heart palpitations, hypertension. Patient states that he usually wakes up several times a night and uses the bathroom due to his enlarged prostate. However, last night he was not feeling well. He states that "his heart was working hard" and had occasional chest pressure. Denies any shortness of breath or actual chest pain. He checked his blood pressure and it was around 200/100. He didn't take BP meds this morning and is on 4 BP meds at baseline. He has hx of AAA but denies any abdominal pain or vomiting. He has mild headaches as well but denies numbness or weakness.    The history is provided by the patient.    Past Medical History:  Diagnosis Date  . Abdominal aortic aneurysm (AAA) ([place])   . Allergic state   . Anxiety   . Arthritis   . Cancer ([place])    skin ca removed  . COPD (chronic obstructive pulmonary disease) ([place])   . Depression   . Diabetes mellitus    tingling in feet  . Enlarged prostate    laser surgery  . GERD (gastroesophageal reflux disease)   . H/O hiatal hernia   . High cholesterol   . Hypertension    pcp  dr Jeneen Rinks [place]  [place]  . Kidney stones   . Melanoma ([place])   . 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 Tension headache 04/30/2016  . Atypical facial pain 04/30/2016  . Left-sided weakness   . Occlusion and stenosis of basilar artery   . Vertebral artery stenosis   . Basilar artery stenosis 02/28/2016  . Dizziness and giddiness 11/29/2013  . Abnormality of gait 11/29/2013  . Diabetic neuropathy ([place]) 11/29/2013  . Memory loss 11/29/2013  . Headache, paroxysmal hemicrania, episodic 11/29/2013    Past  Surgical History:  Procedure Laterality Date  . ABDOMINAL AORTIC ANEURYSM REPAIR    . BACK SURGERY     lumbar  ,cervical surgeries  . ESOPHAGOGASTRODUODENOSCOPY (EGD) WITH PROPOFOL N/A 02/11/2016   Procedure: ESOPHAGOGASTRODUODENOSCOPY (EGD) WITH PROPOFOL;  Surgeon: Hulen Luster, MD;  Location: [facility] ENDOSCOPY;  Service: Gastroenterology;  Laterality: N/A;  . HERNIA REPAIR    . KIDNEY STONE SURGERY    . LUMBAR LAMINECTOMY/DECOMPRESSION MICRODISCECTOMY  12/10/2011   Procedure: LUMBAR LAMINECTOMY/DECOMPRESSION MICRODISCECTOMY;  Surgeon: Ophelia Charter;  Location: [facility] NEURO ORS;  Service: Neurosurgery;;  Thoracic One-Two Laminectomy       Home Medications    Prior to Admission medications   Medication Sig Start Date End Date Taking? Authorizing Provider  aspirin EC 81 MG tablet Take 81 mg by mouth at bedtime.     Yes Historical Provider, MD  carbamazepine (TEGRETOL XR) 100 MG 12 hr tablet Take 100 mg by mouth 2 (two) times daily. 09/09/16  Yes Historical Provider, MD  Cholecalciferol (VITAMIN D3 SUPER STRENGTH) 2000 UNITS TABS Take 2,000 Units by mouth daily.     Yes Historical Provider, MD  clopidogrel (PLAVIX) 75 MG tablet Take 1 tablet (75 mg total) by mouth daily. Patient taking differently: Take 75 mg by mouth every evening.  03/01/16  Yes Norman Herrlich, MD  donepezil (ARICEPT) 5 MG tablet Take 5 mg by mouth every morning. 04/16/16  Yes Historical Provider, MD  glipiZIDE (GLUCOTROL XL) 2.5 MG 24 hr tablet Take 2.5 mg by mouth at bedtime.     Yes Historical Provider, MD  Glucos-Chondroit-Hyaluron-MSM (GLUCOSAMINE CHONDROITIN JOINT) TABS Take 2,000 mg by mouth daily.   Yes Historical Provider, MD  hydrALAZINE (APRESOLINE) 100 MG tablet Take 100 mg by mouth 3 (three) times daily.  11/02/13  Yes Historical Provider, MD  hydrochlorothiazide (HYDRODIURIL) 25 MG tablet Take 25 mg by mouth every morning.    Yes Historical Provider, MD  LORazepam (ATIVAN) 1 MG tablet Take 1-2 mg by mouth 2 (two)  times daily.   Yes Historical Provider, MD  losartan (COZAAR) 100 MG tablet Take 100 mg by mouth every evening.    Yes Historical Provider, MD  MAGNESIUM PO Take 600 mg by mouth every evening.   Yes Historical Provider, MD  metoprolol succinate (TOPROL-XL) 25 MG 24 hr tablet Take 12.5 mg by mouth every morning.    Yes Historical Provider, MD  potassium chloride (K-DUR) 10 MEQ tablet Take 20 mEq by mouth 2 (two) times daily. 05/05/16  Yes Historical Provider, MD  sertraline (ZOLOFT) 100 MG tablet Take 100 mg by mouth daily.   Yes Historical Provider, MD  tamsulosin (FLOMAX) 0.4 MG CAPS capsule Take 0.4 mg by mouth daily.    Yes Historical Provider, MD  HYDROcodone-acetaminophen (NORCO/VICODIN) 5-325 MG per tablet Take 1 tablet by mouth 2 (two) times daily as needed for moderate pain.  10/25/13   Historical Provider, MD    Family History Family History  Problem Relation Age of Onset  . Heart Problems Mother   . Tuberculosis Father   . Anesthesia problems Neg Hx   . Hypotension Neg Hx   . Malignant hyperthermia Neg Hx   . Pseudochol deficiency Neg Hx     Social History Social History  Substance Use Topics  . Smoking status: Former Smoker    Types: Pipe  . Smokeless tobacco: Never Used  . Alcohol use Yes     Comment: occ 1-2 beers a month     Allergies   Ezetimibe and Statins   Review of Systems Review of Systems  Cardiovascular: Positive for palpitations.  All other systems reviewed and are negative.    Physical Exam Updated Vital Signs BP 132/80   Pulse 92   Temp 98 F (36.7 C)   Resp (!) 32   Ht 5\' 8"  (1.727 m)   Wt 225 lb (102.1 kg)   SpO2 97%   BMI 34.21 kg/m   Physical Exam  Constitutional: He is oriented to person, place, and time.  Chronically ill, NAD   HENT:  Head: Normocephalic.  Mouth/Throat: Oropharynx is clear and moist.  Eyes: EOM are normal. Pupils are equal, round, and reactive to light.  Neck: Normal range of motion. Neck supple.    Cardiovascular: Normal heart sounds.   Irregular   Pulmonary/Chest: Effort normal and breath sounds normal. No respiratory distress. He has no wheezes.  Abdominal: Soft. Bowel sounds are normal.  + midline incision scar healing well and umbilical hernia that is reducible, nontender. No obvious pulsatile mass   Musculoskeletal: Normal range of motion.  Neurological: He is alert and oriented to person, place, and time. No cranial nerve deficit. Coordination normal.  CN 2-12 intact, nl strength throughout   Skin: Skin is warm.  Psychiatric: He has a normal mood and affect.  Nursing note and vitals  reviewed.    ED Treatments / Results  Labs (all labs ordered are listed, but only abnormal results are displayed) Labs Reviewed  CBC WITH DIFFERENTIAL/PLATELET - Abnormal; Notable for the following:       Result Value   RDW 15.0 (*)    All other components within normal limits  COMPREHENSIVE METABOLIC PANEL - Abnormal; Notable for the following:    Sodium 132 (*)    Potassium 3.3 (*)    Chloride 96 (*)    Glucose, Bld 130 (*)    Creatinine, Ser 1.45 (*)    GFR calc non Af Amer 42 (*)    GFR calc Af Amer 49 (*)    All other components within normal limits  TROPONIN I - Abnormal; Notable for the following:    Troponin I 0.03 (*)    All other components within normal limits  TROPONIN I  TROPONIN I    EKG  EKG Interpretation None      ED ECG REPORT I, Wandra Arthurs, the attending physician, personally viewed and interpreted this ECG.   Date: 12/09/2016  EKG Time: 7:43 am  Rate: 89  Rhythm: atrial flutter, rate 89  Axis: normal  Intervals:none  ST&T Change: nonspecific   Radiology Dg Chest 2 View  Result Date: 12/09/2016 CLINICAL DATA:  Chest discomfort. EXAM: CHEST  2 VIEW COMPARISON:  02/21/2015 FINDINGS: The heart size and mediastinal contours are within normal limits. Both lungs are clear. Prior lower cervical ACDF. IMPRESSION: No active cardiopulmonary disease.  Electronically Signed   By: Kathreen Devoid   On: 12/09/2016 08:22    Procedures Procedures (including critical care time)  Medications Ordered in ED Medications  hydrALAZINE (APRESOLINE) tablet 100 mg (100 mg Oral Given 12/09/16 0811)  losartan (COZAAR) tablet 100 mg (100 mg Oral Given 12/09/16 0811)  hydrochlorothiazide (HYDRODIURIL) tablet 25 mg (25 mg Oral Given 12/09/16 0811)  metoprolol succinate (TOPROL-XL) 24 hr tablet 25 mg (25 mg Oral Given 12/09/16 0811)  potassium chloride SA (K-DUR,KLOR-CON) CR tablet 20 mEq (20 mEq Oral Given 12/09/16 1023)     Initial Impression / Assessment and Plan / ED Course  I have reviewed the triage vital signs and the nursing notes.  Pertinent labs & imaging results that were available during my care of the patient were reviewed by me and considered in my medical decision making (see chart for details).  Clinical Course     Gary Chang is a 82 y.o. male here with chest palpitations, hypertension. Hx of afib and not on blood thinners, hx of HTN but didn't take BP meds this morning. Likely symptomatic hypertension. Consider hypertensive urgency vs ACS as well. Will get labs, trop x 2, CXR. Low suspicion for dissection or ruptured AAA and if CXR unremarkable, will not need CT. Will give morning BP meds and recheck BP.   2:10 PM Second trop was borderline at 0.03. Repeat 3rd trop was < 0.03. BP down to 130s. Told him to take BP meds as scheduled. K 3.2, supplemented. Recommend repeat BMP in a week, BP check with PCP in a week.   Final Clinical Impressions(s) / ED Diagnoses   Final diagnoses:  None    New Prescriptions New Prescriptions   No medications on file     Drenda Freeze, MD 12/09/16 Launiupoko Yao, MD 12/09/16 1415

## 2016-12-09 NOTE — Discharge Instructions (Signed)
You need to take your blood pressure medicines as prescribed.   Avoid taking supplements  Your potassium is slightly low. Repeat chemistry in a week with your doctor.   Recheck blood pressure with your doctor in a week   Return to ER if you have chest pain, shortness of breath, abdominal pain, headaches, weakness.

## 2016-12-09 NOTE — ED Notes (Addendum)
Pt also c/o dizziness that "comes and goes for years" but has been more severe lately, especially with standing up and "sudden turns." He states that his headache has been going on "for a long time" with occasional shooting pains.

## 2016-12-09 NOTE — ED Triage Notes (Addendum)
Pt via ems for chest discomfort and feeling of "heart working hard." Pt reports that he also has headache with occasional shooting pains. Pt has no cardiac hx, but has repaired abdominal aneurysm with hernia present. Pt states his bp is generally in [id number] systolic, but has been running over 200 lately. BP at triage 212/99. Pt alert & oriented; NAD noted.

## 2016-12-11 ENCOUNTER — Ambulatory Visit
Admission: RE | Admit: 2016-12-11 | Discharge: 2016-12-11 | Disposition: A | Discharge status: Home / Self Care | Payer: PPO | Source: Ambulatory Visit | Attending: Ophthalmology | Admitting: Ophthalmology

## 2016-12-11 ENCOUNTER — Ambulatory Visit: Payer: PPO | Admitting: Anesthesiology

## 2016-12-11 ENCOUNTER — Encounter: Payer: Self-pay | Admitting: *Deleted

## 2016-12-11 ENCOUNTER — Encounter
Admission: RE | Disposition: A | Discharge status: Home / Self Care | Payer: Self-pay | Source: Ambulatory Visit | Attending: Ophthalmology

## 2016-12-11 DIAGNOSIS — H2512 Age-related nuclear cataract, left eye: Secondary | ICD-10-CM | POA: Diagnosis not present

## 2016-12-11 DIAGNOSIS — Z79899 Other long term (current) drug therapy: Secondary | ICD-10-CM | POA: Insufficient documentation

## 2016-12-11 DIAGNOSIS — F419 Anxiety disorder, unspecified: Secondary | ICD-10-CM | POA: Insufficient documentation

## 2016-12-11 DIAGNOSIS — K219 Gastro-esophageal reflux disease without esophagitis: Secondary | ICD-10-CM | POA: Diagnosis not present

## 2016-12-11 DIAGNOSIS — R51 Headache: Secondary | ICD-10-CM | POA: Insufficient documentation

## 2016-12-11 DIAGNOSIS — Z87891 Personal history of nicotine dependence: Secondary | ICD-10-CM | POA: Insufficient documentation

## 2016-12-11 DIAGNOSIS — J449 Chronic obstructive pulmonary disease, unspecified: Secondary | ICD-10-CM | POA: Diagnosis not present

## 2016-12-11 DIAGNOSIS — E1136 Type 2 diabetes mellitus with diabetic cataract: Secondary | ICD-10-CM | POA: Insufficient documentation

## 2016-12-11 DIAGNOSIS — M1990 Unspecified osteoarthritis, unspecified site: Secondary | ICD-10-CM | POA: Insufficient documentation

## 2016-12-11 DIAGNOSIS — I739 Peripheral vascular disease, unspecified: Secondary | ICD-10-CM | POA: Insufficient documentation

## 2016-12-11 DIAGNOSIS — I10 Essential (primary) hypertension: Secondary | ICD-10-CM | POA: Diagnosis not present

## 2016-12-11 DIAGNOSIS — F329 Major depressive disorder, single episode, unspecified: Secondary | ICD-10-CM | POA: Insufficient documentation

## 2016-12-11 DIAGNOSIS — K449 Diaphragmatic hernia without obstruction or gangrene: Secondary | ICD-10-CM | POA: Diagnosis not present

## 2016-12-11 DIAGNOSIS — H2513 Age-related nuclear cataract, bilateral: Secondary | ICD-10-CM | POA: Diagnosis not present

## 2016-12-11 HISTORY — PX: CATARACT EXTRACTION W/PHACO: SHX586

## 2016-12-11 HISTORY — DX: Pneumonia, unspecified organism: J18.9

## 2016-12-11 LAB — GLUCOSE, CAPILLARY: Glucose-Capillary: 133 mg/dL — ABNORMAL HIGH (ref 65–99)

## 2016-12-11 SURGERY — PHACOEMULSIFICATION, CATARACT, WITH IOL INSERTION
Anesthesia: Monitor Anesthesia Care | Site: Eye | Laterality: Left | Wound class: Clean

## 2016-12-11 MED ORDER — NEOMYCIN-POLYMYXIN-DEXAMETH 0.1 % OP OINT
TOPICAL_OINTMENT | OPHTHALMIC | Status: DC | PRN
  Administered 2016-12-11: 1 via OPHTHALMIC

## 2016-12-11 MED ORDER — ARMC OPHTHALMIC DILATING DROPS
OPHTHALMIC | Status: AC
  Administered 2016-12-11: 1 via OPHTHALMIC
  Filled 2016-12-11: qty 0.4

## 2016-12-11 MED ORDER — FENTANYL CITRATE (PF) 100 MCG/2ML IJ SOLN
INTRAMUSCULAR | Status: DC | PRN
  Administered 2016-12-11 (×2): 25 ug via INTRAVENOUS

## 2016-12-11 MED ORDER — LIDOCAINE HCL (PF) 4 % IJ SOLN
INTRAOCULAR | Status: DC | PRN
  Administered 2016-12-11: 4 mL via OPHTHALMIC

## 2016-12-11 MED ORDER — CARBACHOL 0.01 % IO SOLN
INTRAOCULAR | Status: DC | PRN
  Administered 2016-12-11: 0.5 mL via INTRAOCULAR

## 2016-12-11 MED ORDER — ARMC OPHTHALMIC DILATING DROPS
1.0000 "application " | OPHTHALMIC | Status: AC
  Administered 2016-12-11 (×3): 1 via OPHTHALMIC

## 2016-12-11 MED ORDER — ARMC OPHTHALMIC DILATING DROPS: 1.0000 "application " | OPHTHALMIC | Status: DC

## 2016-12-11 MED ORDER — MIDAZOLAM HCL 2 MG/2ML IJ SOLN
INTRAMUSCULAR | Status: AC
  Filled 2016-12-11: qty 2

## 2016-12-11 MED ORDER — MOXIFLOXACIN HCL 0.5 % OP SOLN
OPHTHALMIC | Status: AC
  Administered 2016-12-11: 1 [drp] via OPHTHALMIC
  Filled 2016-12-11: qty 3

## 2016-12-11 MED ORDER — FENTANYL CITRATE (PF) 100 MCG/2ML IJ SOLN
INTRAMUSCULAR | Status: AC
  Filled 2016-12-11: qty 2

## 2016-12-11 MED ORDER — BSS IO SOLN
INTRAOCULAR | Status: DC | PRN
  Administered 2016-12-11: 09:00:00 via OPHTHALMIC

## 2016-12-11 MED ORDER — NA HYALUR & NA CHOND-NA HYALUR 0.4-0.35 ML IO KIT
PACK | INTRAOCULAR | Status: DC | PRN
  Administered 2016-12-11: .35 mL via INTRAOCULAR

## 2016-12-11 MED ORDER — SODIUM CHLORIDE 0.9 % IV SOLN
INTRAVENOUS | Status: DC
  Administered 2016-12-11 (×2): via INTRAVENOUS

## 2016-12-11 MED ORDER — MOXIFLOXACIN HCL 0.5 % OP SOLN
1.0000 [drp] | OPHTHALMIC | Status: DC
  Administered 2016-12-11 (×3): 1 [drp] via OPHTHALMIC

## 2016-12-11 SURGICAL SUPPLY — 22 items
CANNULA ANT/CHMB 27GA (MISCELLANEOUS) ×3 IMPLANT
CUP MEDICINE 2OZ PLAST GRAD ST (MISCELLANEOUS) ×3 IMPLANT
GLOVE BIO SURGEON STRL SZ8 (GLOVE) ×3 IMPLANT
GLOVE BIOGEL M 6.5 STRL (GLOVE) ×3 IMPLANT
GLOVE SURG LX 7.5 STRW (GLOVE) ×2
GLOVE SURG LX STRL 7.5 STRW (GLOVE) ×1 IMPLANT
GOWN STRL REUS W/ TWL LRG LVL3 (GOWN DISPOSABLE) ×2 IMPLANT
GOWN STRL REUS W/TWL LRG LVL3 (GOWN DISPOSABLE) ×4
KIT SLEEVE INFUSION .9 MICRO (MISCELLANEOUS) ×3 IMPLANT
LENS IOL TECNIS ITEC 22.5 (Intraocular Lens) ×3 IMPLANT
PACK CATARACT (MISCELLANEOUS) ×3 IMPLANT
PACK CATARACT BRASINGTON LX (MISCELLANEOUS) ×3 IMPLANT
PACK EYE AFTER SURG (MISCELLANEOUS) ×3 IMPLANT
SOL BSS BAG (MISCELLANEOUS) ×3
SOL PREP PVP 2OZ (MISCELLANEOUS) ×3
SOLUTION BSS BAG (MISCELLANEOUS) ×1 IMPLANT
SOLUTION PREP PVP 2OZ (MISCELLANEOUS) ×1 IMPLANT
SYR 3ML LL SCALE MARK (SYRINGE) ×3 IMPLANT
SYR 5ML LL (SYRINGE) ×3 IMPLANT
SYR TB 1ML 27GX1/2 LL (SYRINGE) ×3 IMPLANT
WATER STERILE IRR 250ML POUR (IV SOLUTION) ×3 IMPLANT
WIPE NON LINTING 3.25X3.25 (MISCELLANEOUS) ×3 IMPLANT

## 2016-12-11 NOTE — Discharge Instructions (Signed)
Eye Surgery Discharge Instructions  Expect mild scratchy sensation or mild soreness. DO NOT RUB YOUR EYE!  The day of surgery:  Minimal physical activity, but bed rest is not required  No reading, computer work, or close hand work  No bending, lifting, or straining.  May watch TV  For 24 hours:  No driving, legal decisions, or alcoholic beverages  Safety precautions  Eat anything you prefer: It is better to start with liquids, then soup then solid foods.  _____ Eye patch should be worn until postoperative exam tomorrow.  ____ Solar shield eyeglasses should be worn for comfort in the sunlight/patch while sleeping  Resume all regular medications including aspirin or Coumadin if these were discontinued prior to surgery. You may shower, bathe, shave, or wash your hair. Tylenol may be taken for mild discomfort.  Call your doctor if you experience significant pain, nausea, or vomiting, fever > 101 or other signs of infection. [phone] or [phone] Specific instructions:  Follow-up Information    Leandrew Koyanagi, MD Follow up.   Specialty:  Ophthalmology Why:  12/12/16 at 10:40 Contact information: [place] [address] [phone]          Eye Surgery Discharge Instructions  Expect mild scratchy sensation or mild soreness. DO NOT RUB YOUR EYE!  The day of surgery:  Minimal physical activity, but bed rest is not required  No reading, computer work, or close hand work  No bending, lifting, or straining.  May watch TV  For 24 hours:  No driving, legal decisions, or alcoholic beverages  Safety precautions  Eat anything you prefer: It is better to start with liquids, then soup then solid foods.  _____ Eye patch should be worn until postoperative exam tomorrow.  ____ Solar shield eyeglasses should be worn for comfort in the sunlight/patch while sleeping  Resume all regular medications including aspirin or Coumadin if these  were discontinued prior to surgery. You may shower, bathe, shave, or wash your hair. Tylenol may be taken for mild discomfort.  Call your doctor if you experience significant pain, nausea, or vomiting, fever > 101 or other signs of infection. [phone] or [phone] Specific instructions:  Follow-up Information    Leandrew Koyanagi, MD Follow up.   Specialty:  Ophthalmology Why:  12/12/16 at 10:40 Contact information: [address]   [place] [address] [phone]

## 2016-12-11 NOTE — H&P (Signed)
The History and Physical notes are on paper, have been signed, and are to be scanned. The patient remains stable and unchanged from the H&P.   Previous H&P reviewed, patient examined, and there are no changes.  Gary Chang 12/11/2016 8:10 AM

## 2016-12-11 NOTE — Anesthesia Preprocedure Evaluation (Addendum)
Anesthesia Evaluation  Patient identified by MRN, date of birth, ID band Patient awake    Reviewed: Allergy & Precautions, H&P , NPO status , Patient's Chart, lab work & pertinent test results, reviewed documented beta blocker date and time   Airway Mallampati: III  TM Distance: >3 FB Neck ROM: full    Dental no notable dental hx. (+) Caps   Pulmonary neg shortness of breath, neg sleep apnea, pneumonia, resolved, COPD,  COPD inhaler, neg recent URI, former smoker,    Pulmonary exam normal breath sounds clear to auscultation       Cardiovascular Exercise Tolerance: Good hypertension, Pt. on medications (-) angina+ Peripheral Vascular Disease (AAA s/p repair in 2010)  (-) CAD, (-) Past MI, (-) Cardiac Stents and (-) CABG Normal cardiovascular exam(-) dysrhythmias (-) Valvular Problems/Murmurs Rhythm:regular Rate:Normal     Neuro/Psych  Headaches, PSYCHIATRIC DISORDERS (Depression) Anxiety Depression negative neurological ROS     GI/Hepatic Neg liver ROS, hiatal hernia, GERD  ,  Endo/Other  diabetes  Renal/GU Renal disease (kidney stones)  negative genitourinary   Musculoskeletal  (+) Arthritis , Osteoarthritis,    Abdominal   Peds negative pediatric ROS (+)  Hematology negative hematology ROS (+)   Anesthesia Other Findings Past Medical History:   High cholesterol                                             Diabetes mellitus                                              Comment:tingling in feet   Arthritis                                                    GERD (gastroesophageal reflux disease)                       H/O hiatal hernia                                            Enlarged prostate                                              Comment:laser surgery   Hypertension                                                   Comment:pcp  dr Jeneen Rinks hedrick  [place]   Cancer [facility])  Comment:skin ca removed   Anxiety                                                      Allergic state                                               Abdominal aortic aneurysm (AAA) ([facility])                        COPD (chronic obstructive pulmonary disease) (*              Depression                                                   Kidney stones                                                Melanoma ([place])                                               Renal artery aneurysm ([facility])                                  Reproductive/Obstetrics negative OB ROS                             Anesthesia Physical  Anesthesia Plan  ASA: III  Anesthesia Plan: MAC   Post-op Pain Management:    Induction:   Airway Management Planned: Nasal Cannula  Additional Equipment:   Intra-op Plan:   Post-operative Plan:   Informed Consent: I have reviewed the patients History and Physical, chart, labs and discussed the procedure including the risks, benefits and alternatives for the proposed anesthesia with the patient or authorized representative who has indicated his/her understanding and acceptance.   Dental Advisory Given  Plan Discussed with: Anesthesiologist, CRNA and Surgeon  Anesthesia Plan Comments:         Anesthesia Quick Evaluation

## 2016-12-11 NOTE — Anesthesia Postprocedure Evaluation (Signed)
Anesthesia Post Note  Patient: Gary Chang ASA  Procedure(s) Performed: Procedure(s) (LRB): CATARACT EXTRACTION PHACO AND INTRAOCULAR LENS PLACEMENT (IOC) (Left)  Patient location during evaluation: PACU Anesthesia Type: MAC Level of consciousness: awake and alert and oriented Pain management: pain level controlled Vital Signs Assessment: post-procedure vital signs reviewed and stable Respiratory status: spontaneous breathing Cardiovascular status: blood pressure returned to baseline Anesthetic complications: no     Last Vitals:  Vitals:   12/11/16 0912 12/11/16 0918  BP: (!) 175/76 (!) 175/86  Pulse: 79 80  Resp: 18   Temp: 36.4 C     Last Pain:  Vitals:   12/11/16 0912  TempSrc: Tympanic                 Tawana Pasch

## 2016-12-11 NOTE — Op Note (Signed)
OPERATIVE NOTE  ABINADI GAMBOA [id number] 12/11/2016   PREOPERATIVE DIAGNOSIS:  Nuclear Sclerotic Cataract Right Eye H25.11   POSTOPERATIVE DIAGNOSIS: Nuclear Sclerotic Cataract Right Eye H25.11          PROCEDURE:  Phacoemusification with posterior chamber intraocular lens placement of the right eye   LENS:   Implant Name Type Inv. Item Serial No. Manufacturer Lot No. LRB No. Used  LENS IOL DIOP 22.5 - [id number] Intraocular Lens LENS IOL DIOP 22.5 [id number]   Left 1       ULTRASOUND TIME: 18 %  of 1 minutes 17 seconds, CDE 13.8  SURGEON:  Wyonia Hough, MD   ANESTHESIA:  Topical with tetracaine drops and 2% Xylocaine jelly, augmented with 1% preservative-free intracameral lidocaine.    COMPLICATIONS:  None.   DESCRIPTION OF PROCEDURE:  The patient was identified in the holding room and transported to the operating room and placed in the supine position under the operating microscope. Theright eye was identified as the operative eye and it was prepped and draped in the usual sterile ophthalmic fashion.   A 1 millimeter clear-corneal paracentesis was made at the 12:00 position.  0.5 ml of preservative-free 1% lidocaine was injected into the anterior chamber. The anterior chamber was filled with Viscoat viscoelastic.  A 2.4 millimeter keratome was used to make a near-clear corneal incision at the 9:00 position. A curvilinear capsulorrhexis was made with a cystotome and capsulorrhexis forceps.  Balanced salt solution was used to hydrodissect and hydrodelineate the nucleus.   Phacoemulsification was then used in stop and chop fashion to remove the lens nucleus and epinucleus.  The remaining cortex was then removed using the irrigation and aspiration handpiece. Provisc was then placed into the capsular bag to distend it for lens placement.  A lens was then injected into the capsular bag.  The remaining viscoelastic was aspirated.  Wounds were hydrated with balanced  salt solution.  The anterior chamber was inflated to a physiologic pressure with balanced salt solution. Vigamox 0.2 ml of a 1mg  per ml solution was injected into the anterior chamber for a dose of 0.2 mg of intracameral antibiotic at the completion of the case. Miostat was placed into the anterior chamber to constrict the pupil.  No wound leaks were noted.  Topical Vigamox drops and Maxitrol ointment were applied to the eye.  The patient was taken to the recovery room in stable condition without complications of anesthesia or surgery.  Augusta Mirkin 12/11/2016, 9:06 AM

## 2016-12-11 NOTE — Anesthesia Procedure Notes (Signed)
Procedure Name: MAC Date/Time: 12/11/2016 8:45 AM Performed by: Allean Found Pre-anesthesia Checklist: Patient identified, Emergency Drugs available, Suction available, Patient being monitored and Timeout performed Patient Re-evaluated:Patient Re-evaluated prior to inductionOxygen Delivery Method: Nasal cannula Placement Confirmation: positive ETCO2

## 2016-12-11 NOTE — Transfer of Care (Signed)
Immediate Anesthesia Transfer of Care Note  Patient: Gary Chang  Procedure(s) Performed: Procedure(s) with comments: CATARACT EXTRACTION PHACO AND INTRAOCULAR LENS PLACEMENT (IOC) (Left) - [place] 1:17 AP% 17.7 CDE 13.8 FLUID PACK LOT # [id number]  Patient Location: PACU  Anesthesia Type:MAC  Level of Consciousness: awake, alert  and oriented  Airway & Oxygen Therapy: Patient Spontanous Breathing  Post-op Assessment: Report given to RN and Post -op Vital signs reviewed and stable  Post vital signs: Reviewed and stable  Last Vitals:  Vitals:   12/11/16 0721 12/11/16 0912  BP: (!) 151/75 (!) 175/76  Pulse: 84 79  Resp: 20 18  Temp: 36.5 C 36.4 C    Last Pain:  Vitals:   12/11/16 0912  TempSrc: Tympanic      Patients Stated Pain Goal: 0 ([id number])  Complications: No apparent anesthesia complications

## 2016-12-18 ENCOUNTER — Other Ambulatory Visit: Payer: Self-pay | Admitting: Adult Health

## 2016-12-18 ENCOUNTER — Other Ambulatory Visit: Payer: Self-pay | Admitting: Emergency Medicine

## 2016-12-18 DIAGNOSIS — R1032 Left lower quadrant pain: Secondary | ICD-10-CM

## 2016-12-18 DIAGNOSIS — R1011 Right upper quadrant pain: Secondary | ICD-10-CM | POA: Diagnosis not present

## 2016-12-18 DIAGNOSIS — R1031 Right lower quadrant pain: Secondary | ICD-10-CM | POA: Diagnosis not present

## 2016-12-23 ENCOUNTER — Ambulatory Visit
Admission: RE | Admit: 2016-12-23 | Discharge: 2016-12-23 | Disposition: A | Discharge status: Home / Self Care | Payer: PPO | Source: Ambulatory Visit | Attending: Adult Health | Admitting: Adult Health

## 2016-12-23 DIAGNOSIS — R1031 Right lower quadrant pain: Secondary | ICD-10-CM | POA: Diagnosis not present

## 2016-12-23 DIAGNOSIS — R1011 Right upper quadrant pain: Secondary | ICD-10-CM | POA: Insufficient documentation

## 2016-12-23 DIAGNOSIS — R1032 Left lower quadrant pain: Secondary | ICD-10-CM

## 2016-12-26 ENCOUNTER — Emergency Department
Admission: EM | Admit: 2016-12-26 | Discharge: 2016-12-26 | Disposition: A | Discharge status: Home / Self Care | Payer: PPO | Attending: Emergency Medicine | Admitting: Emergency Medicine

## 2016-12-26 ENCOUNTER — Encounter: Payer: Self-pay | Admitting: Emergency Medicine

## 2016-12-26 ENCOUNTER — Emergency Department: Payer: PPO

## 2016-12-26 DIAGNOSIS — E119 Type 2 diabetes mellitus without complications: Secondary | ICD-10-CM | POA: Insufficient documentation

## 2016-12-26 DIAGNOSIS — R1031 Right lower quadrant pain: Secondary | ICD-10-CM

## 2016-12-26 DIAGNOSIS — Z87891 Personal history of nicotine dependence: Secondary | ICD-10-CM | POA: Diagnosis not present

## 2016-12-26 DIAGNOSIS — I10 Essential (primary) hypertension: Secondary | ICD-10-CM | POA: Diagnosis not present

## 2016-12-26 DIAGNOSIS — J449 Chronic obstructive pulmonary disease, unspecified: Secondary | ICD-10-CM | POA: Insufficient documentation

## 2016-12-26 DIAGNOSIS — Z7984 Long term (current) use of oral hypoglycemic drugs: Secondary | ICD-10-CM | POA: Insufficient documentation

## 2016-12-26 DIAGNOSIS — Z7982 Long term (current) use of aspirin: Secondary | ICD-10-CM | POA: Insufficient documentation

## 2016-12-26 DIAGNOSIS — Z85828 Personal history of other malignant neoplasm of skin: Secondary | ICD-10-CM | POA: Insufficient documentation

## 2016-12-26 LAB — LIPASE, BLOOD: LIPASE: 17 U/L (ref 11–51)

## 2016-12-26 LAB — COMPREHENSIVE METABOLIC PANEL
ALBUMIN: 4.4 g/dL (ref 3.5–5.0)
ALT: 19 U/L (ref 17–63)
AST: 30 U/L (ref 15–41)
Alkaline Phosphatase: 106 U/L (ref 38–126)
Anion gap: 11 (ref 5–15)
BUN: 10 mg/dL (ref 6–20)
CHLORIDE: 91 mmol/L — AB (ref 101–111)
CO2: 28 mmol/L (ref 22–32)
Calcium: 9.4 mg/dL (ref 8.9–10.3)
Creatinine, Ser: 1.3 mg/dL — ABNORMAL HIGH (ref 0.61–1.24)
GFR calc Af Amer: 56 mL/min — ABNORMAL LOW (ref >60)
GFR calc non Af Amer: 48 mL/min — ABNORMAL LOW (ref >60)
GLUCOSE: 130 mg/dL — AB (ref 65–99)
POTASSIUM: 3.3 mmol/L — AB (ref 3.5–5.1)
Sodium: 130 mmol/L — ABNORMAL LOW (ref 135–145)
Total Bilirubin: 0.9 mg/dL (ref 0.3–1.2)
Total Protein: 7.5 g/dL (ref 6.5–8.1)

## 2016-12-26 LAB — CBC
HEMATOCRIT: 47.9 % (ref 40.0–52.0)
Hemoglobin: 16 g/dL (ref 13.0–18.0)
MCH: 29 pg (ref 26.0–34.0)
MCHC: 33.5 g/dL (ref 32.0–36.0)
MCV: 86.5 fL (ref 80.0–100.0)
Platelets: 189 10*3/uL (ref 150–440)
RBC: 5.53 MIL/uL (ref 4.40–5.90)
RDW: 14.5 % (ref 11.5–14.5)
WBC: 7 10*3/uL (ref 3.8–10.6)

## 2016-12-26 MED ORDER — HYDROMORPHONE HCL 1 MG/ML IJ SOLN
0.5000 mg | Freq: Once | INTRAMUSCULAR | Status: AC
  Administered 2016-12-26: 0.5 mg via INTRAVENOUS
  Filled 2016-12-26: qty 1

## 2016-12-26 MED ORDER — IOPAMIDOL (ISOVUE-300) INJECTION 61%
30.0000 mL | Freq: Once | INTRAVENOUS | Status: AC | PRN
  Administered 2016-12-26: 30 mL via ORAL

## 2016-12-26 MED ORDER — TRAMADOL HCL 50 MG PO TABS: 50.0000 mg | ORAL_TABLET | Freq: Four times a day (QID) | ORAL | 0 refills | Status: DC | PRN

## 2016-12-26 MED ORDER — IOPAMIDOL (ISOVUE-300) INJECTION 61%
100.0000 mL | Freq: Once | INTRAVENOUS | Status: AC | PRN
  Administered 2016-12-26: 80 mL via INTRAVENOUS

## 2016-12-26 NOTE — ED Triage Notes (Signed)
C/o RLQ pain X 3 weeks. Denies flank pain. Reports this feels like when had a kidney stone before. PCP did blood, urine and [place] at doctor but pt reports [place] was not where pain was.Marland Kitchen

## 2016-12-26 NOTE — ED Notes (Signed)
Drinking PO contrast.

## 2016-12-26 NOTE — ED Notes (Signed)
Patient transported to CT 

## 2016-12-26 NOTE — ED Provider Notes (Signed)
[facility] Emergency Department Provider Note        Time seen: ----------------------------------------- 8:57 AM on 12/26/2016 -----------------------------------------    I have reviewed the triage vital signs and the nursing notes.   HISTORY  Chief Complaint Abdominal Pain    HPI Gary Chang is a 83 y.o. male who presents to the ER for abdominal pain in the right lower quadrant for the last 3 weeks. Nothing makes pain better or worse. He denies any specific flank pain, fevers, chills, vomiting or other complaints. Patient reports he felt this way in the past when he was diagnosed with a kidney stone. Recently had an outpatient ultrasound that was unremarkable. Nothing makes his pain better or worse, he does report increased bowel gas recently.   Past Medical History:  Diagnosis Date  . Abdominal aortic aneurysm (AAA) ([place])   . Allergic state   . Anxiety   . Arthritis   . Cancer ([place])    skin ca removed  . COPD (chronic obstructive pulmonary disease) ([place])   . Depression   . Diabetes mellitus    tingling in feet  . Enlarged prostate    laser surgery  . GERD (gastroesophageal reflux disease)   . H/O hiatal hernia   . High cholesterol   . Hypertension    pcp  dr Jeneen Rinks [place]  [place]  . Kidney stones   . Melanoma ([place])   . Pneumonia   . Renal artery aneurysm [facility])     Patient Active Problem List   Diagnosis Date Noted  . Tension headache 04/30/2016  . Atypical facial pain 04/30/2016  . Left-sided weakness   . Occlusion and stenosis of basilar artery   . Vertebral artery stenosis   . Basilar artery stenosis 02/28/2016  . Dizziness and giddiness 11/29/2013  . Abnormality of gait 11/29/2013  . Diabetic neuropathy ([place]) 11/29/2013  . Memory loss 11/29/2013  . Headache, paroxysmal hemicrania, episodic 11/29/2013    Past Surgical History:  Procedure Laterality Date  . ABDOMINAL AORTIC ANEURYSM REPAIR    . BACK SURGERY      lumbar  ,cervical surgeries  . CATARACT EXTRACTION W/PHACO Left 12/11/2016   Procedure: CATARACT EXTRACTION PHACO AND INTRAOCULAR LENS PLACEMENT (IOC);  Surgeon: Leandrew Koyanagi, MD;  Location: [facility] ORS;  Service: Ophthalmology;  Laterality: Left;  [place] 1:17 AP% 17.7 CDE 13.8 FLUID PACK LOT # [id number]  . ESOPHAGOGASTRODUODENOSCOPY (EGD) WITH PROPOFOL N/A 02/11/2016   Procedure: ESOPHAGOGASTRODUODENOSCOPY (EGD) WITH PROPOFOL;  Surgeon: Hulen Luster, MD;  Location: [facility] ENDOSCOPY;  Service: Gastroenterology;  Laterality: N/A;  . HERNIA REPAIR    . KIDNEY STONE SURGERY    . LUMBAR LAMINECTOMY/DECOMPRESSION MICRODISCECTOMY  12/10/2011   Procedure: LUMBAR LAMINECTOMY/DECOMPRESSION MICRODISCECTOMY;  Surgeon: Ophelia Charter;  Location: [facility] NEURO ORS;  Service: Neurosurgery;;  Thoracic One-Two Laminectomy    Allergies Ezetimibe and Statins  Social History Social History  Substance Use Topics  . Smoking status: Former Smoker    Types: Pipe  . Smokeless tobacco: Never Used  . Alcohol use Yes     Comment: occ 1-2 beers a month    Review of Systems Constitutional: Negative for fever. Cardiovascular: Negative for chest pain. Respiratory: Negative for shortness of breath. Gastrointestinal: Positive for abdominal pain Genitourinary: Negative for dysuria. Musculoskeletal: Negative for back pain. Skin: Negative for rash. Neurological: Negative for headaches, focal weakness or numbness.  10-point ROS otherwise negative.  ____________________________________________   PHYSICAL EXAM:  VITAL SIGNS: ED Triage Vitals  Enc Vitals Group  BP 12/26/16 0843 (!) 192/92     Pulse Rate 12/26/16 0843 89     Resp 12/26/16 0843 18     Temp 12/26/16 0843 97.8 F (36.6 C)     Temp Source 12/26/16 0843 Oral     SpO2 12/26/16 0843 100 %     Weight 12/26/16 0840 225 lb (102.1 kg)     Height 12/26/16 0840 5\' 8"  (1.727 m)     Head Circumference --      Peak Flow --      Pain Score 12/26/16  0841 10     Pain Loc --      Pain Edu? --      Excl. in [place]? --     Constitutional: Alert and oriented. Well appearing and in no distress. Eyes: Conjunctivae are normal. PERRL. Normal extraocular movements. ENT   Head: Normocephalic and atraumatic.   Nose: No congestion/rhinnorhea.   Mouth/Throat: Mucous membranes are moist.   Neck: No stridor. Cardiovascular: Normal rate, regular rhythm. No murmurs, rubs, or gallops. Respiratory: Normal respiratory effort without tachypnea nor retractions. Breath sounds are clear and equal bilaterally. No wheezes/rales/rhonchi. Gastrointestinal: Soft, distended and nontender. Ventral hernias nontender, high-pitched bowel sounds diffusely Musculoskeletal: Nontender with normal range of motion in all extremities. No lower extremity tenderness nor edema. Neurologic:  Normal speech and language. No gross focal neurologic deficits are appreciated.  Skin:  Skin is warm, dry and intact. No rash noted. Psychiatric: Mood and affect are normal. Speech and behavior are normal.  ____________________________________________  ED COURSE:  Pertinent labs & imaging results that were available during my care of the patient were reviewed by me and considered in my medical decision making (see chart for details). Clinical Course   Patient presents to the ER with abdominal pain of uncertain etiology. We will assess her labs and likely CT imaging.  Procedures ____________________________________________   LABS (pertinent positives/negatives)  Labs Reviewed  COMPREHENSIVE METABOLIC PANEL - Abnormal; Notable for the following:       Result Value   Sodium 130 (*)    Potassium 3.3 (*)    Chloride 91 (*)    Glucose, Bld 130 (*)    Creatinine, Ser 1.30 (*)    GFR calc non Af Amer 48 (*)    GFR calc Af Amer 56 (*)    All other components within normal limits  LIPASE, BLOOD  CBC  URINALYSIS, COMPLETE (UACMP) WITH MICROSCOPIC    RADIOLOGY Images were  viewed by me  CT of the abdomen and pelvis with contrast  IMPRESSION: Multiple ventral hernias as above, largest supraumbilical a 0.7 x 4.4 cm in size containing nonobstructed mid transverse colon.  Nonobstructed small bowel loops within to additional hernias with smaller ventral hernias containing fat.  Prostatic enlargement.  RIGHT renal atrophy.  Aortic atherosclerosis with postsurgical changes of abdominal aortic aneurysm repair ; aneurysmal dilatation common iliac arteries is seen bilaterally as above. ____________________________________________  FINAL ASSESSMENT AND PLAN  Abdominal pain  Plan: Patient with labs and imaging as dictated above. No clear etiology for the patient's abdominal pain. Possible gas pain or scar tissue related. I will prescribe a short course of tramadol to take as needed. He is stable for outpatient follow-up with his doctor.   Earleen Newport, MD   Note: This dictation was prepared with Dragon dictation. Any transcriptional errors that result from this process are unintentional    Earleen Newport, MD 12/26/16 [phone]

## 2017-01-05 ENCOUNTER — Emergency Department: Payer: PPO

## 2017-01-05 ENCOUNTER — Encounter: Payer: Self-pay | Admitting: *Deleted

## 2017-01-05 ENCOUNTER — Emergency Department
Admission: EM | Admit: 2017-01-05 | Discharge: 2017-01-05 | Disposition: A | Discharge status: Home / Self Care | Payer: PPO | Attending: Emergency Medicine | Admitting: Emergency Medicine

## 2017-01-05 DIAGNOSIS — E119 Type 2 diabetes mellitus without complications: Secondary | ICD-10-CM | POA: Insufficient documentation

## 2017-01-05 DIAGNOSIS — J449 Chronic obstructive pulmonary disease, unspecified: Secondary | ICD-10-CM | POA: Insufficient documentation

## 2017-01-05 DIAGNOSIS — I10 Essential (primary) hypertension: Secondary | ICD-10-CM | POA: Diagnosis not present

## 2017-01-05 DIAGNOSIS — Z7984 Long term (current) use of oral hypoglycemic drugs: Secondary | ICD-10-CM | POA: Insufficient documentation

## 2017-01-05 DIAGNOSIS — R109 Unspecified abdominal pain: Secondary | ICD-10-CM | POA: Diagnosis not present

## 2017-01-05 DIAGNOSIS — Z87891 Personal history of nicotine dependence: Secondary | ICD-10-CM | POA: Insufficient documentation

## 2017-01-05 DIAGNOSIS — R1011 Right upper quadrant pain: Secondary | ICD-10-CM | POA: Diagnosis not present

## 2017-01-05 LAB — COMPREHENSIVE METABOLIC PANEL
ALBUMIN: 4.3 g/dL (ref 3.5–5.0)
ALT: 22 U/L (ref 17–63)
AST: 25 U/L (ref 15–41)
Alkaline Phosphatase: 87 U/L (ref 38–126)
Anion gap: 10 (ref 5–15)
BUN: 11 mg/dL (ref 6–20)
CHLORIDE: 92 mmol/L — AB (ref 101–111)
CO2: 29 mmol/L (ref 22–32)
Calcium: 9.1 mg/dL (ref 8.9–10.3)
Creatinine, Ser: 1.43 mg/dL — ABNORMAL HIGH (ref 0.61–1.24)
GFR calc Af Amer: 50 mL/min — ABNORMAL LOW (ref >60)
GFR, EST NON AFRICAN AMERICAN: 43 mL/min — AB (ref >60)
Glucose, Bld: 152 mg/dL — ABNORMAL HIGH (ref 65–99)
POTASSIUM: 3.6 mmol/L (ref 3.5–5.1)
Sodium: 131 mmol/L — ABNORMAL LOW (ref 135–145)
Total Bilirubin: 0.7 mg/dL (ref 0.3–1.2)
Total Protein: 7.1 g/dL (ref 6.5–8.1)

## 2017-01-05 LAB — URINALYSIS, COMPLETE (UACMP) WITH MICROSCOPIC
BILIRUBIN URINE: NEGATIVE
Glucose, UA: NEGATIVE mg/dL
Hgb urine dipstick: NEGATIVE
Ketones, ur: 5 mg/dL — AB
LEUKOCYTES UA: NEGATIVE
Nitrite: NEGATIVE
PROTEIN: NEGATIVE mg/dL
Specific Gravity, Urine: 1.017 (ref 1.005–1.030)
pH: 6 (ref 5.0–8.0)

## 2017-01-05 LAB — CBC
HEMATOCRIT: 47.5 % (ref 40.0–52.0)
HEMOGLOBIN: 16 g/dL (ref 13.0–18.0)
MCH: 28.8 pg (ref 26.0–34.0)
MCHC: 33.6 g/dL (ref 32.0–36.0)
MCV: 85.8 fL (ref 80.0–100.0)
Platelets: 211 10*3/uL (ref 150–440)
RBC: 5.53 MIL/uL (ref 4.40–5.90)
RDW: 14.5 % (ref 11.5–14.5)
WBC: 6.5 10*3/uL (ref 3.8–10.6)

## 2017-01-05 LAB — LIPASE, BLOOD: LIPASE: 24 U/L (ref 11–51)

## 2017-01-05 MED ORDER — MORPHINE SULFATE (PF) 4 MG/ML IV SOLN
4.0000 mg | Freq: Once | INTRAVENOUS | Status: AC
  Administered 2017-01-05: 4 mg via INTRAVENOUS
  Filled 2017-01-05: qty 1

## 2017-01-05 MED ORDER — OXYCODONE HCL 5 MG PO TABS: 5.0000 mg | ORAL_TABLET | Freq: Four times a day (QID) | ORAL | 0 refills | Status: DC | PRN

## 2017-01-05 MED ORDER — IOPAMIDOL (ISOVUE-370) INJECTION 76%
75.0000 mL | Freq: Once | INTRAVENOUS | Status: AC | PRN
  Administered 2017-01-05: 75 mL via INTRAVENOUS
  Filled 2017-01-05: qty 75

## 2017-01-05 MED ORDER — OXYCODONE HCL 5 MG PO TABS
5.0000 mg | ORAL_TABLET | Freq: Once | ORAL | Status: AC
  Administered 2017-01-05: 5 mg via ORAL
  Filled 2017-01-05: qty 1

## 2017-01-05 NOTE — ED Triage Notes (Addendum)
States RLQ abd pain for 1 week, states now pain radiates to his generalized abd, states hernia, states he had a CT scan done last week for same complaint, states pain increases with eating

## 2017-01-05 NOTE — ED Notes (Signed)

## 2017-01-05 NOTE — ED Provider Notes (Signed)
[facility] Emergency Department Provider Note  ____________________________________________  Time seen: Approximately 6:43 PM  I have reviewed the triage vital signs and the nursing notes.   HISTORY  Chief Complaint Abdominal Pain    HPI Gary Chang is a 83 y.o. male who complains of right-sided abdominal pain for the past 2-3 weeks. It is intermittent, worse after eating. Moderate intensity, no alleviating factors. Nonradiating.  He was seen by his primary care doctor who performed an ultrasound was unremarkable. He was seen in the ED a week ago where CT scan was done with IV contrast which was unremarkable. His had ongoing pain. It's aching and sharp. Nonradiating. No chest pain shortness of breath or dizziness. No lower extremity weakness or pain. No trauma. No fever vomiting or diarrhea.     Past Medical History:  Diagnosis Date  . Abdominal aortic aneurysm (AAA) ([place])   . Allergic state   . Anxiety   . Arthritis   . Cancer ([place])    skin ca removed  . COPD (chronic obstructive pulmonary disease) ([place])   . Depression   . Diabetes mellitus    tingling in feet  . Enlarged prostate    laser surgery  . GERD (gastroesophageal reflux disease)   . H/O hiatal hernia   . High cholesterol   . Hypertension    pcp  dr Jeneen Rinks [place]  [place]  . Kidney stones   . Melanoma ([place])   . Pneumonia   . Renal artery aneurysm [facility])      Patient Active Problem List   Diagnosis Date Noted  . Tension headache 04/30/2016  . Atypical facial pain 04/30/2016  . Left-sided weakness   . Occlusion and stenosis of basilar artery   . Vertebral artery stenosis   . Basilar artery stenosis 02/28/2016  . Dizziness and giddiness 11/29/2013  . Abnormality of gait 11/29/2013  . Diabetic neuropathy ([place]) 11/29/2013  . Memory loss 11/29/2013  . Headache, paroxysmal hemicrania, episodic 11/29/2013     Past Surgical History:  Procedure Laterality Date  .  ABDOMINAL AORTIC ANEURYSM REPAIR    . BACK SURGERY     lumbar  ,cervical surgeries  . CATARACT EXTRACTION W/PHACO Left 12/11/2016   Procedure: CATARACT EXTRACTION PHACO AND INTRAOCULAR LENS PLACEMENT (IOC);  Surgeon: Leandrew Koyanagi, MD;  Location: [facility] ORS;  Service: Ophthalmology;  Laterality: Left;  [place] 1:17 AP% 17.7 CDE 13.8 FLUID PACK LOT # [id number] H  . ESOPHAGOGASTRODUODENOSCOPY (EGD) WITH PROPOFOL N/A 02/11/2016   Procedure: ESOPHAGOGASTRODUODENOSCOPY (EGD) WITH PROPOFOL;  Surgeon: Hulen Luster, MD;  Location: [facility] ENDOSCOPY;  Service: Gastroenterology;  Laterality: N/A;  . HERNIA REPAIR    . KIDNEY STONE SURGERY    . LUMBAR LAMINECTOMY/DECOMPRESSION MICRODISCECTOMY  12/10/2011   Procedure: LUMBAR LAMINECTOMY/DECOMPRESSION MICRODISCECTOMY;  Surgeon: Ophelia Charter;  Location: [facility] NEURO ORS;  Service: Neurosurgery;;  Thoracic One-Two Laminectomy     Prior to Admission medications   Medication Sig Start Date End Date Taking? Authorizing Provider  aspirin EC 81 MG tablet Take 81 mg by mouth at bedtime.      Historical Provider, MD  carbamazepine (TEGRETOL XR) 100 MG 12 hr tablet Take 100 mg by mouth 2 (two) times daily. 09/09/16   Historical Provider, MD  Cholecalciferol (VITAMIN D3 SUPER STRENGTH) 2000 UNITS TABS Take 2,000 Units by mouth daily.      Historical Provider, MD  clopidogrel (PLAVIX) 75 MG tablet Take 1 tablet (75 mg total) by mouth daily. Patient taking differently: Take 75 mg  by mouth every evening.  03/01/16   Norman Herrlich, MD  donepezil (ARICEPT) 5 MG tablet Take 5 mg by mouth every morning. 04/16/16   Historical Provider, MD  glipiZIDE (GLUCOTROL XL) 2.5 MG 24 hr tablet Take 2.5 mg by mouth at bedtime.      Historical Provider, MD  Glucos-Chondroit-Hyaluron-MSM (GLUCOSAMINE CHONDROITIN JOINT) TABS Take 2,000 mg by mouth daily.    Historical Provider, MD  hydrALAZINE (APRESOLINE) 100 MG tablet Take 100 mg by mouth 3 (three) times daily.  11/02/13   Historical Provider,  MD  hydrochlorothiazide (HYDRODIURIL) 25 MG tablet Take 25 mg by mouth every morning.     Historical Provider, MD  HYDROcodone-acetaminophen (NORCO/VICODIN) 5-325 MG per tablet Take 1 tablet by mouth 2 (two) times daily as needed for moderate pain.  10/25/13   Historical Provider, MD  LORazepam (ATIVAN) 1 MG tablet Take 1-2 mg by mouth 2 (two) times daily.    Historical Provider, MD  losartan (COZAAR) 100 MG tablet Take 100 mg by mouth every evening.     Historical Provider, MD  MAGNESIUM PO Take 600 mg by mouth every evening.    Historical Provider, MD  metoprolol succinate (TOPROL-XL) 25 MG 24 hr tablet Take 12.5 mg by mouth every morning.     Historical Provider, MD  oxyCODONE (ROXICODONE) 5 MG immediate release tablet Take 1 tablet (5 mg total) by mouth every 6 (six) hours as needed for breakthrough pain. 01/05/17   Carrie Mew, MD  potassium chloride (K-DUR) 10 MEQ tablet Take 20 mEq by mouth 2 (two) times daily. 05/05/16   Historical Provider, MD  sertraline (ZOLOFT) 100 MG tablet Take 100 mg by mouth daily.    Historical Provider, MD  tamsulosin (FLOMAX) 0.4 MG CAPS capsule Take 0.4 mg by mouth daily.     Historical Provider, MD  traMADol (ULTRAM) 50 MG tablet Take 1 tablet (50 mg total) by mouth every 6 (six) hours as needed. 12/26/16 12/26/17  Earleen Newport, MD     Allergies Ezetimibe and Statins   Family History  Problem Relation Age of Onset  . Heart Problems Mother   . Tuberculosis Father   . Anesthesia problems Neg Hx   . Hypotension Neg Hx   . Malignant hyperthermia Neg Hx   . Pseudochol deficiency Neg Hx     Social History Social History  Substance Use Topics  . Smoking status: Former Smoker    Types: Pipe  . Smokeless tobacco: Never Used  . Alcohol use Yes     Comment: occ 1-2 beers a month    Review of Systems  Constitutional:   No fever or chills.  ENT:   No sore throat. No rhinorrhea. Cardiovascular:   No chest pain. Respiratory:   No dyspnea or  cough. Gastrointestinal:   Abdominal pain as above without vomiting and diarrhea.  Genitourinary:   Negative for dysuria or difficulty urinating. Musculoskeletal:   Negative for focal pain or swelling Neurological:   Negative for headaches 10-point ROS otherwise negative.  ____________________________________________   PHYSICAL EXAM:  VITAL SIGNS: ED Triage Vitals  Enc Vitals Group     BP 01/05/17 1119 (!) 157/75     Pulse Rate 01/05/17 1119 87     Resp 01/05/17 1119 18     Temp 01/05/17 1119 97.7 F (36.5 C)     Temp Source 01/05/17 1119 Oral     SpO2 01/05/17 1119 97 %     Weight 01/05/17 1120 225 lb (102.1 kg)  Height 01/05/17 1120 5\' 8"  (1.727 m)     Head Circumference --      Peak Flow --      Pain Score 01/05/17 1120 10     Pain Loc --      Pain Edu? --      Excl. in [place]? --     Vital signs reviewed, nursing assessments reviewed.   Constitutional:   Alert and oriented. Well appearing and in no distress. Eyes:   No scleral icterus. No conjunctival pallor. PERRL. EOMI.  No nystagmus. ENT   Head:   Normocephalic and atraumatic.   Nose:   No congestion/rhinnorhea. No septal hematoma   Mouth/Throat:   MMM, no pharyngeal erythema. No peritonsillar mass.    Neck:   No stridor. No SubQ emphysema. No meningismus. Hematological/Lymphatic/Immunilogical:   No cervical lymphadenopathy. Cardiovascular:   RRR. Symmetric bilateral radial and DP pulses.  No murmurs.  Respiratory:   Normal respiratory effort without tachypnea nor retractions. Breath sounds are clear and equal bilaterally. No wheezes/rales/rhonchi. Gastrointestinal:   Soft with tenderness overlying palpation of the right abdominal wall laterally in the area of indicated pain. This is just lateral of his very large ventral hernia. No significant tenderness on deep palpation of the abdomen Genitourinary:   deferred Musculoskeletal:   Nontender with normal range of motion in all extremities. No joint  effusions.  No lower extremity tenderness.  No edema. Neurologic:   Normal speech and language.  CN 2-10 normal. Motor grossly intact. No gross focal neurologic deficits are appreciated.  Skin:    Skin is warm, dry and intact. No rash noted.  No petechiae, purpura, or bullae.  ____________________________________________    LABS (pertinent positives/negatives) (all labs ordered are listed, but only abnormal results are displayed) Labs Reviewed  COMPREHENSIVE METABOLIC PANEL - Abnormal; Notable for the following:       Result Value   Sodium 131 (*)    Chloride 92 (*)    Glucose, Bld 152 (*)    Creatinine, Ser 1.43 (*)    GFR calc non Af Amer 43 (*)    GFR calc Af Amer 50 (*)    All other components within normal limits  URINALYSIS, COMPLETE (UACMP) WITH MICROSCOPIC - Abnormal; Notable for the following:    Color, Urine YELLOW (*)    APPearance CLEAR (*)    Ketones, ur 5 (*)    Bacteria, UA RARE (*)    Squamous Epithelial / LPF 0-5 (*)    All other components within normal limits  LIPASE, BLOOD  CBC   ____________________________________________   EKG    ____________________________________________    RADIOLOGY  Ultrasound right upper quadrant unremarkable Ct angiogram abdomen pelvis stable. No changes from previous, patent arteries. No explanation for symptoms.  ____________________________________________   PROCEDURES Procedures  ____________________________________________   INITIAL IMPRESSION / ASSESSMENT AND PLAN / ED COURSE  Pertinent labs & imaging results that were available during my care of the patient were reviewed by me and considered in my medical decision making (see chart for details).  Patient well appearing no acute distress, unremarkable vital signs. Initially hypertensive which improved with pain control. Due to age and comorbidities including his history of surgical aorta repair, ultrasound was completed which was negative for any  biliary process. He is not tender in the area of the gallbladder febrile or have a leukocytosis, so don't think that requires further workup at this time.  Proceeded with a CT angiogram of the abdomen to evaluate for  mesenteric ischemia. This is negative, and the angiogram is stable and unchanged from previous. We'll continue to control pain, follow up closely with primary care for further monitoring of symptoms.  Considering the patient's symptoms, medical history, and physical examination today, I have low suspicion for cholecystitis or biliary pathology, pancreatitis, perforation or bowel obstruction, hernia, intra-abdominal abscess, AAA or dissection or leak, volvulus or intussusception, mesenteric ischemia, or appendicitis.  Due to the tenderness with palpation of the abdominal wall just laterally on the right from the ventral hernia, highest suspicion at this point is that this is related to some abdominal wall strain or tear from the stresses of the very large hernia.     Clinical Course    ____________________________________________   FINAL CLINICAL IMPRESSION(S) / ED DIAGNOSES  Final diagnoses:  Abdominal pain, unspecified abdominal location      New Prescriptions   OXYCODONE (ROXICODONE) 5 MG IMMEDIATE RELEASE TABLET    Take 1 tablet (5 mg total) by mouth every 6 (six) hours as needed for breakthrough pain.     Portions of this note were generated with dragon dictation software. Dictation errors may occur despite best attempts at proofreading.    Carrie Mew, MD 01/05/17 [phone]

## 2017-01-12 DIAGNOSIS — Z79899 Other long term (current) drug therapy: Secondary | ICD-10-CM | POA: Diagnosis not present

## 2017-01-12 DIAGNOSIS — G8929 Other chronic pain: Secondary | ICD-10-CM | POA: Diagnosis not present

## 2017-01-12 DIAGNOSIS — R51 Headache: Secondary | ICD-10-CM | POA: Diagnosis not present

## 2017-01-12 DIAGNOSIS — M542 Cervicalgia: Secondary | ICD-10-CM | POA: Diagnosis not present

## 2017-01-12 DIAGNOSIS — R109 Unspecified abdominal pain: Secondary | ICD-10-CM | POA: Diagnosis not present

## 2017-01-13 DIAGNOSIS — R1031 Right lower quadrant pain: Secondary | ICD-10-CM | POA: Diagnosis not present

## 2017-01-13 DIAGNOSIS — R634 Abnormal weight loss: Secondary | ICD-10-CM | POA: Diagnosis not present

## 2017-01-19 ENCOUNTER — Encounter: Payer: Self-pay | Admitting: *Deleted

## 2017-01-19 DIAGNOSIS — Z961 Presence of intraocular lens: Secondary | ICD-10-CM | POA: Diagnosis not present

## 2017-01-27 DIAGNOSIS — L820 Inflamed seborrheic keratosis: Secondary | ICD-10-CM | POA: Diagnosis not present

## 2017-01-27 DIAGNOSIS — L578 Other skin changes due to chronic exposure to nonionizing radiation: Secondary | ICD-10-CM | POA: Diagnosis not present

## 2017-01-27 DIAGNOSIS — L570 Actinic keratosis: Secondary | ICD-10-CM | POA: Diagnosis not present

## 2017-01-27 DIAGNOSIS — L821 Other seborrheic keratosis: Secondary | ICD-10-CM | POA: Diagnosis not present

## 2017-01-29 ENCOUNTER — Ambulatory Visit (INDEPENDENT_AMBULATORY_CARE_PROVIDER_SITE_OTHER): Payer: PPO | Admitting: General Surgery

## 2017-01-29 ENCOUNTER — Encounter: Payer: Self-pay | Admitting: General Surgery

## 2017-01-29 VITALS — BP 120/70 | HR 103 | Resp 14 | Ht 68.0 in | Wt 217.0 lb

## 2017-01-29 DIAGNOSIS — K439 Ventral hernia without obstruction or gangrene: Secondary | ICD-10-CM

## 2017-01-29 DIAGNOSIS — R109 Unspecified abdominal pain: Secondary | ICD-10-CM

## 2017-01-29 NOTE — Progress Notes (Signed)
Patient ID: Gary Chang, male   DOB: 12/15/1928, 83 y.o.   MRN: [id number]  Chief Complaint  Patient presents with  . Abdominal Pain    HPI Gary Chang is a 83 y.o. male here today for evaluation of abdominal pain. Patient states he have been having pain in his right lower quadrant, this has been going on for about six weeks ago. He states the pain is sharp and stabling and last for a few seconds.Ct scan was done on 12/26/2016 and ultrasound was done on 01/05/2017.He states he don't want to eat much no more.  Wife, Aurelio Jew daughter, Ladona Ridgel, present at visit.   HPI  Past Medical History:  Diagnosis Date  . Abdominal aortic aneurysm (AAA) ([place])   . Allergic state   . Anxiety   . Arthritis   . Cancer ([place])    skin ca removed  . COPD (chronic obstructive pulmonary disease) ([place])   . Depression   . Diabetes mellitus    tingling in feet  . Enlarged prostate    laser surgery  . GERD (gastroesophageal reflux disease)   . H/O hiatal hernia   . High cholesterol   . Hypertension    pcp  dr Jeneen Rinks [place]  [place]  . Kidney stones   . Melanoma ([place])   . Pneumonia   . Renal artery aneurysm [facility])     Past Surgical History:  Procedure Laterality Date  . ABDOMINAL AORTIC ANEURYSM REPAIR    . BACK SURGERY     lumbar  ,cervical surgeries  . CATARACT EXTRACTION W/PHACO Left 12/11/2016   Procedure: CATARACT EXTRACTION PHACO AND INTRAOCULAR LENS PLACEMENT (IOC);  Surgeon: Leandrew Koyanagi, MD;  Location: [facility] ORS;  Service: Ophthalmology;  Laterality: Left;  [place] 1:17 AP% 17.7 CDE 13.8 FLUID PACK LOT # [id number]  . ESOPHAGOGASTRODUODENOSCOPY (EGD) WITH PROPOFOL N/A 02/11/2016   Procedure: ESOPHAGOGASTRODUODENOSCOPY (EGD) WITH PROPOFOL;  Surgeon: Hulen Luster, MD;  Location: [facility] ENDOSCOPY;  Service: Gastroenterology;  Laterality: N/A;  . HERNIA REPAIR    . KIDNEY STONE SURGERY    . LUMBAR LAMINECTOMY/DECOMPRESSION MICRODISCECTOMY  12/10/2011   Procedure: LUMBAR  LAMINECTOMY/DECOMPRESSION MICRODISCECTOMY;  Surgeon: Ophelia Charter;  Location: [facility] NEURO ORS;  Service: Neurosurgery;;  Thoracic One-Two Laminectomy    Family History  Problem Relation Age of Onset  . Heart Problems Mother   . Tuberculosis Father   . Anesthesia problems Neg Hx   . Hypotension Neg Hx   . Malignant hyperthermia Neg Hx   . Pseudochol deficiency Neg Hx     Social History Social History  Substance Use Topics  . Smoking status: Former Smoker    Types: Pipe  . Smokeless tobacco: Never Used  . Alcohol use Yes     Comment: occ 1-2 beers a month    Allergies  Allergen Reactions  . Ezetimibe Other (See Comments)    "Numbness and tingling"  . Statins Other (See Comments)    "Numbness and tingling"    Current Outpatient Prescriptions  Medication Sig Dispense Refill  . aspirin EC 81 MG tablet Take 81 mg by mouth at bedtime.      . carbamazepine (TEGRETOL XR) 100 MG 12 hr tablet Take 100 mg by mouth 2 (two) times daily.    . Cholecalciferol (VITAMIN D3 SUPER STRENGTH) 2000 UNITS TABS Take 2,000 Units by mouth daily.      . clopidogrel (PLAVIX) 75 MG tablet Take 1 tablet (75 mg total) by mouth daily. (Patient taking differently: Take 75  mg by mouth every evening. ) 30 tablet 1  . donepezil (ARICEPT) 5 MG tablet Take 5 mg by mouth every morning.    Marland Kitchen glipiZIDE (GLUCOTROL XL) 2.5 MG 24 hr tablet Take 2.5 mg by mouth at bedtime.      . Glucos-Chondroit-Hyaluron-MSM (GLUCOSAMINE CHONDROITIN JOINT) TABS Take 2,000 mg by mouth daily.    . hydrALAZINE (APRESOLINE) 100 MG tablet Take 100 mg by mouth 3 (three) times daily.     . hydrochlorothiazide (HYDRODIURIL) 25 MG tablet Take 25 mg by mouth every morning.     Marland Kitchen HYDROcodone-acetaminophen (NORCO/VICODIN) 5-325 MG per tablet Take 1 tablet by mouth 2 (two) times daily as needed for moderate pain.     Marland Kitchen LORazepam (ATIVAN) 1 MG tablet Take 1-2 mg by mouth 2 (two) times daily.    Marland Kitchen losartan (COZAAR) 100 MG tablet Take 100 mg by  mouth every evening.     Marland Kitchen MAGNESIUM PO Take 600 mg by mouth every evening.    . metoprolol succinate (TOPROL-XL) 25 MG 24 hr tablet Take 12.5 mg by mouth every morning.     Marland Kitchen oxyCODONE (ROXICODONE) 5 MG immediate release tablet Take 1 tablet (5 mg total) by mouth every 6 (six) hours as needed for breakthrough pain. 12 tablet 0  . potassium chloride (K-DUR) 10 MEQ tablet Take 20 mEq by mouth 2 (two) times daily.    . sertraline (ZOLOFT) 100 MG tablet Take 100 mg by mouth daily.    . tamsulosin (FLOMAX) 0.4 MG CAPS capsule Take 0.4 mg by mouth daily.     . traMADol (ULTRAM) 50 MG tablet Take 1 tablet (50 mg total) by mouth every 6 (six) hours as needed. 20 tablet 0   No current facility-administered medications for this visit.     Review of Systems Review of Systems  Constitutional: Positive for appetite change.  Respiratory: Negative.   Cardiovascular: Negative.   Gastrointestinal: Positive for abdominal pain.    Blood pressure 120/70, pulse (!) 103, resp. rate 14, height 5\' 8"  (1.727 m), weight 217 lb (98.4 kg).  Physical Exam Physical Exam  Constitutional: He is oriented to person, place, and time. He appears well-developed and well-nourished.  Eyes: Conjunctivae are normal. No scleral icterus.  Neck: Neck supple.  Cardiovascular: Normal rate, regular rhythm and normal heart sounds.   Pulmonary/Chest: Effort normal and breath sounds normal.  Abdominal: Soft. Normal appearance and bowel sounds are normal. There is no hepatomegaly. There is tenderness ( T11 on the right, without radiation to the midline. Maximum anterior axillary line.). A hernia is present. Hernia confirmed positive in the ventral area ( 6 mm ventral hernia ). Hernia confirmed negative in the right inguinal area and confirmed negative in the left inguinal area.    Lymphadenopathy:    He has no cervical adenopathy.  Neurological: He is alert and oriented to person, place, and time.  Skin: Skin is warm and dry.     Data Reviewed Abdominal ultrasound dated 01/05/2017: No evidence cholelithiasis.  CT scan of 12/26/2016 obtained to the emergency department reviewed. Multiple ventral hernias. Small fat-containing right side hernia. The appendix was well visualized.   CT angiogram completed 01/05/2017 showed no vascular compromise, bilateral iliac artery aneurysmal dilatation.  Records from [place], [place] [place] [facility] dated 01/13/2017 reviewed.  Laboratory studies dated 12/18/2016 showed a creatinine of 1.5 with an estimated GFR 44, normal liver function studies. Hemoglobin 15.2 with an MCV of 90, white blood cell count 7100, platelet count of 168,000.  Assessment    Unexplained abdominal pain.  Multiple ventral hernias.  Clinically undetectable right inguinal hernia.    Plan    I cannot identify a source for the patient's pain based on clinical exam or extensive review of previous evaluations and radiologic imaging. The patient's ventral hernias have been present essentially since his aneurysm repair 6 years ago, are always reducible and showed no focal tenderness. The patient is not a candidate for a component separation repair of his hernias.  The radiologically identified umbilical hernia is not symptomatic in regards to tenderness, and high would not recommend elective repair.  No evidence of biliary tract disease.  No clinical history does suggest colonic source.  The patient reports pain, and demonstrated grade instability with change in position. All I understand he has shown evidence of vertebrobasilar atherosclerosis, ongoing management with Plavix and the patient's increased risks of fall might suggest an unfavorable risk, benefit ratio in regards to anti-platelet therapy.   Patient to return as needed. This information has been scribed by Gaspar Cola CMA.   Robert Bellow 01/30/2017, 4:57 PM

## 2017-01-29 NOTE — Patient Instructions (Signed)
Return as needed

## 2017-01-30 DIAGNOSIS — K439 Ventral hernia without obstruction or gangrene: Secondary | ICD-10-CM | POA: Insufficient documentation

## 2017-01-30 DIAGNOSIS — R109 Unspecified abdominal pain: Secondary | ICD-10-CM | POA: Insufficient documentation

## 2017-02-02 DIAGNOSIS — F5104 Psychophysiologic insomnia: Secondary | ICD-10-CM | POA: Diagnosis not present

## 2017-02-02 DIAGNOSIS — I10 Essential (primary) hypertension: Secondary | ICD-10-CM | POA: Diagnosis not present

## 2017-02-02 DIAGNOSIS — E119 Type 2 diabetes mellitus without complications: Secondary | ICD-10-CM | POA: Diagnosis not present

## 2017-02-02 DIAGNOSIS — K9289 Other specified diseases of the digestive system: Secondary | ICD-10-CM | POA: Diagnosis not present

## 2017-02-06 DIAGNOSIS — D2311 Other benign neoplasm of skin of right eyelid, including canthus: Secondary | ICD-10-CM | POA: Diagnosis not present

## 2017-02-06 DIAGNOSIS — L821 Other seborrheic keratosis: Secondary | ICD-10-CM | POA: Diagnosis not present

## 2017-03-10 DIAGNOSIS — E119 Type 2 diabetes mellitus without complications: Secondary | ICD-10-CM | POA: Diagnosis not present

## 2017-03-10 DIAGNOSIS — M542 Cervicalgia: Secondary | ICD-10-CM | POA: Diagnosis not present

## 2017-03-10 DIAGNOSIS — R51 Headache: Secondary | ICD-10-CM | POA: Diagnosis not present

## 2017-03-10 DIAGNOSIS — G8929 Other chronic pain: Secondary | ICD-10-CM | POA: Diagnosis not present

## 2017-03-10 DIAGNOSIS — I10 Essential (primary) hypertension: Secondary | ICD-10-CM | POA: Diagnosis not present

## 2017-04-21 DIAGNOSIS — I10 Essential (primary) hypertension: Secondary | ICD-10-CM | POA: Diagnosis not present

## 2017-04-21 DIAGNOSIS — F419 Anxiety disorder, unspecified: Secondary | ICD-10-CM | POA: Diagnosis not present

## 2017-04-21 DIAGNOSIS — E119 Type 2 diabetes mellitus without complications: Secondary | ICD-10-CM | POA: Diagnosis not present

## 2017-04-21 DIAGNOSIS — F329 Major depressive disorder, single episode, unspecified: Secondary | ICD-10-CM | POA: Diagnosis not present

## 2017-05-13 DIAGNOSIS — L821 Other seborrheic keratosis: Secondary | ICD-10-CM | POA: Diagnosis not present

## 2017-05-13 DIAGNOSIS — Z85828 Personal history of other malignant neoplasm of skin: Secondary | ICD-10-CM | POA: Diagnosis not present

## 2017-05-13 DIAGNOSIS — Z1283 Encounter for screening for malignant neoplasm of skin: Secondary | ICD-10-CM | POA: Diagnosis not present

## 2017-05-13 DIAGNOSIS — C44619 Basal cell carcinoma of skin of left upper limb, including shoulder: Secondary | ICD-10-CM | POA: Diagnosis not present

## 2017-05-13 DIAGNOSIS — L578 Other skin changes due to chronic exposure to nonionizing radiation: Secondary | ICD-10-CM | POA: Diagnosis not present

## 2017-05-13 DIAGNOSIS — L570 Actinic keratosis: Secondary | ICD-10-CM | POA: Diagnosis not present

## 2017-05-13 DIAGNOSIS — L820 Inflamed seborrheic keratosis: Secondary | ICD-10-CM | POA: Diagnosis not present

## 2017-05-13 DIAGNOSIS — D485 Neoplasm of uncertain behavior of skin: Secondary | ICD-10-CM | POA: Diagnosis not present

## 2017-05-19 DIAGNOSIS — R350 Frequency of micturition: Secondary | ICD-10-CM | POA: Diagnosis not present

## 2017-05-19 DIAGNOSIS — R3916 Straining to void: Secondary | ICD-10-CM | POA: Diagnosis not present

## 2017-05-19 DIAGNOSIS — R3915 Urgency of urination: Secondary | ICD-10-CM | POA: Diagnosis not present

## 2017-05-19 DIAGNOSIS — N401 Enlarged prostate with lower urinary tract symptoms: Secondary | ICD-10-CM | POA: Diagnosis not present

## 2017-05-19 DIAGNOSIS — R3914 Feeling of incomplete bladder emptying: Secondary | ICD-10-CM | POA: Diagnosis not present

## 2017-05-19 DIAGNOSIS — R351 Nocturia: Secondary | ICD-10-CM | POA: Diagnosis not present

## 2017-05-26 DIAGNOSIS — R3914 Feeling of incomplete bladder emptying: Secondary | ICD-10-CM | POA: Diagnosis not present

## 2017-05-26 DIAGNOSIS — R3915 Urgency of urination: Secondary | ICD-10-CM | POA: Diagnosis not present

## 2017-05-26 DIAGNOSIS — N401 Enlarged prostate with lower urinary tract symptoms: Secondary | ICD-10-CM | POA: Diagnosis not present

## 2017-05-26 DIAGNOSIS — R350 Frequency of micturition: Secondary | ICD-10-CM | POA: Diagnosis not present

## 2017-06-02 DIAGNOSIS — H903 Sensorineural hearing loss, bilateral: Secondary | ICD-10-CM | POA: Diagnosis not present

## 2017-06-02 DIAGNOSIS — R350 Frequency of micturition: Secondary | ICD-10-CM | POA: Diagnosis not present

## 2017-06-02 DIAGNOSIS — H6121 Impacted cerumen, right ear: Secondary | ICD-10-CM | POA: Diagnosis not present

## 2017-06-02 DIAGNOSIS — R3914 Feeling of incomplete bladder emptying: Secondary | ICD-10-CM | POA: Diagnosis not present

## 2017-06-02 DIAGNOSIS — N401 Enlarged prostate with lower urinary tract symptoms: Secondary | ICD-10-CM | POA: Diagnosis not present

## 2017-06-02 DIAGNOSIS — R3915 Urgency of urination: Secondary | ICD-10-CM | POA: Diagnosis not present

## 2017-06-11 DIAGNOSIS — R350 Frequency of micturition: Secondary | ICD-10-CM | POA: Diagnosis not present

## 2017-06-11 DIAGNOSIS — N401 Enlarged prostate with lower urinary tract symptoms: Secondary | ICD-10-CM | POA: Diagnosis not present

## 2017-06-11 DIAGNOSIS — R3914 Feeling of incomplete bladder emptying: Secondary | ICD-10-CM | POA: Diagnosis not present

## 2017-06-16 DIAGNOSIS — R51 Headache: Secondary | ICD-10-CM | POA: Diagnosis not present

## 2017-06-16 DIAGNOSIS — Z01818 Encounter for other preprocedural examination: Secondary | ICD-10-CM | POA: Diagnosis not present

## 2017-06-16 DIAGNOSIS — M542 Cervicalgia: Secondary | ICD-10-CM | POA: Diagnosis not present

## 2017-06-16 DIAGNOSIS — G8929 Other chronic pain: Secondary | ICD-10-CM | POA: Diagnosis not present

## 2017-06-17 DIAGNOSIS — L821 Other seborrheic keratosis: Secondary | ICD-10-CM | POA: Diagnosis not present

## 2017-06-17 DIAGNOSIS — L820 Inflamed seborrheic keratosis: Secondary | ICD-10-CM | POA: Diagnosis not present

## 2017-06-17 DIAGNOSIS — C44619 Basal cell carcinoma of skin of left upper limb, including shoulder: Secondary | ICD-10-CM | POA: Diagnosis not present

## 2017-06-17 DIAGNOSIS — L570 Actinic keratosis: Secondary | ICD-10-CM | POA: Diagnosis not present

## 2017-06-17 DIAGNOSIS — L578 Other skin changes due to chronic exposure to nonionizing radiation: Secondary | ICD-10-CM | POA: Diagnosis not present

## 2017-06-22 DIAGNOSIS — E876 Hypokalemia: Secondary | ICD-10-CM | POA: Diagnosis not present

## 2017-06-30 DIAGNOSIS — E876 Hypokalemia: Secondary | ICD-10-CM | POA: Diagnosis not present

## 2017-07-02 DIAGNOSIS — E876 Hypokalemia: Secondary | ICD-10-CM | POA: Diagnosis not present

## 2017-07-03 ENCOUNTER — Encounter (INDEPENDENT_AMBULATORY_CARE_PROVIDER_SITE_OTHER): Payer: PPO

## 2017-07-03 ENCOUNTER — Ambulatory Visit (INDEPENDENT_AMBULATORY_CARE_PROVIDER_SITE_OTHER): Payer: PPO | Admitting: Vascular Surgery

## 2017-07-09 ENCOUNTER — Encounter
Admission: RE | Admit: 2017-07-09 | Discharge: 2017-07-09 | Disposition: A | Discharge status: Home / Self Care | Payer: PPO | Source: Ambulatory Visit | Attending: Urology | Admitting: Urology

## 2017-07-09 DIAGNOSIS — Z01810 Encounter for preprocedural cardiovascular examination: Secondary | ICD-10-CM | POA: Diagnosis not present

## 2017-07-09 DIAGNOSIS — I4892 Unspecified atrial flutter: Secondary | ICD-10-CM | POA: Insufficient documentation

## 2017-07-09 DIAGNOSIS — Z01812 Encounter for preprocedural laboratory examination: Secondary | ICD-10-CM | POA: Diagnosis not present

## 2017-07-09 LAB — BASIC METABOLIC PANEL
ANION GAP: 9 (ref 5–15)
BUN: 15 mg/dL (ref 6–20)
CHLORIDE: 93 mmol/L — AB (ref 101–111)
CO2: 29 mmol/L (ref 22–32)
Calcium: 9 mg/dL (ref 8.9–10.3)
Creatinine, Ser: 1.3 mg/dL — ABNORMAL HIGH (ref 0.61–1.24)
GFR calc non Af Amer: 48 mL/min — ABNORMAL LOW (ref >60)
GFR, EST AFRICAN AMERICAN: 56 mL/min — AB (ref >60)
Glucose, Bld: 130 mg/dL — ABNORMAL HIGH (ref 65–99)
POTASSIUM: 3.5 mmol/L (ref 3.5–5.1)
SODIUM: 131 mmol/L — AB (ref 135–145)

## 2017-07-09 NOTE — Patient Instructions (Signed)
  Your procedure is scheduled on: Tues. 07/14/17 Report to Day Surgery. To find out your arrival time please call [phone] between 1PM - 3PM on Mon. 07/13/17.  Remember: Instructions that are not followed completely may result in serious medical risk, up to and including death, or upon the discretion of your surgeon and anesthesiologist your surgery may need to be rescheduled.    __x__ 1. Do not eat food or drink liquids after midnight. No gum chewing or hard candies.     __x__ 2. No Alcohol for 24 hours before or after surgery.   ____ 3. Do Not Smoke For 24 Hours Prior to Your Surgery.   ____ 4. Bring all medications with you on the day of surgery if instructed.    ___x_ 5. Notify your doctor if there is any change in your medical condition     (cold, fever, infections).       Do not wear jewelry, make-up, hairpins, clips or nail polish.  Do not wear lotions, powders, or perfumes. You may wear deodorant.  Do not shave 48 hours prior to surgery. Men may shave face and neck.  Do not bring valuables to the hospital.    [facility] is not responsible for any belongings or valuables.               Contacts, dentures or bridgework may not be worn into surgery.  Leave your suitcase in the car. After surgery it may be brought to your room.  For patients admitted to the hospital, discharge time is determined by your                treatment team.   Patients discharged the day of surgery will not be allowed to drive home.   Please read over the following fact sheets that you were given:      _x___ Take these medicines the morning of surgery with A SIP OF WATER:    1. carbamazepine (TEGRETOL XR) 100 MG 12 hr tablet  2. donepezil (ARICEPT) 5 MG tablet  3. hydrALAZINE (APRESOLINE) 100 MG tablet  4.HYDROcodone-acetaminophen (NORCO/VICODIN) 5-325 MG per tablet if needed  5.metoprolol succinate (TOPROL-XL) 25 MG 24 hr tablet  6.sertraline (ZOLOFT) 100 MG tablet  ____ Fleet Enema (as  directed)   ____ Use CHG Soap as directed  ____ Use inhalers on the day of surgery  ____ Stop metformin 2 days prior to surgery    ____ Take 1/2 of usual insulin dose the night before surgery and none on the morning of surgery.   _x___ Stop Plavix on today  ____ Stop Anti-inflammatories on    __x__ Stop supplements until after surgery.  GLUCOSAMINE-CHONDROITIN PO  ____ Bring C-Pap to the hospital.

## 2017-07-09 NOTE — H&P (Signed)
NAME:  Gary Chang, Gary Chang                 ACCOUNT NO.:  MEDICAL RECORD NO.:  [id number]  LOCATION:                                 FACILITY:  PHYSICIAN:  Maryan Puls          DATE OF BIRTH:  1928-06-24  DATE OF ADMISSION: DATE OF DISCHARGE:                            HISTORY AND PHYSICAL   DATE OF SURGERY:  July 14, 2017.  CHIEF COMPLAINT:  Difficulty voiding.  HISTORY OF PRESENT ILLNESS:  Gary Chang is an 83 year old white male with a long history of difficulty voiding.  His current symptoms include incomplete emptying frequency, intermittency, urgency, weak stream, and nocturia.  AUA symptom score was 22 with a quality of life score of 6. Evaluation in the office included a PSA of 1.6 ng/mL.  Uroflow revealed a maximum flow rate of 3 mL/sec with average flow of 2 mL/second. Cystoscopy revealed bladder neck contracture of approximately 20-French in size and regrowth of prostatic tissue in the region of the bladder neck and median lobe.  The patient comes in now for photovaporization of prostate with GreenLight laser.  ALLERGIES:  NO DRUG ALLERGIES.  CURRENT MEDICATIONS: 1. Aspirin. 2. Tegretol. 3. Clopidogrel. 4. Aricept. 5. Neurontin. 6. Glucotrol. 7. Hydralazine. 8. HydroDIURIL. 9. Norco. 10.Cozaar. 11.Magnesium oxide. 12.Metoprolol. 13.Potassium chloride. 14.Vitamin B complex. 15.Vitamin D3. 16.Rapaflo.  PAST SURGICAL HISTORY: 1. Left nephrolithotomy in 1978. 2. Left inguinal herniorrhaphy in 1970. 3. Lumbar laminectomy in 1990. 4. Cervical laminectomy in 2003. 5. Abdominal aortic aneurysm repair in 2008.  SOCIAL HISTORY:  The patient denied tobacco use.  He quit smoking in 1970 with a 20 pack-year history.  He denied alcohol use.  FAMILY HISTORY:  Mother died of heart disease at age 23.  Father died of tuberculosis at age 65.  PAST AND CURRENT MEDICAL CONDITIONS: 1. Hypertension. 2. GERD. 3. Hypercholesterolemia. 4. Depression with anxiety. 5.  Peripheral neuropathy. 6. Migraine headaches. 7. Chronic anemia. 8. Diabetes.  REVIEW OF SYSTEMS:  Decreased auditory acuity. Chronic joint pain. Insomnia.The patient denied chest pain or shortness of breath.  PHYSICAL EXAMINATION:  VITAL SIGNS:  Height 5 feet 8 inches, weight 222, BMI 32. GENERAL:  Obese white male in no acute distress. HEENT:  Sclerae were clear. NECK:  Supple.  No palpable cervical adenopathy.  No audible carotid bruits. LUNGS:  Clear to auscultation. CARDIOVASCULAR:  Regular rhythm and rate without audible murmurs. ABDOMEN:  Soft, nontender abdomen. GENITOURINARY:  Uncircumcised testes, smooth, nontender 20 mL in size each. RECTAL:  40 g, smooth, nontender prostate. NEUROMUSCULAR:  Alert and oriented x3.  IMPRESSION: 1. Benign prostatic hypertrophy with bladder outlet obstruction. 2. Bladder neck contracture.  PLAN: 1. Photo vaporization of prostate with GreenLight laser. 2. Medical clearance for this procedure was obtained via Dr. [name] on July 02, 2017.          ______________________________ Maryan Puls    MW/MEDQ  D:  07/09/2017  T:  07/09/2017  Job:  [id number]

## 2017-07-13 MED ORDER — CEFAZOLIN SODIUM-DEXTROSE 1-4 GM/50ML-% IV SOLN
1.0000 g | Freq: Once | INTRAVENOUS | Status: AC
  Administered 2017-07-14: 1 g via INTRAVENOUS

## 2017-07-14 ENCOUNTER — Ambulatory Visit: Payer: PPO | Admitting: Anesthesiology

## 2017-07-14 ENCOUNTER — Ambulatory Visit
Admission: RE | Admit: 2017-07-14 | Discharge: 2017-07-14 | Disposition: A | Discharge status: Home / Self Care | Payer: PPO | Source: Ambulatory Visit | Attending: Urology | Admitting: Urology

## 2017-07-14 ENCOUNTER — Encounter
Admission: RE | Disposition: A | Discharge status: Home / Self Care | Payer: Self-pay | Source: Ambulatory Visit | Attending: Urology

## 2017-07-14 DIAGNOSIS — N320 Bladder-neck obstruction: Secondary | ICD-10-CM | POA: Insufficient documentation

## 2017-07-14 DIAGNOSIS — N138 Other obstructive and reflux uropathy: Secondary | ICD-10-CM | POA: Diagnosis not present

## 2017-07-14 DIAGNOSIS — R351 Nocturia: Secondary | ICD-10-CM | POA: Insufficient documentation

## 2017-07-14 DIAGNOSIS — R3912 Poor urinary stream: Secondary | ICD-10-CM | POA: Diagnosis not present

## 2017-07-14 DIAGNOSIS — I10 Essential (primary) hypertension: Secondary | ICD-10-CM | POA: Diagnosis not present

## 2017-07-14 DIAGNOSIS — E1151 Type 2 diabetes mellitus with diabetic peripheral angiopathy without gangrene: Secondary | ICD-10-CM | POA: Insufficient documentation

## 2017-07-14 DIAGNOSIS — Z7984 Long term (current) use of oral hypoglycemic drugs: Secondary | ICD-10-CM | POA: Diagnosis not present

## 2017-07-14 DIAGNOSIS — R39198 Other difficulties with micturition: Secondary | ICD-10-CM | POA: Diagnosis present

## 2017-07-14 DIAGNOSIS — Z87891 Personal history of nicotine dependence: Secondary | ICD-10-CM | POA: Insufficient documentation

## 2017-07-14 DIAGNOSIS — N401 Enlarged prostate with lower urinary tract symptoms: Secondary | ICD-10-CM | POA: Diagnosis not present

## 2017-07-14 DIAGNOSIS — J449 Chronic obstructive pulmonary disease, unspecified: Secondary | ICD-10-CM | POA: Diagnosis not present

## 2017-07-14 DIAGNOSIS — Z7982 Long term (current) use of aspirin: Secondary | ICD-10-CM | POA: Insufficient documentation

## 2017-07-14 DIAGNOSIS — F418 Other specified anxiety disorders: Secondary | ICD-10-CM | POA: Diagnosis not present

## 2017-07-14 DIAGNOSIS — Z79899 Other long term (current) drug therapy: Secondary | ICD-10-CM | POA: Insufficient documentation

## 2017-07-14 DIAGNOSIS — Z7902 Long term (current) use of antithrombotics/antiplatelets: Secondary | ICD-10-CM | POA: Insufficient documentation

## 2017-07-14 DIAGNOSIS — Z79891 Long term (current) use of opiate analgesic: Secondary | ICD-10-CM | POA: Diagnosis not present

## 2017-07-14 DIAGNOSIS — E1142 Type 2 diabetes mellitus with diabetic polyneuropathy: Secondary | ICD-10-CM | POA: Diagnosis not present

## 2017-07-14 DIAGNOSIS — N400 Enlarged prostate without lower urinary tract symptoms: Secondary | ICD-10-CM | POA: Diagnosis not present

## 2017-07-14 DIAGNOSIS — R3914 Feeling of incomplete bladder emptying: Secondary | ICD-10-CM | POA: Insufficient documentation

## 2017-07-14 DIAGNOSIS — R3915 Urgency of urination: Secondary | ICD-10-CM | POA: Insufficient documentation

## 2017-07-14 HISTORY — PX: GREEN LIGHT LASER TURP (TRANSURETHRAL RESECTION OF PROSTATE: SHX6260

## 2017-07-14 LAB — GLUCOSE, CAPILLARY
GLUCOSE-CAPILLARY: 134 mg/dL — AB (ref 65–99)
Glucose-Capillary: 135 mg/dL — ABNORMAL HIGH (ref 65–99)

## 2017-07-14 SURGERY — GREEN LIGHT LASER TURP (TRANSURETHRAL RESECTION OF PROSTATE
Anesthesia: General | Wound class: Clean Contaminated

## 2017-07-14 MED ORDER — PROPOFOL 10 MG/ML IV BOLUS
INTRAVENOUS | Status: AC
  Filled 2017-07-14: qty 20

## 2017-07-14 MED ORDER — FAMOTIDINE 20 MG PO TABS
20.0000 mg | ORAL_TABLET | Freq: Once | ORAL | Status: AC
  Administered 2017-07-14: 20 mg via ORAL

## 2017-07-14 MED ORDER — EPHEDRINE SULFATE 50 MG/ML IJ SOLN
INTRAMUSCULAR | Status: AC
Start: 2017-07-14 — End: 2017-07-14
  Filled 2017-07-14: qty 1

## 2017-07-14 MED ORDER — PROMETHAZINE HCL 25 MG/ML IJ SOLN: 6.2500 mg | INTRAMUSCULAR | Status: DC | PRN

## 2017-07-14 MED ORDER — FENTANYL CITRATE (PF) 100 MCG/2ML IJ SOLN
INTRAMUSCULAR | Status: DC | PRN
  Administered 2017-07-14 (×4): 25 ug via INTRAVENOUS

## 2017-07-14 MED ORDER — EPHEDRINE SULFATE 50 MG/ML IJ SOLN
INTRAMUSCULAR | Status: DC | PRN
  Administered 2017-07-14 (×3): 10 mg via INTRAVENOUS

## 2017-07-14 MED ORDER — MIDAZOLAM HCL 2 MG/2ML IJ SOLN
INTRAMUSCULAR | Status: AC
  Filled 2017-07-14: qty 2

## 2017-07-14 MED ORDER — FENTANYL CITRATE (PF) 100 MCG/2ML IJ SOLN
INTRAMUSCULAR | Status: AC
  Filled 2017-07-14: qty 2

## 2017-07-14 MED ORDER — MEPERIDINE HCL 50 MG/ML IJ SOLN: 6.2500 mg | INTRAMUSCULAR | Status: DC | PRN

## 2017-07-14 MED ORDER — DOCUSATE SODIUM 100 MG PO CAPS
200.0000 mg | ORAL_CAPSULE | Freq: Two times a day (BID) | ORAL | 3 refills | Status: AC
Start: 2017-07-14 — End: ?

## 2017-07-14 MED ORDER — LIDOCAINE HCL 2 % EX GEL
CUTANEOUS | Status: AC
  Filled 2017-07-14: qty 10

## 2017-07-14 MED ORDER — FAMOTIDINE 20 MG PO TABS
ORAL_TABLET | ORAL | Status: AC
  Administered 2017-07-14: 20 mg via ORAL
  Filled 2017-07-14: qty 1

## 2017-07-14 MED ORDER — LIDOCAINE HCL 2 % EX GEL
CUTANEOUS | Status: DC | PRN
  Administered 2017-07-14: 1

## 2017-07-14 MED ORDER — CEFAZOLIN SODIUM-DEXTROSE 2-4 GM/100ML-% IV SOLN
INTRAVENOUS | Status: DC
Start: 2017-07-14 — End: 2017-07-14
  Filled 2017-07-14: qty 100

## 2017-07-14 MED ORDER — PROPOFOL 10 MG/ML IV BOLUS
INTRAVENOUS | Status: DC | PRN
  Administered 2017-07-14: 80 mg via INTRAVENOUS
  Administered 2017-07-14: 30 mg via INTRAVENOUS
  Administered 2017-07-14: 50 mg via INTRAVENOUS

## 2017-07-14 MED ORDER — DEXAMETHASONE SODIUM PHOSPHATE 10 MG/ML IJ SOLN
INTRAMUSCULAR | Status: DC | PRN
  Administered 2017-07-14: 5 mg via INTRAVENOUS

## 2017-07-14 MED ORDER — ONDANSETRON HCL 4 MG/2ML IJ SOLN
INTRAMUSCULAR | Status: DC | PRN
  Administered 2017-07-14: 4 mg via INTRAVENOUS

## 2017-07-14 MED ORDER — CEFAZOLIN SODIUM-DEXTROSE 1-4 GM/50ML-% IV SOLN
INTRAVENOUS | Status: AC
  Filled 2017-07-14: qty 50

## 2017-07-14 MED ORDER — BELLADONNA ALKALOIDS-OPIUM 16.2-60 MG RE SUPP
RECTAL | Status: AC
  Filled 2017-07-14: qty 1

## 2017-07-14 MED ORDER — CIPROFLOXACIN HCL 500 MG PO TABS: 500.0000 mg | ORAL_TABLET | Freq: Two times a day (BID) | ORAL | 0 refills | Status: DC

## 2017-07-14 MED ORDER — DEXAMETHASONE SODIUM PHOSPHATE 10 MG/ML IJ SOLN
INTRAMUSCULAR | Status: AC
  Filled 2017-07-14: qty 1

## 2017-07-14 MED ORDER — FENTANYL CITRATE (PF) 100 MCG/2ML IJ SOLN: 25.0000 ug | INTRAMUSCULAR | Status: DC | PRN

## 2017-07-14 MED ORDER — OXYCODONE HCL 5 MG PO TABS: 5.0000 mg | ORAL_TABLET | Freq: Once | ORAL | Status: DC | PRN

## 2017-07-14 MED ORDER — ONDANSETRON HCL 4 MG/2ML IJ SOLN
INTRAMUSCULAR | Status: AC
Start: 2017-07-14 — End: 2017-07-14
  Filled 2017-07-14: qty 2

## 2017-07-14 MED ORDER — LIDOCAINE HCL (CARDIAC) 20 MG/ML IV SOLN
INTRAVENOUS | Status: DC | PRN
  Administered 2017-07-14: 60 mg via INTRAVENOUS

## 2017-07-14 MED ORDER — SEVOFLURANE IN SOLN
RESPIRATORY_TRACT | Status: AC
  Filled 2017-07-14: qty 250

## 2017-07-14 MED ORDER — BELLADONNA ALKALOIDS-OPIUM 16.2-60 MG RE SUPP
RECTAL | Status: DC | PRN
  Administered 2017-07-14: 1 via RECTAL

## 2017-07-14 MED ORDER — OXYCODONE HCL 5 MG/5ML PO SOLN: 5.0000 mg | Freq: Once | ORAL | Status: DC | PRN

## 2017-07-14 MED ORDER — UROGESIC-BLUE 81.6 MG PO TABS: 1.0000 | ORAL_TABLET | Freq: Four times a day (QID) | ORAL | 3 refills | Status: DC | PRN

## 2017-07-14 MED ORDER — SODIUM CHLORIDE 0.9 % IV SOLN
INTRAVENOUS | Status: DC
  Administered 2017-07-14: 12:00:00 via INTRAVENOUS

## 2017-07-14 SURGICAL SUPPLY — 27 items
ADAPTER IRRIG TUBE 2 SPIKE SOL (ADAPTER) ×6 IMPLANT
BAG URO DRAIN 2000ML W/SPOUT (MISCELLANEOUS) ×3 IMPLANT
CATH FOLEY 2WAY  5CC 20FR SIL (CATHETERS) ×2
CATH FOLEY 2WAY 5CC 20FR SIL (CATHETERS) ×1 IMPLANT
GLOVE BIO SURGEON STRL SZ7 (GLOVE) ×6 IMPLANT
GLOVE BIO SURGEON STRL SZ7.5 (GLOVE) ×3 IMPLANT
GOWN STRL REUS W/ TWL LRG LVL3 (GOWN DISPOSABLE) ×1 IMPLANT
GOWN STRL REUS W/ TWL XL LVL3 (GOWN DISPOSABLE) ×1 IMPLANT
GOWN STRL REUS W/TWL LRG LVL3 (GOWN DISPOSABLE) ×2
GOWN STRL REUS W/TWL XL LVL3 (GOWN DISPOSABLE) ×2
IV NS 1000ML (IV SOLUTION) ×2
IV NS 1000ML BAXH (IV SOLUTION) ×1 IMPLANT
IV SET PRIMARY 15D 139IN B9900 (IV SETS) ×3 IMPLANT
KIT RM TURNOVER CYSTO AR (KITS) ×3 IMPLANT
LASER GREENLIGHT XPS PROCEDURE (MISCELLANEOUS) ×3 IMPLANT
LASER GRNLGT MOXY FIBER 750UM (MISCELLANEOUS) ×3 IMPLANT
PACK CYSTO AR (MISCELLANEOUS) ×3 IMPLANT
SET IRRIG Y TYPE TUR BLADDER L (SET/KITS/TRAYS/PACK) ×3 IMPLANT
SOL .9 NS 3000ML IRR  AL (IV SOLUTION) ×10
SOL .9 NS 3000ML IRR UROMATIC (IV SOLUTION) ×5 IMPLANT
SOL PREP PVP 2OZ (MISCELLANEOUS) ×3
SOLUTION PREP PVP 2OZ (MISCELLANEOUS) ×1 IMPLANT
SURGILUBE 2OZ TUBE FLIPTOP (MISCELLANEOUS) ×3 IMPLANT
SYRINGE IRR TOOMEY STRL 70CC (SYRINGE) ×3 IMPLANT
TUBING CONNECTING 10 (TUBING) ×2 IMPLANT
TUBING CONNECTING 10' (TUBING) ×1
WATER STERILE IRR 1000ML POUR (IV SOLUTION) ×3 IMPLANT

## 2017-07-14 NOTE — Transfer of Care (Signed)
Immediate Anesthesia Transfer of Care Note  Patient: Gary Chang  Procedure(s) Performed: Procedure(s): GREEN LIGHT LASER TURP (TRANSURETHRAL RESECTION OF PROSTATE (N/A)  Patient Location: PACU  Anesthesia Type:General  Level of Consciousness: sedated  Airway & Oxygen Therapy: Patient Spontanous Breathing and Patient connected to face mask oxygen  Post-op Assessment: Report given to RN and Post -op Vital signs reviewed and stable  Post vital signs: Reviewed and stable  Last Vitals:  Vitals:   07/14/17 1158 07/14/17 1438  BP: (!) 145/77   Pulse: 72   Resp: 16   Temp: 36.7 C (P) 36.4 C    Last Pain:  Vitals:   07/14/17 1158  TempSrc: Oral         Complications: No apparent anesthesia complications

## 2017-07-14 NOTE — Anesthesia Preprocedure Evaluation (Signed)
Anesthesia Evaluation  Patient identified by MRN, date of birth, ID band Patient awake    Reviewed: Allergy & Precautions, NPO status , Patient's Chart, lab work & pertinent test results  History of Anesthesia Complications Negative for: history of anesthetic complications  Airway Mallampati: III  TM Distance: >3 FB Neck ROM: Full    Dental no notable dental hx.    Pulmonary neg sleep apnea, COPD, former smoker,    breath sounds clear to auscultation- rhonchi (-) wheezing      Cardiovascular hypertension, Pt. on medications + Peripheral Vascular Disease  (-) CAD, (-) Past MI and (-) Cardiac Stents  Rhythm:Regular Rate:Normal - Systolic murmurs and - Diastolic murmurs    Neuro/Psych  Headaches, PSYCHIATRIC DISORDERS Anxiety Depression    GI/Hepatic Neg liver ROS, hiatal hernia, GERD  ,  Endo/Other  diabetes, Oral Hypoglycemic Agents  Renal/GU Renal disease: hx of nephrolithiasis.     Musculoskeletal  (+) Arthritis ,   Abdominal (+) + obese,   Peds  Hematology negative hematology ROS (+)   Anesthesia Other Findings Past Medical History: No date: Abdominal aortic aneurysm (AAA) ([facility]) No date: Allergic state No date: Anxiety No date: Arthritis No date: Cancer ([place])     Comment:  skin ca removed No date: COPD (chronic obstructive pulmonary disease) ([facility]) No date: Depression No date: Diabetes mellitus     Comment:  tingling in feet No date: Enlarged prostate     Comment:  laser surgery No date: GERD (gastroesophageal reflux disease) No date: H/O hiatal hernia No date: High cholesterol No date: Hypertension     Comment:  pcp  dr Jeneen Rinks hedrick  [place] No date: Kidney stones No date: Melanoma ([place]) No date: Pneumonia No date: Renal artery aneurysm ([facility])   Reproductive/Obstetrics                             Anesthesia Physical Anesthesia Plan  ASA: III  Anesthesia Plan:  General   Post-op Pain Management:    Induction: Intravenous  PONV Risk Score and Plan: 1 and Ondansetron and Dexamethasone  Airway Management Planned: LMA  Additional Equipment:   Intra-op Plan:   Post-operative Plan:   Informed Consent: I have reviewed the patients History and Physical, chart, labs and discussed the procedure including the risks, benefits and alternatives for the proposed anesthesia with the patient or authorized representative who has indicated his/her understanding and acceptance.   Dental advisory given  Plan Discussed with: CRNA and Anesthesiologist  Anesthesia Plan Comments:         Anesthesia Quick Evaluation

## 2017-07-14 NOTE — Anesthesia Procedure Notes (Signed)
Procedure Name: LMA Insertion Date/Time: 07/14/2017 1:18 PM Performed by: Dionne Bucy Pre-anesthesia Checklist: Patient identified, Patient being monitored, Timeout performed, Emergency Drugs available and Suction available Patient Re-evaluated:Patient Re-evaluated prior to induction Oxygen Delivery Method: Circle system utilized Preoxygenation: Pre-oxygenation with 100% oxygen Induction Type: IV induction Ventilation: Mask ventilation without difficulty LMA: LMA inserted LMA Size: 5.0 Tube type: Oral Number of attempts: 1 Placement Confirmation: positive ETCO2 and breath sounds checked- equal and bilateral Tube secured with: Tape Dental Injury: Teeth and Oropharynx as per pre-operative assessment

## 2017-07-14 NOTE — Anesthesia Post-op Follow-up Note (Cosign Needed)
Anesthesia QCDR form completed.        

## 2017-07-14 NOTE — Discharge Instructions (Signed)
[facility] Laser Prostate Treatment Green light laser therapy is a procedure that uses a high-energy laser to get rid of extra prostate tissue by turning the tissue into a vapor. It is less invasive than traditional methods of prostate surgery, which involve cutting out the prostate tissue. Because the tissue is turned into a vapor (vaporized) rather than cut out, there is generally less blood loss. This surgery is used to treat an enlarged prostate gland (benign prostatic hyperplasia). Tell a health care provider about:  Any allergies you have.  All medicines you are taking, including vitamins, herbs, eye drops, creams, and over-the-counter medicines.  Any problems you or family members have had with anesthetic medicines.  Any blood disorders you have.  Any surgeries you have had.  Any medical conditions you have. What are the risks? Generally, this is a safe procedure. However, problems may occur, including:  Infection.  Bleeding.  Allergic reaction to medicines.  Damage to other structures or organs.  Blood in the urine (hematuria).  Painful urination.  Urinary tract infection.  Erectile dysfunction (rare).  Dry ejaculation.  Scar tissue in the urinary passage.  What happens before the procedure? Staying hydrated Follow instructions from your health care provider about hydration, which may include:  Up to 2 hours before the procedure - you may continue to drink clear liquids, such as water, clear fruit juice, black coffee, and plain tea.  Eating and drinking restrictions Follow instructions from your health care provider about eating and drinking, which may include:  8 hours before the procedure - stop eating heavy meals or foods such as meat, fried foods, or fatty foods.  6 hours before the procedure - stop eating light meals or foods, such as toast or cereal.  6 hours before the procedure - stop drinking milk or drinks that contain milk.  2 hours before the  procedure - stop drinking clear liquids.  Medicines  Ask your health care provider about: ? Changing or stopping your regular medicines. This is especially important if you are taking diabetes medicines or blood thinners. ? Taking medicines such as aspirin and ibuprofen. These medicines can thin your blood. Do not take these medicines before your procedure if your health care provider instructs you not to.  You may be prescribed antibiotic medicine. If so, take your antibiotic as told by your health care provider. Do not stop taking the antibiotic even if you start to feel better. General instructions  Plan to have someone take you home from the hospital or clinic.  If you will be going home right after the procedure, plan to have someone with you for 24 hours. What happens during the procedure?  To reduce your risk of infection: ? Your health care team will wash or sanitize their hands. ? Your skin will be washed with soap.  You will be given one or more of the following: ? A medicine to help you relax (sedative). ? A medicine to make you fall asleep (general anesthetic). ? A medicine that is injected into your spine to numb the area below and slightly above the injection site (spinal anesthetic).  A tube containing viewing scopes and instruments (fiber-optic scope) will be inserted through your penis.  A thin fiber will be put through the tube and positioned next to the extra prostate tissue.  Pulses of laser light will come from the end of the fiber and be projected onto the extra tissue. Your blood will absorb the light, become hot, and vaporize the  extra prostate tissue.  The heat from the laser beam will seal off the blood vessels, which will lessen bleeding.  The fiber-optic scope will be removed and replaced with a temporary tube (catheter) that is used to help urine flow. The procedure may vary among health care providers and hospitals. What happens after the  procedure?  Your blood pressure, heart rate, breathing rate, and blood oxygen level will be monitored until the medicines you were given have worn off.  Depending on factors such as the amount of prostate tissue that was vaporized, the strength of your bladder, and the amount of bleeding expected, your catheter may be removed.  You may be given elastic support stockings to wear to help prevent blood clots in your legs.  Do not drive for 24 hours if you were given a sedative, or for as long as directed by your health care provider. Summary  Green light laser therapy is a procedure that uses a high-energy laser that turns extra prostate tissue into a vapor.  This procedure is less invasive than traditional methods of prostate surgery.  Follow instructions from your health care provider about eating and drinking before the procedure.  Pulses of laser light will come from the end of a thin fiber and be aimed at the extra prostate tissue. Your blood will absorb the light, become hot, and vaporize the extra tissue. This information is not intended to replace advice given to you by your health care provider. Make sure you discuss any questions you have with your health care provider. Document Released: 03/16/2008 Document Revised: 12/27/2016 Document Reviewed: 12/27/2016 Elsevier Interactive Patient Education  2017 [employer]   Benign Prostatic Hyperplasia Benign prostatic hyperplasia is when the prostate gland is bigger than normal (enlarged). The prostate is a gland that produces the fluid that goes into semen. It is near the opening to the bladder and it surrounds the tube that drains urine out of the body (urethra). Benign prostatic hyperplasia is common among older men and it typically causes problems with urinating. The prostate grows slowly as you age. As the prostate grows, it can pinch the urethra. This causes the bladder to work too hard to pass urine, which leads to a thickened  bladder wall. The bladder may eventually become weak and unable to empty completely. What are the causes? The exact cause of this condition is not known. It may be related to changes in hormones as the body ages. What increases the risk? You are more likely to develop this condition if:  You have a family history of the condition.  You are age 102 or older.  You have a history of erectile dysfunction.  You do not exercise.  You have certain medical conditions, including: ? Type 2 diabetes. ? Obesity. ? Heart and circulatory disease.  What are the signs or symptoms? Symptoms of this condition include:  Weak or interrupted urine stream.  Dribbling or leaking urine.  Feeling like the bladder has not emptied completely.  Difficulty starting urination.  Getting up frequently at night to urinate.  Urinating more often (8 or more times a day).  Accidental loss of urine (urinary incontinence).  Pain during urination or ejaculation.  Urine with an unusual smell or color.  The size of the prostate does not always determine the severity of the symptoms. For example, a man with a large prostate may experience minor symptoms, or a man with a smaller prostate may experience a severe blockage. How is this diagnosed? This  condition may be diagnosed based on:  Your medical history and symptoms.  A physical exam. This usually includes a digital rectal exam. During this exam, your health care provider places a gloved, lubricated finger into the rectum to feel the size of the prostate.  A blood test. This test checks for high levels of a protein that is produced by the prostate (prostate specific antigen, PSA).  Tests to examine how well the urethra and bladder are functioning (urodynamic tests).  Cystoscopy. For this test, a small, tube-shaped instrument (cystoscope) is used to look inside the urethra and bladder. The cystoscope is placed into the urinary tract through the opening at  the tip of the penis.  Urine tests.  Ultrasound.  How is this treated? Treatment for this condition depends on how severe your symptoms are. Treatment may include:  Active surveillance or "watchful waiting." If your symptoms are mild, your health care provider may delay treatment and ask you to keep track of your symptoms. You will have regular checkups to examine the size of your prostate, discuss symptoms, and determine whether treatment is needed.  Medicines. These may be used to: ? Stop prostate growth. ? Shrink the prostate. ? Relieve symptoms.  Lifestyle changes, including: ? Pelvic floor muscle exercises. The pelvic floor muscles are a group of muscles that relax when you urinate. ? Bladder training. This involves exercises that train the bladder to hold more urine for longer periods. ? Reducing the amount of liquid that you drink. This is especially important before sleeping and before long periods of time spent in public. ? Reducing the amount of caffeine and alcohol that you drink. ? Treating or preventing constipation.  Surgery to reduce the size of the prostate or widen the urethra. This is typically done if your symptoms are severe or there are serious complications from the enlarged prostate.  Follow these instructions at home: Medicines  Take over-the-counter and prescription medicines as told by your health care provider.  Avoid certain medicines, such as decongestants, antihistamines, and some prescription medicines as told by your health care provider. Ask your health care provider which medicines you should avoid. General instructions  Monitor your symptoms for any changes. Tell your health care provider about any changes.  Give yourself time when you urinate.  Avoid certain beverages that can irritate the bladder, such as: ? Alcohol. ? Caffeinated drinks like coffee, tea, and cola.  Avoid drinking large amounts of liquid before bed or before going out in  public.  Do pelvic floor muscle or bladder training exercises as told by your health care provider.  Keep all follow-up visits as told by your health care provider. This is important. Contact a health care provider if:  Your develop new or worse symptoms.  You have trouble getting or maintaining an erection.  You have a fever.  You have pain or burning during urination.  You have blood in your urine. Get help right away if:  You have severe pain when urinating.  You cannot urinate.  You have severe pain in your abdomen.  You are dizzy.  You faint.  You have severe back pain.  Your urine is dark red and difficult to see through.  You have large blood clots in your urine.  You have severe pain after an erection.  You have chest pain, dizziness, or nausea during sexual activity. Summary  The prostate is a gland that produces the fluid that goes into semen. It is near the opening to the  bladder and it surrounds the tube that drains urine out of the body (urethra).  Benign prostatic hyperplasia is common among older men and it typically causes problems with urinating.  If your symptoms are mild, your health care provider may delay treatment and ask you to keep track of your symptoms. You will have regular checkups to examine the size of your prostate, discuss symptoms, and determine whether treatment is needed.  If directed, you may need to avoid certain medicines, such as decongestants, antihistamines, and some prescription medicines.  Contact your health care provider if you develop new or worse symptoms. This information is not intended to replace advice given to you by your health care provider. Make sure you discuss any questions you have with your health care provider. Document Released: 12/08/2005 Document Revised: 10/27/2016 Document Reviewed: 10/27/2016 Elsevier Interactive Patient Education  2017 [employer]   Transurethral Vaporization of Prostate, Care  After This sheet gives you information about how to care for yourself after your procedure. Your health care provider may also give you more specific instructions. If you have problems or questions, contact your health care provider. What can I expect after the procedure? After the procedure, it is common to have:  Pain or discomfort around your penis.  Blood in your urine.  Burning during urination.  Sudden urges to urinate.  Little or no semen produced during ejaculation (retrograde ejaculation).  Follow these instructions at home: Medicines  Take over-the-counter and prescription medicines only as told by your health care provider.  If you were prescribed an antibiotic medicine, take it as told by your health care provider. Do not stop taking the antibiotic even if you start to feel better. Activity  Do not drive for 24 hours if you were given a medicine to help you relax (sedative) during your procedure.  Do not drive or use heavy machinery while taking prescription pain medicine.  Do not resume sexual activity until your health care provider approves. Ask your health care provider: ? What activities are safe for you. ? When you may return to your normal activities.  Do not lift anything that is heavier than 10 lb (4.5 kg), or the limit that you are told, until your health care provider says that it is safe. General instructions  Drink enough fluid to keep your urine clear or pale yellow.  To prevent or treat constipation while you are taking prescription pain medicine, your health care provider may recommend that you: ? Take over-the-counter or prescription medicines. ? Eat foods that are high in fiber, such as fresh fruits and vegetables, whole grains, and beans. ? Limit foods that are high in fat and processed sugars, such as fried and sweet foods.  If you go home with a tube draining your urine (urinary catheter), care for your catheter as told by your health care  provider. This may include: ? Washing your hands before and after you touch the catheter. ? Keeping the area around the catheter clean and dry. ? Emptying the catheter bag when it is full and monitoring the amount and color of your urine. ? Avoiding bending or breaking the catheter. ? Keeping air out of the catheter. ? Not putting the catheter underwater. ? Visiting your health care provider to have the catheter removed.  Keep all follow-up visits as told by your health care provider. This is important. Contact a health care provider if:  You have: ? A fever or chills. ? Trouble urinating or controlling when you urinate. ?  More blood in your urine instead of less. ? Problems getting an erection.  You start to pass blood clots in your urine.  You have nausea or you vomit.  There is more redness, swelling, or pain in your penis. Get help right away if:  You have bright red urine.  You cannot urinate.  You have severe pain that does not get better with medicine.  You have shortness of breath. Summary  After this procedure, it is common to have some blood in your urine. If you see bright red blood in your urine, however, you should get medical help right away.  This procedure may cause you to produce little or no semen when ejaculating (retrograde ejaculation).  Follow restrictions about lifting and sexual activity as told by your health care provider. Ask what activities are safe for you.  Drink enough fluid to keep your urine clear or pale yellow. This information is not intended to replace advice given to you by your health care provider. Make sure you discuss any questions you have with your health care provider. Document Released: 02/18/2017 Document Revised: 02/18/2017 Document Reviewed: 02/18/2017 Elsevier Interactive Patient Education  2018 [place]   1) The drugs that you were given will stay in your system until  tomorrow so for the next 24 hours you should not:  A) Drive an automobile B) Make any legal decisions C) Drink any alcoholic beverage   2) You may resume regular meals tomorrow.  Today it is better to start with liquids and gradually work up to solid foods.  You may eat anything you prefer, but it is better to start with liquids, then soup and crackers, and gradually work up to solid foods.   3) Please notify your doctor immediately if you have any unusual bleeding, trouble breathing, redness and pain at the surgery site, drainage, fever, or pain not relieved by medication.    4) Additional Instructions:   Please contact your physician with any problems or Same Day Surgery at [phone], Monday through Friday 6 am to 4 pm, or [place] number at [phone].

## 2017-07-14 NOTE — H&P (Signed)
Date of Initial H&P: 07/09/17  History reviewed, patient examined, no change in status, stable for surgery.

## 2017-07-14 NOTE — OR Nursing (Signed)
1630 demonstrated irrigation of urinary catheter.  Emptied 125cc of clear pink urine

## 2017-07-14 NOTE — Op Note (Signed)
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 Yves Dill MD  Anesthesia: General  Indications:See the history and physical. After informed consent the above procedure(s) were requested     Technique and findings: After adequate general anesthesia been obtained the patient was placed into dorsal lithotomy position and the perineum was prepped and draped in the usual fashion. The Laserscope was coupled to the camera and visually advanced into the bladder. The bladder was thoroughly inspected. Both ureteral orifices were identified and had clear efflux. No bladder lesions were identified. The bladder was moderately trabeculated. The patient had trilobar BPH with a prominent anterior median lobe. At this point the XPS greenlight laser fiber was introduced through the scope and set at 38 W. The bladder neck tissue and anterior median lobe was vaporized. The power was increased to 120 W intervening tissue from the bladder neck to the verumontanum was vaporized. The bleeders were then cauterized. The scope was removed and 10 cc of viscous Xylocaine instilled within the urethra. A 20 French silicone catheter was placed and irrigated until clear. A B&O suppository was placed. The procedure was terminated and patient transferred to the recovery room in stable condition. Estimated blood loss was less than 50 cc.

## 2017-07-15 ENCOUNTER — Encounter: Payer: Self-pay | Admitting: Urology

## 2017-07-15 NOTE — Anesthesia Postprocedure Evaluation (Signed)
Anesthesia Post Note  Patient: Gary Chang  Procedure(s) Performed: Procedure(s) (LRB): GREEN LIGHT LASER TURP (TRANSURETHRAL RESECTION OF PROSTATE (N/A)  Patient location during evaluation: PACU Anesthesia Type: General Level of consciousness: awake and alert Pain management: pain level controlled Vital Signs Assessment: post-procedure vital signs reviewed and stable Respiratory status: spontaneous breathing, nonlabored ventilation, respiratory function stable and patient connected to nasal cannula oxygen Cardiovascular status: blood pressure returned to baseline and stable Postop Assessment: no signs of nausea or vomiting Anesthetic complications: no     Last Vitals:  Vitals:   07/14/17 1540 07/14/17 1635  BP: (!) 164/65 (!) 156/73  Pulse: 77 73  Resp: 16 16  Temp: 36.4 C     Last Pain:  Vitals:   07/14/17 1635  TempSrc:   PainSc: 1                  Martha Clan

## 2017-07-17 DIAGNOSIS — Z79899 Other long term (current) drug therapy: Secondary | ICD-10-CM | POA: Diagnosis not present

## 2017-07-17 DIAGNOSIS — E119 Type 2 diabetes mellitus without complications: Secondary | ICD-10-CM | POA: Diagnosis not present

## 2017-07-17 DIAGNOSIS — I10 Essential (primary) hypertension: Secondary | ICD-10-CM | POA: Insufficient documentation

## 2017-07-17 DIAGNOSIS — Z87891 Personal history of nicotine dependence: Secondary | ICD-10-CM | POA: Diagnosis not present

## 2017-07-17 DIAGNOSIS — T839XXA Unspecified complication of genitourinary prosthetic device, implant and graft, initial encounter: Secondary | ICD-10-CM | POA: Diagnosis not present

## 2017-07-17 DIAGNOSIS — Y733 Surgical instruments, materials and gastroenterology and urology devices (including sutures) associated with adverse incidents: Secondary | ICD-10-CM | POA: Diagnosis not present

## 2017-07-17 DIAGNOSIS — J449 Chronic obstructive pulmonary disease, unspecified: Secondary | ICD-10-CM | POA: Insufficient documentation

## 2017-07-17 DIAGNOSIS — Z7984 Long term (current) use of oral hypoglycemic drugs: Secondary | ICD-10-CM | POA: Diagnosis not present

## 2017-07-17 DIAGNOSIS — T83091A Other mechanical complication of indwelling urethral catheter, initial encounter: Secondary | ICD-10-CM | POA: Diagnosis not present

## 2017-07-17 DIAGNOSIS — R319 Hematuria, unspecified: Secondary | ICD-10-CM | POA: Diagnosis present

## 2017-07-17 LAB — CBC
HEMATOCRIT: 42.8 % (ref 40.0–52.0)
Hemoglobin: 14.2 g/dL (ref 13.0–18.0)
MCH: 28.1 pg (ref 26.0–34.0)
MCHC: 33.2 g/dL (ref 32.0–36.0)
MCV: 84.7 fL (ref 80.0–100.0)
PLATELETS: 200 10*3/uL (ref 150–440)
RBC: 5.05 MIL/uL (ref 4.40–5.90)
RDW: 14.2 % (ref 11.5–14.5)
WBC: 10 10*3/uL (ref 3.8–10.6)

## 2017-07-17 LAB — URINALYSIS, COMPLETE (UACMP) WITH MICROSCOPIC
BILIRUBIN URINE: NEGATIVE
Bacteria, UA: NONE SEEN
Glucose, UA: NEGATIVE mg/dL
KETONES UR: NEGATIVE mg/dL
NITRITE: NEGATIVE
PH: 6 (ref 5.0–8.0)
Protein, ur: 100 mg/dL — AB
SQUAMOUS EPITHELIAL / LPF: NONE SEEN
Specific Gravity, Urine: 1.016 (ref 1.005–1.030)

## 2017-07-17 NOTE — ED Triage Notes (Signed)
Patient had prostate surgery on Tuesday, too urinary catheter out on Wednesday morning.  Patient was unable to void, so catheter replaced.  About one hour PTA, patient noticed hematuria and decreased urinary output.

## 2017-07-18 ENCOUNTER — Emergency Department
Admission: EM | Admit: 2017-07-18 | Discharge: 2017-07-18 | Disposition: A | Discharge status: Home / Self Care | Payer: PPO | Attending: Emergency Medicine | Admitting: Emergency Medicine

## 2017-07-18 DIAGNOSIS — T839XXA Unspecified complication of genitourinary prosthetic device, implant and graft, initial encounter: Secondary | ICD-10-CM

## 2017-07-18 NOTE — ED Notes (Signed)
Patient's catheter irrigated with 140 cc sterile water.  140 cc sterile water returned in bag with no clots present at this time.  Patient tolerated this well at this time with no complications.

## 2017-07-18 NOTE — ED Provider Notes (Signed)
[facility] Emergency Department Provider Note    First MD Initiated Contact with Patient 07/18/17 0024     (approximate)  I have reviewed the triage vital signs and the nursing notes.   HISTORY  Chief Complaint urinary catheter problem   HPI Gary Chang is a 83 y.o. male with blows chronic medical conditions presents to the emergency department with history of prostate surgery on Tuesday with subsequent Foley catheter placement. Proximal and one hour before arrival patient noted catheter was not draining despite the sensation that he needed to void. Patient states that urine was indeed hematuria beforehand. Patient states while waiting the emergency department he noted that urine started to drain again and he no longer has any discomfort.   Past Medical History:  Diagnosis Date  . Abdominal aortic aneurysm (AAA) ([place])   . Allergic state   . Anxiety   . Arthritis   . Cancer ([place])    skin ca removed  . COPD (chronic obstructive pulmonary disease) ([place])   . Depression   . Diabetes mellitus    tingling in feet  . Enlarged prostate    laser surgery  . GERD (gastroesophageal reflux disease)   . H/O hiatal hernia   . High cholesterol   . Hypertension    pcp  dr Jeneen Rinks [place]  [place]  . Kidney stones   . Melanoma ([place])   . Pneumonia   . Renal artery aneurysm [facility])     Patient Active Problem List   Diagnosis Date Noted  . Abdominal pain 01/30/2017  . Ventral hernia without obstruction or gangrene 01/30/2017  . Tension headache 04/30/2016  . Atypical facial pain 04/30/2016  . Left-sided weakness   . Occlusion and stenosis of basilar artery   . Vertebral artery stenosis   . Basilar artery stenosis 02/28/2016  . Dizziness and giddiness 11/29/2013  . Abnormality of gait 11/29/2013  . Diabetic neuropathy ([place]) 11/29/2013  . Memory loss 11/29/2013  . Headache, paroxysmal hemicrania, episodic 11/29/2013    Past Surgical History:    Procedure Laterality Date  . ABDOMINAL AORTIC ANEURYSM REPAIR    . BACK SURGERY     lumbar  ,cervical surgeries  . CATARACT EXTRACTION W/PHACO Left 12/11/2016   Procedure: CATARACT EXTRACTION PHACO AND INTRAOCULAR LENS PLACEMENT (IOC);  Surgeon: Leandrew Koyanagi, MD;  Location: [facility] ORS;  Service: Ophthalmology;  Laterality: Left;  [place] 1:17 AP% 17.7 CDE 13.8 FLUID PACK LOT # [id number]  . ESOPHAGOGASTRODUODENOSCOPY (EGD) WITH PROPOFOL N/A 02/11/2016   Procedure: ESOPHAGOGASTRODUODENOSCOPY (EGD) WITH PROPOFOL;  Surgeon: Hulen Luster, MD;  Location: [facility] ENDOSCOPY;  Service: Gastroenterology;  Laterality: N/A;  . EYE SURGERY     right cataract  . GREEN LIGHT LASER TURP (TRANSURETHRAL RESECTION OF PROSTATE N/A 07/14/2017   Procedure: GREEN LIGHT LASER TURP (TRANSURETHRAL RESECTION OF PROSTATE;  Surgeon: Royston Cowper, MD;  Location: [facility] ORS;  Service: Urology;  Laterality: N/A;  . HERNIA REPAIR     inguinal  . KIDNEY STONE SURGERY    . LUMBAR LAMINECTOMY/DECOMPRESSION MICRODISCECTOMY  12/10/2011   Procedure: LUMBAR LAMINECTOMY/DECOMPRESSION MICRODISCECTOMY;  Surgeon: Ophelia Charter;  Location: [facility] NEURO ORS;  Service: Neurosurgery;;  Thoracic One-Two Laminectomy    Prior to Admission medications   Medication Sig Start Date End Date Taking? Authorizing Provider  acetaminophen (TYLENOL) 500 MG tablet Take 500 mg by mouth every 8 (eight) hours as needed for mild pain.    [provider]  carbamazepine (TEGRETOL XR) 100 MG 12 hr  tablet Take 100 mg by mouth 2 (two) times daily. 09/09/16   [provider]  cholecalciferol (VITAMIN D) 1000 units tablet Take 1,000 Units by mouth daily.    [provider]  ciprofloxacin (CIPRO) 500 MG tablet Take 1 tablet (500 mg total) by mouth 2 (two) times daily. 07/14/17   Royston Cowper, MD  docusate sodium (COLACE) 100 MG capsule Take 2 capsules (200 mg total) by mouth 2 (two) times daily. 07/14/17   Royston Cowper, MD  donepezil  (ARICEPT) 5 MG tablet Take 5 mg by mouth every morning. 04/16/16   [provider]  glipiZIDE (GLUCOTROL XL) 2.5 MG 24 hr tablet Take 2.5 mg by mouth at bedtime.      [provider]  GLUCOSAMINE-CHONDROITIN PO Take 1 tablet by mouth daily.    [provider]  hydrALAZINE (APRESOLINE) 100 MG tablet Take 100 mg by mouth 3 (three) times daily.  11/02/13   [provider]  hydrochlorothiazide (HYDRODIURIL) 25 MG tablet Take 25 mg by mouth every morning.     [provider]  HYDROcodone-acetaminophen (NORCO/VICODIN) 5-325 MG per tablet Take 1 tablet by mouth 2 (two) times daily as needed for moderate pain.  10/25/13   [provider]  losartan (COZAAR) 100 MG tablet Take 100 mg by mouth every evening.     [provider]  Magnesium Oxide 250 MG TABS Take 250 mg by mouth every evening.    [provider]  Methen-Hyosc-Meth Blue-Na Phos (UROGESIC-BLUE) 81.6 MG TABS Take 1 tablet (81.6 mg total) by mouth every 6 (six) hours as needed (dysuria). 07/14/17   Royston Cowper, MD  metoprolol succinate (TOPROL-XL) 25 MG 24 hr tablet Take 25 mg by mouth daily.     [provider]  potassium chloride SA (K-DUR,KLOR-CON) 20 MEQ tablet Take 20 mEq by mouth 2 (two) times daily.    [provider]  sertraline (ZOLOFT) 100 MG tablet Take 100 mg by mouth daily.    [provider]  vitamin B-12 (CYANOCOBALAMIN) 500 MCG tablet Take 500 mcg by mouth daily.    [provider]    Allergies Ezetimibe and Statins  Family History  Problem Relation Age of Onset  . Heart Problems Mother   . Tuberculosis Father   . Anesthesia problems Neg Hx   . Hypotension Neg Hx   . Malignant hyperthermia Neg Hx   . Pseudochol deficiency Neg Hx     Social History Social History  Substance Use Topics  . Smoking status: Former Smoker    Types: Pipe    Quit date: 07/10/1987  . Smokeless tobacco: Never Used  . Alcohol use Yes      Comment: occ 1-2 beers a month    Review of Systems Constitutional: No fever/chills Eyes: No visual changes. ENT: No sore throat. Cardiovascular: Denies chest pain. Respiratory: Denies shortness of breath. Gastrointestinal: No abdominal pain.  No nausea, no vomiting.  No diarrhea.  No constipation. Genitourinary: Negative for dysuria.Urinary retention (now resolved) Musculoskeletal: Negative for neck pain.  Negative for back pain. Integumentary: Negative for rash. Neurological: Negative for headaches, focal weakness or numbness.  ____________________________________________   PHYSICAL EXAM:  VITAL SIGNS: ED Triage Vitals  Enc Vitals Group     BP 07/17/17 2110 (!) 162/75     Pulse Rate 07/17/17 2110 91     Resp 07/17/17 2110 16     Temp 07/17/17 2110 98.1 F (36.7 C)     Temp Source 07/17/17  2110 Oral     SpO2 07/17/17 2110 95 %     Weight 07/17/17 2109 97.1 kg (214 lb)     Height 07/17/17 2109 1.727 m (5\' 8" )     Head Circumference --      Peak Flow --      Pain Score 07/17/17 2108 5     Pain Loc --      Pain Edu? --      Excl. in [place]? --     Constitutional: Alert and oriented. Well appearing and in no acute distress. Eyes: Conjunctivae are normal.  Head: Atraumatic. Gastrointestinal: Soft and nontender. No distention.  Genitourinary: Slight hematuria noted in Foley catheter actively draining. Musculoskeletal: No lower extremity tenderness nor edema. No gross deformities of extremities. Neurologic:  Normal speech and language. No gross focal neurologic deficits are appreciated.  Skin:  Skin is warm, dry and intact. No rash noted. Psychiatric: Mood and affect are normal. Speech and behavior are normal.  ____________________________________________   LABS (all labs ordered are listed, but only abnormal results are displayed)  Labs Reviewed  URINALYSIS, COMPLETE (UACMP) WITH MICROSCOPIC - Abnormal; Notable for the following:       Result Value   Color, Urine  AMBER (*)    APPearance HAZY (*)    Hgb urine dipstick LARGE (*)    Protein, ur 100 (*)    Leukocytes, UA SMALL (*)    All other components within normal limits  CBC     Procedures   ____________________________________________   INITIAL IMPRESSION / ASSESSMENT AND PLAN / ED COURSE  Pertinent labs & imaging results that were available during my care of the patient were reviewed by me and considered in my medical decision making (see chart for details).  Patient's catheter was irrigated with light hematuria actively draining. Patient with no discomfort at this time.      ____________________________________________  FINAL CLINICAL IMPRESSION(S) / ED DIAGNOSES  Final diagnoses:  Complication of Foley catheter, initial encounter ([place])     MEDICATIONS GIVEN DURING THIS VISIT:  Medications - No data to display   NEW OUTPATIENT MEDICATIONS STARTED DURING THIS VISIT:  New Prescriptions   No medications on file    Modified Medications   No medications on file    Discontinued Medications   No medications on file     Note:  This document was prepared using Dragon voice recognition software and may include unintentional dictation errors.    Gregor Hams, MD 07/18/17 [phone]

## 2017-07-20 DIAGNOSIS — N401 Enlarged prostate with lower urinary tract symptoms: Secondary | ICD-10-CM | POA: Diagnosis not present

## 2017-07-21 DIAGNOSIS — N401 Enlarged prostate with lower urinary tract symptoms: Secondary | ICD-10-CM | POA: Diagnosis not present

## 2017-07-21 DIAGNOSIS — R338 Other retention of urine: Secondary | ICD-10-CM | POA: Diagnosis not present

## 2017-07-30 DIAGNOSIS — H2511 Age-related nuclear cataract, right eye: Secondary | ICD-10-CM | POA: Diagnosis not present

## 2017-08-04 DIAGNOSIS — R42 Dizziness and giddiness: Secondary | ICD-10-CM | POA: Diagnosis not present

## 2017-08-04 DIAGNOSIS — M25561 Pain in right knee: Secondary | ICD-10-CM | POA: Diagnosis not present

## 2017-08-04 DIAGNOSIS — Z0000 Encounter for general adult medical examination without abnormal findings: Secondary | ICD-10-CM | POA: Diagnosis not present

## 2017-08-04 DIAGNOSIS — I10 Essential (primary) hypertension: Secondary | ICD-10-CM | POA: Diagnosis not present

## 2017-08-04 DIAGNOSIS — M25562 Pain in left knee: Secondary | ICD-10-CM | POA: Diagnosis not present

## 2017-08-04 DIAGNOSIS — E119 Type 2 diabetes mellitus without complications: Secondary | ICD-10-CM | POA: Diagnosis not present

## 2017-08-04 DIAGNOSIS — Z23 Encounter for immunization: Secondary | ICD-10-CM | POA: Diagnosis not present

## 2017-08-04 DIAGNOSIS — R05 Cough: Secondary | ICD-10-CM | POA: Diagnosis not present

## 2017-08-13 DIAGNOSIS — L812 Freckles: Secondary | ICD-10-CM | POA: Diagnosis not present

## 2017-08-13 DIAGNOSIS — L578 Other skin changes due to chronic exposure to nonionizing radiation: Secondary | ICD-10-CM | POA: Diagnosis not present

## 2017-08-13 DIAGNOSIS — L570 Actinic keratosis: Secondary | ICD-10-CM | POA: Diagnosis not present

## 2017-08-13 DIAGNOSIS — Z1283 Encounter for screening for malignant neoplasm of skin: Secondary | ICD-10-CM | POA: Diagnosis not present

## 2017-08-13 DIAGNOSIS — L821 Other seborrheic keratosis: Secondary | ICD-10-CM | POA: Diagnosis not present

## 2017-08-13 DIAGNOSIS — L720 Epidermal cyst: Secondary | ICD-10-CM | POA: Diagnosis not present

## 2017-08-13 DIAGNOSIS — L820 Inflamed seborrheic keratosis: Secondary | ICD-10-CM | POA: Diagnosis not present

## 2017-08-19 DIAGNOSIS — M1712 Unilateral primary osteoarthritis, left knee: Secondary | ICD-10-CM | POA: Diagnosis not present

## 2017-08-19 DIAGNOSIS — M170 Bilateral primary osteoarthritis of knee: Secondary | ICD-10-CM | POA: Diagnosis not present

## 2017-08-20 DIAGNOSIS — H2511 Age-related nuclear cataract, right eye: Secondary | ICD-10-CM | POA: Diagnosis not present

## 2017-08-21 NOTE — Discharge Instructions (Signed)

## 2017-08-25 ENCOUNTER — Encounter: Payer: Self-pay | Admitting: *Deleted

## 2017-08-26 ENCOUNTER — Ambulatory Visit: Payer: PPO | Admitting: Anesthesiology

## 2017-08-26 ENCOUNTER — Encounter
Admission: RE | Disposition: A | Discharge status: Home / Self Care | Payer: Self-pay | Source: Ambulatory Visit | Attending: Ophthalmology

## 2017-08-26 ENCOUNTER — Ambulatory Visit
Admission: RE | Admit: 2017-08-26 | Discharge: 2017-08-26 | Disposition: A | Discharge status: Home / Self Care | Payer: PPO | Source: Ambulatory Visit | Attending: Ophthalmology | Admitting: Ophthalmology

## 2017-08-26 DIAGNOSIS — Z981 Arthrodesis status: Secondary | ICD-10-CM | POA: Diagnosis not present

## 2017-08-26 DIAGNOSIS — Z87442 Personal history of urinary calculi: Secondary | ICD-10-CM | POA: Insufficient documentation

## 2017-08-26 DIAGNOSIS — M1990 Unspecified osteoarthritis, unspecified site: Secondary | ICD-10-CM | POA: Diagnosis not present

## 2017-08-26 DIAGNOSIS — E1136 Type 2 diabetes mellitus with diabetic cataract: Secondary | ICD-10-CM | POA: Diagnosis not present

## 2017-08-26 DIAGNOSIS — F329 Major depressive disorder, single episode, unspecified: Secondary | ICD-10-CM | POA: Diagnosis not present

## 2017-08-26 DIAGNOSIS — Z9849 Cataract extraction status, unspecified eye: Secondary | ICD-10-CM | POA: Insufficient documentation

## 2017-08-26 DIAGNOSIS — Z85820 Personal history of malignant melanoma of skin: Secondary | ICD-10-CM | POA: Diagnosis not present

## 2017-08-26 DIAGNOSIS — E7800 Pure hypercholesterolemia, unspecified: Secondary | ICD-10-CM | POA: Diagnosis not present

## 2017-08-26 DIAGNOSIS — Z888 Allergy status to other drugs, medicaments and biological substances status: Secondary | ICD-10-CM | POA: Diagnosis not present

## 2017-08-26 DIAGNOSIS — I10 Essential (primary) hypertension: Secondary | ICD-10-CM | POA: Diagnosis not present

## 2017-08-26 DIAGNOSIS — H2511 Age-related nuclear cataract, right eye: Secondary | ICD-10-CM | POA: Insufficient documentation

## 2017-08-26 DIAGNOSIS — N400 Enlarged prostate without lower urinary tract symptoms: Secondary | ICD-10-CM | POA: Insufficient documentation

## 2017-08-26 DIAGNOSIS — Z85828 Personal history of other malignant neoplasm of skin: Secondary | ICD-10-CM | POA: Diagnosis not present

## 2017-08-26 HISTORY — DX: Dyspnea, unspecified: R06.00

## 2017-08-26 HISTORY — PX: CATARACT EXTRACTION W/PHACO: SHX586

## 2017-08-26 LAB — GLUCOSE, CAPILLARY
GLUCOSE-CAPILLARY: 130 mg/dL — AB (ref 65–99)
GLUCOSE-CAPILLARY: 142 mg/dL — AB (ref 65–99)

## 2017-08-26 SURGERY — PHACOEMULSIFICATION, CATARACT, WITH IOL INSERTION
Anesthesia: Monitor Anesthesia Care | Laterality: Right | Wound class: Clean

## 2017-08-26 MED ORDER — BRIMONIDINE TARTRATE-TIMOLOL 0.2-0.5 % OP SOLN
OPHTHALMIC | Status: DC | PRN
  Administered 2017-08-26: 1 [drp] via OPHTHALMIC

## 2017-08-26 MED ORDER — MOXIFLOXACIN HCL 0.5 % OP SOLN
1.0000 [drp] | OPHTHALMIC | Status: DC | PRN
  Administered 2017-08-26 (×3): 1 [drp] via OPHTHALMIC

## 2017-08-26 MED ORDER — NA HYALUR & NA CHOND-NA HYALUR 0.4-0.35 ML IO KIT
PACK | INTRAOCULAR | Status: DC | PRN
  Administered 2017-08-26: 1 mL via INTRAOCULAR

## 2017-08-26 MED ORDER — ACETAMINOPHEN 325 MG PO TABS: 650.0000 mg | ORAL_TABLET | Freq: Once | ORAL | Status: DC | PRN

## 2017-08-26 MED ORDER — ONDANSETRON HCL 4 MG/2ML IJ SOLN: 4.0000 mg | Freq: Once | INTRAMUSCULAR | Status: DC | PRN

## 2017-08-26 MED ORDER — ACETAMINOPHEN 160 MG/5ML PO SOLN: 325.0000 mg | ORAL | Status: DC | PRN

## 2017-08-26 MED ORDER — ARMC OPHTHALMIC DILATING DROPS
1.0000 "application " | OPHTHALMIC | Status: DC | PRN
  Administered 2017-08-26 (×2): 1 via OPHTHALMIC

## 2017-08-26 MED ORDER — LACTATED RINGERS IV SOLN: INTRAVENOUS | Status: DC

## 2017-08-26 MED ORDER — EPINEPHRINE PF 1 MG/ML IJ SOLN
INTRAMUSCULAR | Status: DC | PRN
  Administered 2017-08-26: 55 mL via OPHTHALMIC

## 2017-08-26 MED ORDER — LIDOCAINE HCL (PF) 2 % IJ SOLN
INTRAOCULAR | Status: DC | PRN
  Administered 2017-08-26: 1 mL via INTRAOCULAR

## 2017-08-26 MED ORDER — CEFUROXIME OPHTHALMIC INJECTION 1 MG/0.1 ML
INJECTION | OPHTHALMIC | Status: DC | PRN
  Administered 2017-08-26: 0.1 mL via OPHTHALMIC

## 2017-08-26 MED ORDER — MIDAZOLAM HCL 2 MG/2ML IJ SOLN
INTRAMUSCULAR | Status: DC | PRN
  Administered 2017-08-26: 2 mg via INTRAVENOUS

## 2017-08-26 MED ORDER — FENTANYL CITRATE (PF) 100 MCG/2ML IJ SOLN
INTRAMUSCULAR | Status: DC | PRN
Start: 2017-08-26 — End: 2017-08-26
  Administered 2017-08-26: 50 ug via INTRAVENOUS

## 2017-08-26 SURGICAL SUPPLY — 25 items
CANNULA ANT/CHMB 27GA (MISCELLANEOUS) ×2 IMPLANT
CARTRIDGE ABBOTT (MISCELLANEOUS) IMPLANT
GLOVE SURG LX 7.5 STRW (GLOVE) ×1
GLOVE SURG LX STRL 7.5 STRW (GLOVE) ×1 IMPLANT
GLOVE SURG TRIUMPH 8.0 PF LTX (GLOVE) ×2 IMPLANT
GOWN STRL REUS W/ TWL LRG LVL3 (GOWN DISPOSABLE) ×2 IMPLANT
GOWN STRL REUS W/TWL LRG LVL3 (GOWN DISPOSABLE) ×2
LENS IOL TECNIS ITEC 22.5 (Intraocular Lens) ×2 IMPLANT
MARKER SKIN DUAL TIP RULER LAB (MISCELLANEOUS) ×2 IMPLANT
NDL RETROBULBAR .5 NSTRL (NEEDLE) IMPLANT
NEEDLE FILTER BLUNT 18X 1/2SAF (NEEDLE) ×1
NEEDLE FILTER BLUNT 18X1 1/2 (NEEDLE) ×1 IMPLANT
PACK CATARACT BRASINGTON (MISCELLANEOUS) ×2 IMPLANT
PACK EYE AFTER SURG (MISCELLANEOUS) ×2 IMPLANT
PACK OPTHALMIC (MISCELLANEOUS) ×2 IMPLANT
RING MALYGIN 7.0 (MISCELLANEOUS) IMPLANT
SUT ETHILON 10-0 CS-B-6CS-B-6 (SUTURE)
SUT VICRYL  9 0 (SUTURE)
SUT VICRYL 9 0 (SUTURE) IMPLANT
SUTURE EHLN 10-0 CS-B-6CS-B-6 (SUTURE) IMPLANT
SYR 3ML LL SCALE MARK (SYRINGE) ×2 IMPLANT
SYR 5ML LL (SYRINGE) ×2 IMPLANT
SYR TB 1ML LUER SLIP (SYRINGE) ×2 IMPLANT
WATER STERILE IRR 250ML POUR (IV SOLUTION) ×2 IMPLANT
WIPE NON LINTING 3.25X3.25 (MISCELLANEOUS) ×2 IMPLANT

## 2017-08-26 NOTE — Transfer of Care (Signed)
Immediate Anesthesia Transfer of Care Note  Patient: Gary Chang  Procedure(s) Performed: Procedure(s) with comments: CATARACT EXTRACTION PHACO AND INTRAOCULAR LENS PLACEMENT (IOC) RIGHT (Right) - IVA TOPICAL DIABETES - oral meds  Patient Location: PACU  Anesthesia Type: MAC  Level of Consciousness: awake, alert  and patient cooperative  Airway and Oxygen Therapy: Patient Spontanous Breathing and Patient connected to supplemental oxygen  Post-op Assessment: Post-op Vital signs reviewed, Patient's Cardiovascular Status Stable, Respiratory Function Stable, Patent Airway and No signs of Nausea or vomiting  Post-op Vital Signs: Reviewed and stable  Complications: No apparent anesthesia complications

## 2017-08-26 NOTE — Transfer of Care (Signed)
Immediate Anesthesia Transfer of Care Note  Patient: Gary Chang  Procedure(s) Performed: Procedure(s) with comments: CATARACT EXTRACTION PHACO AND INTRAOCULAR LENS PLACEMENT (IOC) RIGHT (Right) - IVA TOPICAL DIABETES - oral meds  Patient Location: PACU  Anesthesia Type: MAC  Level of Consciousness: awake, alert  and patient cooperative  Airway and Oxygen Therapy: Patient Spontanous Breathing and Patient connected to supplemental oxygen  Post-op Assessment: Post-op Vital signs reviewed, Patient's Cardiovascular Status Stable, Respiratory Function Stable, Patent Airway and No signs of Nausea or vomiting  Post-op Vital Signs: Reviewed and stable  Complications: No apparent anesthesia complications  

## 2017-08-26 NOTE — Anesthesia Procedure Notes (Signed)
Procedure Name: MAC Performed by: Mayme Genta Pre-anesthesia Checklist: Patient identified, Emergency Drugs available, Suction available, Timeout performed and Patient being monitored Patient Re-evaluated:Patient Re-evaluated prior to induction Oxygen Delivery Method: Nasal cannula Placement Confirmation: positive ETCO2

## 2017-08-26 NOTE — Op Note (Signed)
LOCATION:  [place]   PREOPERATIVE DIAGNOSIS:    Nuclear sclerotic cataract right eye. H25.11   POSTOPERATIVE DIAGNOSIS:  Nuclear sclerotic cataract right eye.     PROCEDURE:  Phacoemusification with posterior chamber intraocular lens placement of the right eye   LENS:   Implant Name Type Inv. Item Serial No. Manufacturer Lot No. LRB No. Used  LENS IOL DIOP 22.5 - [id number] Intraocular Lens LENS IOL DIOP 22.5 [id number] AMO   Right 1        ULTRASOUND TIME: 17 % of 0 minutes, 47 seconds.  CDE 8.0   SURGEON:  Wyonia Hough, MD   ANESTHESIA:  Topical with tetracaine drops and 2% Xylocaine jelly, augmented with 1% preservative-free intracameral lidocaine.    COMPLICATIONS:  None.   DESCRIPTION OF PROCEDURE:  The patient was identified in the holding room and transported to the operating room and placed in the supine position under the operating microscope.  The right eye was identified as the operative eye and it was prepped and draped in the usual sterile ophthalmic fashion.   A 1 millimeter clear-corneal paracentesis was made at the 12:00 position.  0.5 ml of preservative-free 1% lidocaine was injected into the anterior chamber. The anterior chamber was filled with Viscoat viscoelastic.  A 2.4 millimeter keratome was used to make a near-clear corneal incision at the 9:00 position.  A curvilinear capsulorrhexis was made with a cystotome and capsulorrhexis forceps.  Balanced salt solution was used to hydrodissect and hydrodelineate the nucleus.   Phacoemulsification was then used in stop and chop fashion to remove the lens nucleus and epinucleus.  The remaining cortex was then removed using the irrigation and aspiration handpiece. Provisc was then placed into the capsular bag to distend it for lens placement.  A lens was then injected into the capsular bag.  The remaining viscoelastic was aspirated.   Wounds were hydrated with balanced salt solution.  The anterior  chamber was inflated to a physiologic pressure with balanced salt solution.  No wound leaks were noted. Cefuroxime 0.1 ml of a 10mg /ml solution was injected into the anterior chamber for a dose of 1 mg of intracameral antibiotic at the completion of the case.   Timolol and Brimonidine drops were applied to the eye.  The patient was taken to the recovery room in stable condition without complications of anesthesia or surgery.   Nickolis Diel 08/26/2017, 11:25 AM

## 2017-08-26 NOTE — Anesthesia Preprocedure Evaluation (Addendum)
Anesthesia Evaluation  Patient identified by MRN, date of birth, ID band Patient awake    Reviewed: Allergy & Precautions, NPO status , Patient's Chart, lab work & pertinent test results  Airway Mallampati: II  TM Distance: >3 FB     Dental   Pulmonary shortness of breath, COPD, former smoker,    breath sounds clear to auscultation       Cardiovascular hypertension, + Peripheral Vascular Disease   Rhythm:Regular Rate:Normal     Neuro/Psych  Headaches, Anxiety Depression    GI/Hepatic hiatal hernia, GERD  ,  Endo/Other  diabetes  Renal/GU      Musculoskeletal  (+) Arthritis ,   Abdominal   Peds  Hematology   Anesthesia Other Findings   Reproductive/Obstetrics                            Anesthesia Physical Anesthesia Plan  ASA: III  Anesthesia Plan: MAC   Post-op Pain Management:    Induction:   PONV Risk Score and Plan:   Airway Management Planned:   Additional Equipment:   Intra-op Plan:   Post-operative Plan:   Informed Consent:   Plan Discussed with:   Anesthesia Plan Comments:         Anesthesia Quick Evaluation

## 2017-08-26 NOTE — Anesthesia Postprocedure Evaluation (Signed)
Anesthesia Post Note  Patient: Gary Chang  Procedure(s) Performed: Procedure(s) (LRB): CATARACT EXTRACTION PHACO AND INTRAOCULAR LENS PLACEMENT (IOC) RIGHT (Right)  Patient location during evaluation: PACU Anesthesia Type: MAC Level of consciousness: awake Pain management: pain level controlled Vital Signs Assessment: post-procedure vital signs reviewed and stable Respiratory status: spontaneous breathing, nonlabored ventilation and respiratory function stable Cardiovascular status: stable Postop Assessment: no signs of nausea or vomiting Anesthetic complications: no    Veda Canning

## 2017-08-26 NOTE — H&P (Signed)
The History and Physical notes are on paper, have been signed, and are to be scanned. The patient remains stable and unchanged from the H&P.   Previous H&P reviewed, patient examined, and there are no changes.  Gary Chang 08/26/2017 10:19 AM

## 2017-08-27 ENCOUNTER — Encounter: Payer: Self-pay | Admitting: Ophthalmology

## 2017-09-01 DIAGNOSIS — H6123 Impacted cerumen, bilateral: Secondary | ICD-10-CM | POA: Diagnosis not present

## 2017-09-01 DIAGNOSIS — H903 Sensorineural hearing loss, bilateral: Secondary | ICD-10-CM | POA: Diagnosis not present

## 2017-09-01 DIAGNOSIS — R05 Cough: Secondary | ICD-10-CM | POA: Diagnosis not present

## 2017-09-02 ENCOUNTER — Other Ambulatory Visit: Payer: Self-pay | Admitting: Unknown Physician Specialty

## 2017-09-02 DIAGNOSIS — R05 Cough: Secondary | ICD-10-CM

## 2017-09-02 DIAGNOSIS — R059 Cough, unspecified: Secondary | ICD-10-CM

## 2017-10-02 ENCOUNTER — Ambulatory Visit: Admission: RE | Admit: 2017-10-02 | Payer: PPO | Source: Ambulatory Visit

## 2017-10-26 DIAGNOSIS — L821 Other seborrheic keratosis: Secondary | ICD-10-CM | POA: Diagnosis not present

## 2017-10-26 DIAGNOSIS — L570 Actinic keratosis: Secondary | ICD-10-CM | POA: Diagnosis not present

## 2017-10-26 DIAGNOSIS — L578 Other skin changes due to chronic exposure to nonionizing radiation: Secondary | ICD-10-CM | POA: Diagnosis not present

## 2017-10-26 DIAGNOSIS — L820 Inflamed seborrheic keratosis: Secondary | ICD-10-CM | POA: Diagnosis not present

## 2017-10-26 DIAGNOSIS — Z85828 Personal history of other malignant neoplasm of skin: Secondary | ICD-10-CM | POA: Diagnosis not present

## 2017-10-27 DIAGNOSIS — N401 Enlarged prostate with lower urinary tract symptoms: Secondary | ICD-10-CM | POA: Diagnosis not present

## 2017-11-02 DIAGNOSIS — R350 Frequency of micturition: Secondary | ICD-10-CM | POA: Diagnosis not present

## 2017-11-02 DIAGNOSIS — N401 Enlarged prostate with lower urinary tract symptoms: Secondary | ICD-10-CM | POA: Diagnosis not present

## 2017-11-02 DIAGNOSIS — R351 Nocturia: Secondary | ICD-10-CM | POA: Diagnosis not present

## 2017-11-10 DIAGNOSIS — M542 Cervicalgia: Secondary | ICD-10-CM | POA: Diagnosis not present

## 2017-11-10 DIAGNOSIS — R51 Headache: Secondary | ICD-10-CM | POA: Diagnosis not present

## 2017-11-10 DIAGNOSIS — R42 Dizziness and giddiness: Secondary | ICD-10-CM | POA: Diagnosis not present

## 2017-11-10 DIAGNOSIS — G8929 Other chronic pain: Secondary | ICD-10-CM | POA: Diagnosis not present

## 2017-11-10 DIAGNOSIS — I10 Essential (primary) hypertension: Secondary | ICD-10-CM | POA: Diagnosis not present

## 2017-11-10 DIAGNOSIS — R109 Unspecified abdominal pain: Secondary | ICD-10-CM | POA: Diagnosis not present

## 2017-11-10 DIAGNOSIS — R000 Tachycardia, unspecified: Secondary | ICD-10-CM | POA: Diagnosis not present

## 2017-11-17 ENCOUNTER — Other Ambulatory Visit: Payer: Self-pay | Admitting: Unknown Physician Specialty

## 2017-11-17 ENCOUNTER — Ambulatory Visit
Admission: RE | Admit: 2017-11-17 | Discharge: 2017-11-17 | Disposition: A | Discharge status: Home / Self Care | Payer: PPO | Source: Ambulatory Visit | Attending: Unknown Physician Specialty | Admitting: Unknown Physician Specialty

## 2017-11-17 DIAGNOSIS — I700 Atherosclerosis of aorta: Secondary | ICD-10-CM | POA: Insufficient documentation

## 2017-11-17 DIAGNOSIS — R05 Cough: Secondary | ICD-10-CM | POA: Diagnosis not present

## 2017-11-17 DIAGNOSIS — R059 Cough, unspecified: Secondary | ICD-10-CM

## 2017-11-17 DIAGNOSIS — H6123 Impacted cerumen, bilateral: Secondary | ICD-10-CM | POA: Diagnosis not present

## 2017-11-25 DIAGNOSIS — I480 Paroxysmal atrial fibrillation: Secondary | ICD-10-CM | POA: Diagnosis not present

## 2017-11-25 DIAGNOSIS — R000 Tachycardia, unspecified: Secondary | ICD-10-CM | POA: Diagnosis not present

## 2017-11-25 DIAGNOSIS — I4891 Unspecified atrial fibrillation: Secondary | ICD-10-CM | POA: Diagnosis not present

## 2017-11-25 DIAGNOSIS — E782 Mixed hyperlipidemia: Secondary | ICD-10-CM | POA: Diagnosis not present

## 2017-11-25 DIAGNOSIS — I10 Essential (primary) hypertension: Secondary | ICD-10-CM | POA: Diagnosis not present

## 2017-11-25 DIAGNOSIS — R0602 Shortness of breath: Secondary | ICD-10-CM | POA: Diagnosis not present

## 2017-12-02 ENCOUNTER — Ambulatory Visit: Payer: PPO

## 2017-12-04 ENCOUNTER — Ambulatory Visit: Payer: PPO

## 2017-12-08 DIAGNOSIS — E1140 Type 2 diabetes mellitus with diabetic neuropathy, unspecified: Secondary | ICD-10-CM | POA: Diagnosis not present

## 2017-12-08 DIAGNOSIS — E782 Mixed hyperlipidemia: Secondary | ICD-10-CM | POA: Diagnosis not present

## 2017-12-08 DIAGNOSIS — R0602 Shortness of breath: Secondary | ICD-10-CM | POA: Diagnosis not present

## 2017-12-08 DIAGNOSIS — I480 Paroxysmal atrial fibrillation: Secondary | ICD-10-CM | POA: Diagnosis not present

## 2017-12-08 DIAGNOSIS — I10 Essential (primary) hypertension: Secondary | ICD-10-CM | POA: Diagnosis not present

## 2017-12-09 ENCOUNTER — Ambulatory Visit
Admission: RE | Admit: 2017-12-09 | Discharge: 2017-12-09 | Disposition: A | Discharge status: Home / Self Care | Payer: PPO | Source: Ambulatory Visit | Attending: Unknown Physician Specialty | Admitting: Unknown Physician Specialty

## 2017-12-09 DIAGNOSIS — R05 Cough: Secondary | ICD-10-CM | POA: Insufficient documentation

## 2017-12-09 DIAGNOSIS — R1310 Dysphagia, unspecified: Secondary | ICD-10-CM | POA: Diagnosis not present

## 2017-12-09 DIAGNOSIS — R1314 Dysphagia, pharyngoesophageal phase: Secondary | ICD-10-CM | POA: Insufficient documentation

## 2017-12-09 DIAGNOSIS — R059 Cough, unspecified: Secondary | ICD-10-CM

## 2017-12-09 NOTE — Therapy (Addendum)
[place], [place], [address] Phone: [phone]   Fax:     Modified Barium Swallow  Patient Details  Name: Gary Chang MRN: [id number] Date of Birth: [birth year] No Data Recorded  Encounter Date: 12/09/2017  End of Session - 12/09/17 1600    Visit Number  1    Number of Visits  1    Date for SLP Re-Evaluation  12/09/17    SLP Start Time  1030    SLP Stop Time   1145    SLP Time Calculation (min)  75 min    Activity Tolerance  Patient tolerated treatment well       Past Medical History:  Diagnosis Date  . Abdominal aortic aneurysm (AAA) ([place])   . Allergic state   . Anxiety   . Arthritis   . Cancer ([place])    skin ca removed  . COPD (chronic obstructive pulmonary disease) ([place])   . Depression   . Diabetes mellitus    tingling in feet  . Dyspnea    with exertion  . Enlarged prostate    laser surgery  . GERD (gastroesophageal reflux disease)   . H/O hiatal hernia   . High cholesterol   . Hypertension    pcp  dr Jeneen Rinks [place]  [place]  . Kidney stones   . Melanoma ([place])   . Pneumonia   . Renal artery aneurysm [facility])     Past Surgical History:  Procedure Laterality Date  . ABDOMINAL AORTIC ANEURYSM REPAIR    . BACK SURGERY     lumbar  ,cervical surgeries  . CATARACT EXTRACTION W/PHACO Left 12/11/2016   Procedure: CATARACT EXTRACTION PHACO AND INTRAOCULAR LENS PLACEMENT (IOC);  Surgeon: Leandrew Koyanagi, MD;  Location: [facility] ORS;  Service: Ophthalmology;  Laterality: Left;  [place] 1:17 AP% 17.7 CDE 13.8 FLUID PACK LOT # [id number]  . CATARACT EXTRACTION W/PHACO Right 08/26/2017   Procedure: CATARACT EXTRACTION PHACO AND INTRAOCULAR LENS PLACEMENT ([place]) RIGHT;  Surgeon: Leandrew Koyanagi, MD;  Location: [facility];  Service: Ophthalmology;  Laterality: Right;  IVA TOPICAL DIABETES - oral meds  . ESOPHAGOGASTRODUODENOSCOPY (EGD) WITH PROPOFOL N/A 02/11/2016    Procedure: ESOPHAGOGASTRODUODENOSCOPY (EGD) WITH PROPOFOL;  Surgeon: Hulen Luster, MD;  Location[facility];  Service: Gastroenterology;  Laterality: N/A;  . EYE SURGERY     right cataract  . GREEN LIGHT LASER TURP (TRANSURETHRAL RESECTION OF PROSTATE N/A 07/14/2017   Procedure: GREEN LIGHT LASER TURP (TRANSURETHRAL RESECTION OF PROSTATE;  Surgeon: Royston Cowper, MD;  Location: [facility] ORS;  Service: Urology;  Laterality: N/A;  . HERNIA REPAIR     inguinal  . KIDNEY STONE SURGERY    . LUMBAR LAMINECTOMY/DECOMPRESSION MICRODISCECTOMY  12/10/2011   Procedure: LUMBAR LAMINECTOMY/DECOMPRESSION MICRODISCECTOMY;  Surgeon: Ophelia Charter;  Location: [facility]UR[facility];  Service: Neurosurgery;;  Thoracic One-Two Laminectomy    There were no vitals filed for this visit.      Subjective: Patient behavior: (alertness, ability to follow instructions, etc.): pt A/O x3; able to follow instructions and answer questions re: self. Verbally conversive. Chief complaint: dysphagia. Pt has a h/o "acid reflux which doesn't bother me too much anymore:" - no longer taking a PPI; does have a h/o hiatal hernia per chart(currently?). Pt c/o a significant dry, hacking cough especially at night - he recently began Eliquis (medication for blood pressure) and wonders if this is contributing to his cough. Pt and family report no significant issues re:  swallowing; no pneumonia in several years per pt. Pt does take over the counter meds for reflux.   Objective:  Radiological Procedure: A videoflouroscopic evaluation of oral-preparatory, reflex initiation, and pharyngeal phases of the swallow was performed; as well as a screening of the upper esophageal phase.  I. POSTURE:  Upright  II. VIEW:  Lateral, A-P III. COMPENSATORY STRATEGIES:  None indicated IV. BOLUSES ADMINISTERED:  Thin Liquid: 6 trials  Nectar-thick Liquid: 1 trial  Honey-thick Liquid: NT  Puree: 3 trials  Mechanical Soft: 3 trials V. RESULTS OF  EVALUATION: A. ORAL PREPARATORY PHASE: (The lips, tongue, and velum are observed for strength and coordination)       **Overall Severity Rating: WFL. Pt exhibited adequate bolus management and control w/ grossly appropriate timing of A-P transfer of all bolus consistencies for swallowing. Full oral clearing noted post all trial consistencies.   B. SWALLOW INITIATION/REFLEX: (The reflex is normal if "triggered" by the time the bolus reached the base of the tongue)  **Overall Severity Rating: [facility]. Pt demonstrated timely pharyngeal swallow initiation was noted w/ all trial consistencies; appropriate airway closure w/ no laryngeal penetration or aspiration occurring.  C. PHARYNGEAL PHASE: (Pharyngeal function is normal if the bolus shows rapid, smooth, and continuous transit through the pharynx and there is no pharyngeal residue after the swallow)  **Overall Severity Rating: [facility]. Full pharyngeal clearing of all bolus trial consistencies post swallowing indicating adequate pharyngeal pressure and laryngeal excursion during the swallowing. No pharyngeal residue remained.  D. LARYNGEAL PENETRATION: (Material entering into the laryngeal inlet/vestibule but not aspirated): NONE E. ASPIRATION: NONE F. ESOPHAGEAL PHASE: (Screening of the upper esophagus): slower movement of bolus material through the lower cervical to mid-Esophagus - Radiologist indicated [place]. This dysmotility did not significantly increase and appeared to clear given time and when alternating w/ liquids. Of note, pt does have cervical fusion (Surgery w/ hardware noted) - slower bolus motility through this area occurred w/ increased textured/food trials.   G. ASSESSMENT: Pt appeared to present w/ an adequate oropharyngeal phase swallow function during this study w/ no laryngeal penetration or aspiration noted w/ any trial consistencies. During the oral phase, pt exhibited adequate bolus management and control w/ grossly appropriate  timing of A-P transfer of all bolus consistencies for swallowing - min increased mastication time w increased texture(slightly more of a munching pattern during chewing?). Full oral clearing occurred post all trial consistencies. During the pharyngeal phase, timely pharyngeal swallow initiation was noted w/ all trial consistencies; appropriate airway closure w/ no laryngeal penetration or aspiration occurring. Full pharyngeal clearing of all bolus trial consistencies post swallowing indicating adequate pharyngeal pressure and laryngeal excursion during the swallowing. No pharyngeal residue remained. During the Esophageal phase of swallowing, slower movement of bolus material through the lower cervical to mid-Esophagus - Radiologist indicated [place]. This dysmotility did not significantly increase and appeared to clear given Time and when Alternating w/ liquids. This Esophageal dysmotility could contribute to pt's c/o dry cough after meals if reflux behavior is occurring. Of note, pt does have cervical fusion (surgery w/ hardware noted) - slower bolus motility through this area occurred w/ increased textured/food trials but appeared to clear adequately w/ time b/t trials.     PLAN/RECOMMENDATIONS:  A. Diet: Regular diet w/ Meats cut and moistened well; Thin liquids. Pills in puree for easier swallowing if needed.   B. Swallowing Precautions: general aspiration precautions; general Reflux precautions  C. Recommended consultation to: GI for any further assessment of Esophageal dysmotility and Reflux  management/education; discussion w/ Cardiologist re: Eliquis medication  D. Therapy recommendations:  None   E. Results and recommendations were discussed w/ patient and family; video viewed; handouts given; questions answered        Dysphagia, pharyngoesophageal phase  Cough - Plan: DG OP Swallowing Func-Medicare/Speech Path, DG OP Swallowing Func-Medicare/Speech Path  G-Codes - December 26, 2017 1601     Functional Assessment Tool Used  clinical judgement    Functional Limitations  Swallowing    Swallow Current Status (Y8657)  At least 1 percent but less than 20 percent impaired, limited or restricted    Swallow Goal Status (Q4696)  At least 1 percent but less than 20 percent impaired, limited or restricted    Swallow Discharge Status [id number])  At least 1 percent but less than 20 percent impaired, limited or restricted           Problem List Patient Active Problem List   Diagnosis Date Noted  . Abdominal pain 01/30/2017  . Ventral hernia without obstruction or gangrene 01/30/2017  . Tension headache 04/30/2016  . Atypical facial pain 04/30/2016  . Left-sided weakness   . Occlusion and stenosis of basilar artery   . Vertebral artery stenosis   . Basilar artery stenosis 02/28/2016  . Dizziness and giddiness 11/29/2013  . Abnormality of gait 11/29/2013  . Diabetic neuropathy ([place]) 11/29/2013  . Memory loss 11/29/2013  . Headache, paroxysmal hemicrania, episodic 11/29/2013       Orinda Kenner, MS, CCC-SLP [place] Demaria 2017/12/26, 4:02 PM  [facility] [place], [place], [address] Phone: [phone]   Fax:     Name: EDGARDO PETRENKO MRN: [id number] Date of Birth: 09-Feb-1928

## 2017-12-10 DIAGNOSIS — J040 Acute laryngitis: Secondary | ICD-10-CM | POA: Diagnosis not present

## 2017-12-10 DIAGNOSIS — I480 Paroxysmal atrial fibrillation: Secondary | ICD-10-CM | POA: Diagnosis not present

## 2017-12-10 DIAGNOSIS — Z7901 Long term (current) use of anticoagulants: Secondary | ICD-10-CM | POA: Diagnosis not present

## 2017-12-12 ENCOUNTER — Emergency Department: Payer: PPO

## 2017-12-12 ENCOUNTER — Emergency Department
Admission: EM | Admit: 2017-12-12 | Discharge: 2017-12-12 | Disposition: A | Discharge status: Home / Self Care | Payer: PPO | Attending: Emergency Medicine | Admitting: Emergency Medicine

## 2017-12-12 DIAGNOSIS — Z7982 Long term (current) use of aspirin: Secondary | ICD-10-CM | POA: Diagnosis not present

## 2017-12-12 DIAGNOSIS — Z7984 Long term (current) use of oral hypoglycemic drugs: Secondary | ICD-10-CM | POA: Diagnosis not present

## 2017-12-12 DIAGNOSIS — Z85828 Personal history of other malignant neoplasm of skin: Secondary | ICD-10-CM | POA: Insufficient documentation

## 2017-12-12 DIAGNOSIS — Z79899 Other long term (current) drug therapy: Secondary | ICD-10-CM | POA: Diagnosis not present

## 2017-12-12 DIAGNOSIS — R5383 Other fatigue: Secondary | ICD-10-CM | POA: Diagnosis not present

## 2017-12-12 DIAGNOSIS — R05 Cough: Secondary | ICD-10-CM | POA: Diagnosis not present

## 2017-12-12 DIAGNOSIS — I10 Essential (primary) hypertension: Secondary | ICD-10-CM | POA: Insufficient documentation

## 2017-12-12 DIAGNOSIS — J040 Acute laryngitis: Secondary | ICD-10-CM

## 2017-12-12 DIAGNOSIS — J449 Chronic obstructive pulmonary disease, unspecified: Secondary | ICD-10-CM | POA: Insufficient documentation

## 2017-12-12 DIAGNOSIS — Z7901 Long term (current) use of anticoagulants: Secondary | ICD-10-CM | POA: Diagnosis not present

## 2017-12-12 DIAGNOSIS — Z87891 Personal history of nicotine dependence: Secondary | ICD-10-CM | POA: Diagnosis not present

## 2017-12-12 DIAGNOSIS — R0602 Shortness of breath: Secondary | ICD-10-CM | POA: Insufficient documentation

## 2017-12-12 DIAGNOSIS — E1140 Type 2 diabetes mellitus with diabetic neuropathy, unspecified: Secondary | ICD-10-CM | POA: Diagnosis not present

## 2017-12-12 DIAGNOSIS — J40 Bronchitis, not specified as acute or chronic: Secondary | ICD-10-CM | POA: Insufficient documentation

## 2017-12-12 DIAGNOSIS — R0600 Dyspnea, unspecified: Secondary | ICD-10-CM | POA: Diagnosis not present

## 2017-12-12 DIAGNOSIS — R490 Dysphonia: Secondary | ICD-10-CM | POA: Diagnosis not present

## 2017-12-12 LAB — CBC WITH DIFFERENTIAL/PLATELET
BASOS ABS: 0 10*3/uL (ref 0–0.1)
Basophils Relative: 0 %
Eosinophils Absolute: 0 10*3/uL (ref 0–0.7)
Eosinophils Relative: 0 %
HEMATOCRIT: 37.3 % — AB (ref 40.0–52.0)
HEMOGLOBIN: 11.9 g/dL — AB (ref 13.0–18.0)
LYMPHS PCT: 5 %
Lymphs Abs: 0.6 10*3/uL — ABNORMAL LOW (ref 1.0–3.6)
MCH: 25.7 pg — ABNORMAL LOW (ref 26.0–34.0)
MCHC: 31.9 g/dL — ABNORMAL LOW (ref 32.0–36.0)
MCV: 80.6 fL (ref 80.0–100.0)
Monocytes Absolute: 1.9 10*3/uL — ABNORMAL HIGH (ref 0.2–1.0)
Monocytes Relative: 14 %
NEUTROS ABS: 11.2 10*3/uL — AB (ref 1.4–6.5)
NEUTROS PCT: 81 %
Platelets: 210 10*3/uL (ref 150–440)
RBC: 4.63 MIL/uL (ref 4.40–5.90)
RDW: 15.1 % — ABNORMAL HIGH (ref 11.5–14.5)
WBC: 13.7 10*3/uL — AB (ref 3.8–10.6)

## 2017-12-12 LAB — BASIC METABOLIC PANEL
ANION GAP: 9 (ref 5–15)
BUN: 17 mg/dL (ref 6–20)
CO2: 25 mmol/L (ref 22–32)
Calcium: 8.5 mg/dL — ABNORMAL LOW (ref 8.9–10.3)
Chloride: 96 mmol/L — ABNORMAL LOW (ref 101–111)
Creatinine, Ser: 1.27 mg/dL — ABNORMAL HIGH (ref 0.61–1.24)
GFR calc Af Amer: 57 mL/min — ABNORMAL LOW (ref >60)
GFR, EST NON AFRICAN AMERICAN: 49 mL/min — AB (ref >60)
GLUCOSE: 168 mg/dL — AB (ref 65–99)
POTASSIUM: 3.4 mmol/L — AB (ref 3.5–5.1)
Sodium: 130 mmol/L — ABNORMAL LOW (ref 135–145)

## 2017-12-12 LAB — BRAIN NATRIURETIC PEPTIDE: B Natriuretic Peptide: 329 pg/mL — ABNORMAL HIGH (ref 0.0–100.0)

## 2017-12-12 LAB — TROPONIN I
TROPONIN I: 0.04 ng/mL — AB (ref <0.03)
Troponin I: 0.03 ng/mL (ref <0.03)

## 2017-12-12 MED ORDER — AZITHROMYCIN 500 MG PO TABS
500.0000 mg | ORAL_TABLET | Freq: Once | ORAL | Status: AC
  Administered 2017-12-12: 500 mg via ORAL
  Filled 2017-12-12: qty 1

## 2017-12-12 MED ORDER — BENZONATATE 100 MG PO CAPS
100.0000 mg | ORAL_CAPSULE | Freq: Once | ORAL | Status: AC
  Administered 2017-12-12: 100 mg via ORAL

## 2017-12-12 MED ORDER — ALBUTEROL SULFATE HFA 108 (90 BASE) MCG/ACT IN AERS: 2.0000 | INHALATION_SPRAY | Freq: Four times a day (QID) | RESPIRATORY_TRACT | 0 refills | Status: DC | PRN

## 2017-12-12 MED ORDER — ALBUTEROL SULFATE (2.5 MG/3ML) 0.083% IN NEBU
5.0000 mg | INHALATION_SOLUTION | Freq: Once | RESPIRATORY_TRACT | Status: DC
  Filled 2017-12-12: qty 6

## 2017-12-12 MED ORDER — BENZONATATE 100 MG PO CAPS: 100.0000 mg | ORAL_CAPSULE | Freq: Three times a day (TID) | ORAL | 0 refills | Status: DC | PRN

## 2017-12-12 MED ORDER — IPRATROPIUM-ALBUTEROL 0.5-2.5 (3) MG/3ML IN SOLN
3.0000 mL | Freq: Once | RESPIRATORY_TRACT | Status: AC
  Administered 2017-12-12: 3 mL via RESPIRATORY_TRACT
  Filled 2017-12-12: qty 9

## 2017-12-12 MED ORDER — AZITHROMYCIN 250 MG PO TABS: ORAL_TABLET | ORAL | 0 refills | Status: DC

## 2017-12-12 MED ORDER — BENZONATATE 100 MG PO CAPS
ORAL_CAPSULE | ORAL | Status: AC
  Administered 2017-12-12: 100 mg via ORAL
  Filled 2017-12-12: qty 1

## 2017-12-12 NOTE — ED Triage Notes (Signed)
Pt presents today via [facility] from home with wife. Pt went to PCP 3 days ago for congestion. Xray was neg no meds were given. Pt is A/O x4 pt VS WNL. Awaiting EDP

## 2017-12-12 NOTE — Discharge Instructions (Addendum)
You are evaluated for cough and hoarse voice, and are diagnosed with laryngitis and bronchitis.  You are found to have wheezing.  Overall your exam and evaluation were reassuring in the emergency department.  We discussed that your heart rate was somewhat elevated, however it sound like this has been true recently.  You are being started on cough medication called Tessalon, as well as antibiotic called azithromycin for bronchitis and possibility of bacterial upper respiratory infection although your chest x-ray was clear.  You are also being started on albuterol to help with the wheezing.  Return to the emergency department immediately for any worsening trouble breathing, chest pain, fever, confusion or altered mental status, or any other symptoms concerning to you.

## 2017-12-12 NOTE — ED Provider Notes (Signed)
[facility] Emergency Department Provider Note ____________________________________________   I have reviewed the triage vital signs and the triage nursing note.  HISTORY  Chief Complaint Shortness of Breath   Historian Patient, wife and daughter HPI Gary Chang is a 83 y.o. male with history of paroxysmal A. fib/A flutter, seen by his primary care doctor 2 days ago and diagnosed with laryngitis due to hoarse voice and fatigue, for 2-3 days before that, presents today with persistent coughing and hoarseness of his voice but seems little bit worse.  No fevers or chills, but he just feels some fatigue.  He does have a hoarse voice.  No problems swallowing or throat closing.  He has a cough that is worse at night, and when it starts it is hard to stop.  He feels some chest soreness from coughing so much.  This is a dry cough, not bringing anything up.   Past Medical History:  Diagnosis Date  . Abdominal aortic aneurysm (AAA) ([place])   . Allergic state   . Anxiety   . Arthritis   . Cancer ([place])    skin ca removed  . COPD (chronic obstructive pulmonary disease) ([place])   . Depression   . Diabetes mellitus    tingling in feet  . Dyspnea    with exertion  . Enlarged prostate    laser surgery  . GERD (gastroesophageal reflux disease)   . H/O hiatal hernia   . High cholesterol   . Hypertension    pcp  dr Jeneen Rinks [place]  [place]  . Kidney stones   . Melanoma ([place])   . Pneumonia   . Renal artery aneurysm [facility])     Patient Active Problem List   Diagnosis Date Noted  . Abdominal pain 01/30/2017  . Ventral hernia without obstruction or gangrene 01/30/2017  . Tension headache 04/30/2016  . Atypical facial pain 04/30/2016  . Left-sided weakness   . Occlusion and stenosis of basilar artery   . Vertebral artery stenosis   . Basilar artery stenosis 02/28/2016  . Dizziness and giddiness 11/29/2013  . Abnormality of gait 11/29/2013  . Diabetic  neuropathy ([place]) 11/29/2013  . Memory loss 11/29/2013  . Headache, paroxysmal hemicrania, episodic 11/29/2013    Past Surgical History:  Procedure Laterality Date  . ABDOMINAL AORTIC ANEURYSM REPAIR    . BACK SURGERY     lumbar  ,cervical surgeries  . CATARACT EXTRACTION W/PHACO Left 12/11/2016   Procedure: CATARACT EXTRACTION PHACO AND INTRAOCULAR LENS PLACEMENT (IOC);  Surgeon: Leandrew Koyanagi, MD;  Location: [facility] ORS;  Service: Ophthalmology;  Laterality: Left;  [place] 1:17 AP% 17.7 CDE 13.8 FLUID PACK LOT # [id number]  . CATARACT EXTRACTION W/PHACO Right 08/26/2017   Procedure: CATARACT EXTRACTION PHACO AND INTRAOCULAR LENS PLACEMENT ([place]) RIGHT;  Surgeon: Leandrew Koyanagi, MD;  Location: [place];  Service: Ophthalmology;  Laterality: Right;  IVA TOPICAL DIABETES - oral meds  . ESOPHAGOGASTRODUODENOSCOPY (EGD) WITH PROPOFOL N/A 02/11/2016   Procedure: ESOPHAGOGASTRODUODENOSCOPY (EGD) WITH PROPOFOL;  Surgeon: Hulen Luster, MD;  Location[facility];  Service: Gastroenterology;  Laterality: N/A;  . EYE SURGERY     right cataract  . GREEN LIGHT LASER TURP (TRANSURETHRAL RESECTION OF PROSTATE N/A 07/14/2017   Procedure: GREEN LIGHT LASER TURP (TRANSURETHRAL RESECTION OF PROSTATE;  Surgeon: Royston Cowper, MD;  Location: [facility] ORS;  Service: Urology;  Laterality: N/A;  . HERNIA REPAIR     inguinal  . KIDNEY STONE SURGERY    . LUMBAR LAMINECTOMY/DECOMPRESSION MICRODISCECTOMY  12/10/2011   Procedure: LUMBAR LAMINECTOMY/DECOMPRESSION MICRODISCECTOMY;  Surgeon: Ophelia Charter;  Location: [facility] NEURO ORS;  Service: Neurosurgery;;  Thoracic One-Two Laminectomy    Prior to Admission medications   Medication Sig Start Date End Date Taking? Authorizing Provider  apixaban (ELIQUIS) 5 MG TABS tablet Take 1 tablet by mouth 2 (two) times daily.   Yes [provider]  aspirin EC 81 MG tablet Take 81 mg by mouth daily.   Yes [provider]  b complex vitamins tablet  Take 1 tablet by mouth daily.   Yes [provider]  donepezil (ARICEPT) 5 MG tablet Take 5 mg by mouth every morning. 04/16/16  Yes [provider]  glipiZIDE (GLUCOTROL XL) 2.5 MG 24 hr tablet Take 2.5 mg by mouth at bedtime.     Yes [provider]  GLUCOSAMINE-CHONDROITIN PO Take 1 tablet by mouth 2 (two) times daily.    Yes [provider]  hydrALAZINE (APRESOLINE) 100 MG tablet Take 100 mg by mouth 3 (three) times daily.  11/02/13  Yes [provider]  hydrochlorothiazide (HYDRODIURIL) 25 MG tablet Take 25 mg by mouth every morning.    Yes [provider]  losartan (COZAAR) 100 MG tablet Take 100 mg by mouth every evening.    Yes [provider]  Magnesium Oxide 250 MG TABS Take 250 mg by mouth every evening.   Yes [provider]  metoprolol succinate (TOPROL-XL) 25 MG 24 hr tablet Take 25 mg by mouth daily.    Yes [provider]  potassium chloride SA (K-DUR,KLOR-CON) 20 MEQ tablet Take 20 mEq by mouth 2 (two) times daily.   Yes [provider]  sertraline (ZOLOFT) 100 MG tablet Take 100 mg by mouth daily.   Yes [provider]  vitamin B-12 (CYANOCOBALAMIN) 500 MCG tablet Take 500 mcg by mouth daily.   Yes [provider]  acetaminophen (TYLENOL) 500 MG tablet Take 500 mg by mouth every 8 (eight) hours as needed for mild pain.    [provider]  albuterol (PROVENTIL HFA;VENTOLIN HFA) 108 (90 Base) MCG/ACT inhaler Inhale 2 puffs into the lungs every 6 (six) hours as needed for wheezing or shortness of breath. 12/12/17   Lisa Roca, MD  azithromycin (ZITHROMAX) 250 MG tablet 1 tab daily for 4 more days 12/12/17   Lisa Roca, MD  benzonatate (TESSALON PERLES) 100 MG capsule Take 1 capsule (100 mg total) by mouth 3 (three) times daily as needed for cough. 12/12/17   Lisa Roca, MD  ciprofloxacin (CIPRO) 500 MG tablet Take 1 tablet (500 mg total) by mouth 2 (two) times  daily. Patient not taking: Reported on 08/25/2017 07/14/17   Royston Cowper, MD  dicyclomine (BENTYL) 10 MG capsule Take 10 mg by mouth as needed for spasms.    [provider]  docusate sodium (COLACE) 100 MG capsule Take 2 capsules (200 mg total) by mouth 2 (two) times daily. 07/14/17   Royston Cowper, MD  HYDROcodone-acetaminophen (NORCO/VICODIN) 5-325 MG per tablet Take 1 tablet by mouth 2 (two) times daily as needed for moderate pain.  10/25/13   [provider]  Methen-Hyosc-Meth Blue-Na Phos (UROGESIC-BLUE) 81.6 MG TABS Take 1 tablet (81.6 mg total) by mouth every 6 (six) hours as needed (dysuria). Patient not taking: Reported on 08/25/2017 07/14/17   Royston Cowper, MD    Allergies  Allergen Reactions  . Ezetimibe Other (See Comments)    "Numbness and tingling"  . Statins Other (See  Comments)    "Numbness and tingling"    Family History  Problem Relation Age of Onset  . Heart Problems Mother   . Tuberculosis Father   . Anesthesia problems Neg Hx   . Hypotension Neg Hx   . Malignant hyperthermia Neg Hx   . Pseudochol deficiency Neg Hx     Social History Social History   Tobacco Use  . Smoking status: Former Smoker    Types: Pipe    Last attempt to quit: 07/10/1987    Years since quitting: 30.4  . Smokeless tobacco: Never Used  Substance Use Topics  . Alcohol use: Yes    Comment: occ 1-2 beers a month  . Drug use: No    Review of Systems  Constitutional: Negative for fever. Eyes: Negative for visual changes. ENT: Negative for sore throat, but he does have a hoarse voice right now. Cardiovascular: Negative for pleuritic chest pain, he does have some soreness to his ribs coughing Respiratory: The for dry cough and shortness of breath with coughing, but not really at rest. Gastrointestinal: Negative for abdominal pain, vomiting and diarrhea. Genitourinary: Negative for dysuria. Musculoskeletal: Negative for back pain. Skin: Negative for  rash. Neurological: Negative for headache.  ____________________________________________   PHYSICAL EXAM:  VITAL SIGNS: ED Triage Vitals  Enc Vitals Group     BP --      Pulse Rate 12/12/17 1410 95     Resp 12/12/17 1410 20     Temp 12/12/17 1410 98.2 F (36.8 C)     Temp Source 12/12/17 1410 Oral     SpO2 12/12/17 1410 94 %     Weight 12/12/17 1411 218 lb 3.2 oz (99 kg)     Height 12/12/17 1411 5\' 9"  (1.753 m)     Head Circumference --      Peak Flow --      Pain Score --      Pain Loc --      Pain Edu? --      Excl. in [place]? --      Constitutional: Alert and oriented. Well appearing and in no distress. HEENT   Head: Normocephalic and atraumatic.      Eyes: Conjunctivae are normal. Pupils equal and round.       Ears:         Nose: No congestion/rhinnorhea.   Mouth/Throat: Mucous membranes are moist.  Normal oropharynx, hoarse voice.   Neck: No stridor. Cardiovascular/Chest: Irregularly irregular just at 100 bpm.  No murmurs, rubs, or gallops. Respiratory: Tight breath sounds with end expiratory wheezing.  No rhonchi or rales.. Gastrointestinal: Soft. No distention, no guarding, no rebound. Nontender.    Genitourinary/rectal:Deferred Musculoskeletal: Nontender with normal range of motion in all extremities. No joint effusions.  No lower extremity tenderness.  No edema. Neurologic:  Normal speech and language. No gross or focal neurologic deficits are appreciated. Skin:  Skin is warm, dry and intact. No rash noted. Psychiatric: Mood and affect are normal. Speech and behavior are normal. Patient exhibits appropriate insight and judgment.   ____________________________________________  LABS (pertinent positives/negatives) I, Lisa Roca, MD the attending physician have reviewed the labs noted below.  Labs Reviewed  TROPONIN I - Abnormal; Notable for the following components:      Result Value   Troponin I 0.04 (*)    All other components within normal  limits  BASIC METABOLIC PANEL - Abnormal; Notable for the following components:   Sodium 130 (*)    Potassium 3.4 (*)  Chloride 96 (*)    Glucose, Bld 168 (*)    Creatinine, Ser 1.27 (*)    Calcium 8.5 (*)    GFR calc non Af Amer 49 (*)    GFR calc Af Amer 57 (*)    All other components within normal limits  CBC WITH DIFFERENTIAL/PLATELET - Abnormal; Notable for the following components:   WBC 13.7 (*)    Hemoglobin 11.9 (*)    HCT 37.3 (*)    MCH 25.7 (*)    MCHC 31.9 (*)    RDW 15.1 (*)    Neutro Abs 11.2 (*)    Lymphs Abs 0.6 (*)    Monocytes Absolute 1.9 (*)    All other components within normal limits  BRAIN NATRIURETIC PEPTIDE - Abnormal; Notable for the following components:   B Natriuretic Peptide 329.0 (*)    All other components within normal limits  TROPONIN I - Abnormal; Notable for the following components:   Troponin I 0.03 (*)    All other components within normal limits  CULTURE, BLOOD (ROUTINE X 2)  CULTURE, BLOOD (ROUTINE X 2)    ____________________________________________    EKG I, Lisa Roca, MD, the attending physician have personally viewed and interpreted all ECGs.  112 bpm, undetermined rhythm but appears to likely be atrial flutter.  Narrow transfer normal axis.  Nonspecific ST and T wave.  Occasional PVC ____________________________________________  RADIOLOGY All Xrays were viewed by me.  Imaging interpreted by Radiologist, and I, Lisa Roca, MD the attending physician have reviewed the radiologist interpretation noted below.  Chest x-ray 2 view:  IMPRESSION: 1. Stable chest x-ray without evidence of acute cardiopulmonary process. 2.  Aortic Atherosclerosis (ICD10-170.0) __________________________________________  PROCEDURES  Procedure(s) performed: None  Critical Care performed: None   ____________________________________________  ED COURSE / ASSESSMENT AND PLAN  Pertinent labs & imaging results that were available  during my care of the patient were reviewed by me and considered in my medical decision making (see chart for details).    Patient with a hoarse voice consistent with laryngitis diagnosed a couple of days ago.  He is not having trouble swallowing I am not concerned about pharyngeal abscess or deep space neck infection.  He is extremely wheezy here and had some chest discomfort which seems more so related to coughing.  Initial troponin was 0.04 and repeat was not increasing.  He has had a slightly elevated heart rate here, suspect either related to the albuterol, and family also states that he has a chronically elevated heart rate at least recently, so they are not necessarily extra concerned about that.  He has had no hypotension or fever here.  However given the fact that he feels flulike, I discussed checking a flu swab and they do not feel that he has flu, so we did hold off on that.  Given the persistent cough, I am going to cover him for atypicals with azithromycin and although chest x-ray was clear.  I have asked nurse to draw blood culture prior to discharge.  I had offered patient hospital overnight admission given the and does not feel much better, although he is not specifically hypoxic or hypotensive or have any hard measures for hospitalization, the patient himself is very adamant that he would like to go home.  I do think he is quite reliable to come back if he worsens in any way.  DIFFERENTIAL DIAGNOSIS: Including but not limited to pneumonia, bronchitis, viral upper respiratory infection, laryngitis, allergic reaction, throat tumor,  congestive heart failure, COPD or asthma, ACS, etc.  CONSULTATIONS:   None   Patient / Family / Caregiver informed of clinical course, medical decision-making process, and agree with plan.   I discussed return precautions, follow-up instructions, and discharge instructions with patient and/or family.  Discharge Instructions : You are evaluated for  cough and hoarse voice, and are diagnosed with laryngitis and bronchitis.  You are found to have wheezing.  Overall your exam and evaluation were reassuring in the emergency department.  We discussed that your heart rate was somewhat elevated, however it sound like this has been true recently.  You are being started on cough medication called Tessalon, as well as antibiotic called azithromycin for bronchitis and possibility of bacterial upper respiratory infection although your chest x-ray was clear.  You are also being started on albuterol to help with the wheezing.  Return to the emergency department immediately for any worsening trouble breathing, chest pain, fever, confusion or altered mental status, or any other symptoms concerning to you.    ___________________________________________   FINAL CLINICAL IMPRESSION(S) / ED DIAGNOSES   Final diagnoses:  Bronchitis  Laryngitis      ___________________________________________        Note: This dictation was prepared with Dragon dictation. Any transcriptional errors that result from this process are unintentional    Lisa Roca, MD 12/12/17 1949

## 2017-12-12 NOTE — ED Notes (Signed)
Date and time results received: 12/12/17 1550  Test: Troponin Critical Value: 0.04  Name of Provider Notified: Reita Cliche

## 2017-12-12 NOTE — ED Notes (Signed)
This RN called Lab to verify the BNP was working as the order was placed at 1528. Gwyn in lab stated it had not been ran yet. Lab will run BNP now. RN will continue to monitor.

## 2017-12-17 LAB — CULTURE, BLOOD (ROUTINE X 2)
Culture: NO GROWTH
Culture: NO GROWTH
Special Requests: ADEQUATE

## 2017-12-23 DIAGNOSIS — I4892 Unspecified atrial flutter: Secondary | ICD-10-CM | POA: Diagnosis not present

## 2017-12-23 DIAGNOSIS — I10 Essential (primary) hypertension: Secondary | ICD-10-CM | POA: Diagnosis not present

## 2017-12-23 DIAGNOSIS — E782 Mixed hyperlipidemia: Secondary | ICD-10-CM | POA: Diagnosis not present

## 2017-12-23 DIAGNOSIS — R0602 Shortness of breath: Secondary | ICD-10-CM | POA: Diagnosis not present

## 2017-12-29 ENCOUNTER — Ambulatory Visit: Admit: 2017-12-29 | Payer: PPO | Admitting: Internal Medicine

## 2017-12-29 DIAGNOSIS — H6123 Impacted cerumen, bilateral: Secondary | ICD-10-CM | POA: Diagnosis not present

## 2017-12-29 DIAGNOSIS — H903 Sensorineural hearing loss, bilateral: Secondary | ICD-10-CM | POA: Diagnosis not present

## 2017-12-29 DIAGNOSIS — R05 Cough: Secondary | ICD-10-CM | POA: Diagnosis not present

## 2017-12-29 SURGERY — CARDIOVERSION (CATH LAB)
Anesthesia: General

## 2018-01-28 DIAGNOSIS — Z85828 Personal history of other malignant neoplasm of skin: Secondary | ICD-10-CM | POA: Diagnosis not present

## 2018-01-28 DIAGNOSIS — L820 Inflamed seborrheic keratosis: Secondary | ICD-10-CM | POA: Diagnosis not present

## 2018-01-28 DIAGNOSIS — L578 Other skin changes due to chronic exposure to nonionizing radiation: Secondary | ICD-10-CM | POA: Diagnosis not present

## 2018-01-28 DIAGNOSIS — L570 Actinic keratosis: Secondary | ICD-10-CM | POA: Diagnosis not present

## 2018-02-04 DIAGNOSIS — R51 Headache: Secondary | ICD-10-CM | POA: Diagnosis not present

## 2018-02-04 DIAGNOSIS — R05 Cough: Secondary | ICD-10-CM | POA: Diagnosis not present

## 2018-02-04 DIAGNOSIS — I10 Essential (primary) hypertension: Secondary | ICD-10-CM | POA: Diagnosis not present

## 2018-02-04 DIAGNOSIS — R000 Tachycardia, unspecified: Secondary | ICD-10-CM | POA: Diagnosis not present

## 2018-02-10 ENCOUNTER — Inpatient Hospital Stay
Admission: EM | Admit: 2018-02-10 | Discharge: 2018-02-12 | DRG: 291 | Disposition: A | Discharge status: Home Health | Payer: PPO | Attending: Internal Medicine | Admitting: Internal Medicine

## 2018-02-10 ENCOUNTER — Other Ambulatory Visit: Payer: Self-pay

## 2018-02-10 ENCOUNTER — Emergency Department: Payer: PPO

## 2018-02-10 ENCOUNTER — Inpatient Hospital Stay
Admit: 2018-02-10 | Discharge: 2018-02-10 | Disposition: A | Discharge status: Home / Self Care | Payer: PPO | Attending: Internal Medicine | Admitting: Internal Medicine

## 2018-02-10 DIAGNOSIS — R0602 Shortness of breath: Secondary | ICD-10-CM | POA: Diagnosis not present

## 2018-02-10 DIAGNOSIS — N400 Enlarged prostate without lower urinary tract symptoms: Secondary | ICD-10-CM | POA: Diagnosis present

## 2018-02-10 DIAGNOSIS — Z961 Presence of intraocular lens: Secondary | ICD-10-CM | POA: Diagnosis present

## 2018-02-10 DIAGNOSIS — I509 Heart failure, unspecified: Secondary | ICD-10-CM | POA: Diagnosis not present

## 2018-02-10 DIAGNOSIS — E119 Type 2 diabetes mellitus without complications: Secondary | ICD-10-CM | POA: Diagnosis present

## 2018-02-10 DIAGNOSIS — Z79899 Other long term (current) drug therapy: Secondary | ICD-10-CM | POA: Diagnosis not present

## 2018-02-10 DIAGNOSIS — Z85828 Personal history of other malignant neoplasm of skin: Secondary | ICD-10-CM | POA: Diagnosis not present

## 2018-02-10 DIAGNOSIS — F419 Anxiety disorder, unspecified: Secondary | ICD-10-CM | POA: Diagnosis present

## 2018-02-10 DIAGNOSIS — E7800 Pure hypercholesterolemia, unspecified: Secondary | ICD-10-CM | POA: Diagnosis not present

## 2018-02-10 DIAGNOSIS — E876 Hypokalemia: Secondary | ICD-10-CM | POA: Diagnosis not present

## 2018-02-10 DIAGNOSIS — Z85820 Personal history of malignant melanoma of skin: Secondary | ICD-10-CM | POA: Diagnosis not present

## 2018-02-10 DIAGNOSIS — E871 Hypo-osmolality and hyponatremia: Secondary | ICD-10-CM | POA: Diagnosis not present

## 2018-02-10 DIAGNOSIS — I5031 Acute diastolic (congestive) heart failure: Secondary | ICD-10-CM | POA: Diagnosis not present

## 2018-02-10 DIAGNOSIS — I482 Chronic atrial fibrillation: Secondary | ICD-10-CM | POA: Diagnosis present

## 2018-02-10 DIAGNOSIS — Z9842 Cataract extraction status, left eye: Secondary | ICD-10-CM

## 2018-02-10 DIAGNOSIS — Z7901 Long term (current) use of anticoagulants: Secondary | ICD-10-CM | POA: Diagnosis not present

## 2018-02-10 DIAGNOSIS — F329 Major depressive disorder, single episode, unspecified: Secondary | ICD-10-CM | POA: Diagnosis present

## 2018-02-10 DIAGNOSIS — I5020 Unspecified systolic (congestive) heart failure: Secondary | ICD-10-CM | POA: Diagnosis not present

## 2018-02-10 DIAGNOSIS — I110 Hypertensive heart disease with heart failure: Secondary | ICD-10-CM | POA: Diagnosis not present

## 2018-02-10 DIAGNOSIS — I4891 Unspecified atrial fibrillation: Secondary | ICD-10-CM

## 2018-02-10 DIAGNOSIS — Z7984 Long term (current) use of oral hypoglycemic drugs: Secondary | ICD-10-CM

## 2018-02-10 DIAGNOSIS — Z87891 Personal history of nicotine dependence: Secondary | ICD-10-CM | POA: Diagnosis not present

## 2018-02-10 DIAGNOSIS — K219 Gastro-esophageal reflux disease without esophagitis: Secondary | ICD-10-CM | POA: Diagnosis not present

## 2018-02-10 DIAGNOSIS — Z66 Do not resuscitate: Secondary | ICD-10-CM | POA: Diagnosis present

## 2018-02-10 DIAGNOSIS — Z87442 Personal history of urinary calculi: Secondary | ICD-10-CM | POA: Diagnosis not present

## 2018-02-10 DIAGNOSIS — Z9841 Cataract extraction status, right eye: Secondary | ICD-10-CM | POA: Diagnosis not present

## 2018-02-10 DIAGNOSIS — Z7982 Long term (current) use of aspirin: Secondary | ICD-10-CM

## 2018-02-10 DIAGNOSIS — R0600 Dyspnea, unspecified: Secondary | ICD-10-CM | POA: Diagnosis not present

## 2018-02-10 DIAGNOSIS — R05 Cough: Secondary | ICD-10-CM | POA: Diagnosis not present

## 2018-02-10 DIAGNOSIS — J449 Chronic obstructive pulmonary disease, unspecified: Secondary | ICD-10-CM | POA: Diagnosis present

## 2018-02-10 LAB — BASIC METABOLIC PANEL
Anion gap: 10 (ref 5–15)
BUN: 18 mg/dL (ref 6–20)
CHLORIDE: 97 mmol/L — AB (ref 101–111)
CO2: 24 mmol/L (ref 22–32)
Calcium: 8.8 mg/dL — ABNORMAL LOW (ref 8.9–10.3)
Creatinine, Ser: 1.15 mg/dL (ref 0.61–1.24)
GFR calc non Af Amer: 55 mL/min — ABNORMAL LOW (ref >60)
Glucose, Bld: 147 mg/dL — ABNORMAL HIGH (ref 65–99)
POTASSIUM: 3.3 mmol/L — AB (ref 3.5–5.1)
SODIUM: 131 mmol/L — AB (ref 135–145)

## 2018-02-10 LAB — CBC
HEMATOCRIT: 35.1 % — AB (ref 40.0–52.0)
HEMOGLOBIN: 11 g/dL — AB (ref 13.0–18.0)
MCH: 23.2 pg — ABNORMAL LOW (ref 26.0–34.0)
MCHC: 31.3 g/dL — ABNORMAL LOW (ref 32.0–36.0)
MCV: 74.1 fL — AB (ref 80.0–100.0)
Platelets: 198 10*3/uL (ref 150–440)
RBC: 4.74 MIL/uL (ref 4.40–5.90)
RDW: 16.9 % — ABNORMAL HIGH (ref 11.5–14.5)
WBC: 10.6 10*3/uL (ref 3.8–10.6)

## 2018-02-10 LAB — GLUCOSE, CAPILLARY: Glucose-Capillary: 132 mg/dL — ABNORMAL HIGH (ref 65–99)

## 2018-02-10 LAB — TROPONIN I: Troponin I: <0.03 ng/mL (ref <0.03)

## 2018-02-10 LAB — BRAIN NATRIURETIC PEPTIDE: B Natriuretic Peptide: 284 pg/mL — ABNORMAL HIGH (ref 0.0–100.0)

## 2018-02-10 MED ORDER — HYDRALAZINE HCL 50 MG PO TABS
100.0000 mg | ORAL_TABLET | Freq: Three times a day (TID) | ORAL | Status: DC
  Administered 2018-02-10 – 2018-02-12 (×5): 100 mg via ORAL
  Filled 2018-02-10 (×5): qty 2

## 2018-02-10 MED ORDER — SERTRALINE HCL 50 MG PO TABS
100.0000 mg | ORAL_TABLET | Freq: Every day | ORAL | Status: DC
  Administered 2018-02-10 – 2018-02-12 (×3): 100 mg via ORAL
  Filled 2018-02-10 (×3): qty 2

## 2018-02-10 MED ORDER — SODIUM CHLORIDE 0.9% FLUSH
3.0000 mL | INTRAVENOUS | Status: DC | PRN
  Administered 2018-02-11: 3 mL via INTRAVENOUS
  Administered 2018-02-12: 6 mL via INTRAVENOUS
  Filled 2018-02-10 (×2): qty 3

## 2018-02-10 MED ORDER — ASPIRIN 81 MG PO CHEW
324.0000 mg | CHEWABLE_TABLET | Freq: Once | ORAL | Status: AC
  Administered 2018-02-10: 324 mg via ORAL
  Filled 2018-02-10: qty 4

## 2018-02-10 MED ORDER — FUROSEMIDE 10 MG/ML IJ SOLN
40.0000 mg | Freq: Once | INTRAMUSCULAR | Status: AC
  Administered 2018-02-10: 40 mg via INTRAVENOUS
  Filled 2018-02-10: qty 4

## 2018-02-10 MED ORDER — SENNOSIDES-DOCUSATE SODIUM 8.6-50 MG PO TABS: 1.0000 | ORAL_TABLET | Freq: Every evening | ORAL | Status: DC | PRN

## 2018-02-10 MED ORDER — METOPROLOL SUCCINATE ER 50 MG PO TB24
50.0000 mg | ORAL_TABLET | Freq: Every day | ORAL | Status: DC
  Administered 2018-02-11 – 2018-02-12 (×2): 50 mg via ORAL
  Filled 2018-02-10 (×2): qty 1

## 2018-02-10 MED ORDER — HYDRALAZINE HCL 20 MG/ML IJ SOLN
10.0000 mg | Freq: Four times a day (QID) | INTRAMUSCULAR | Status: DC | PRN
  Administered 2018-02-10: 10 mg via INTRAVENOUS
  Filled 2018-02-10: qty 1

## 2018-02-10 MED ORDER — DONEPEZIL HCL 5 MG PO TABS
5.0000 mg | ORAL_TABLET | ORAL | Status: DC
  Administered 2018-02-11 – 2018-02-12 (×2): 5 mg via ORAL
  Filled 2018-02-10 (×2): qty 1

## 2018-02-10 MED ORDER — LOSARTAN POTASSIUM 50 MG PO TABS
100.0000 mg | ORAL_TABLET | Freq: Every evening | ORAL | Status: DC
  Administered 2018-02-10 – 2018-02-11 (×2): 100 mg via ORAL
  Filled 2018-02-10 (×2): qty 2

## 2018-02-10 MED ORDER — HYDROCODONE-ACETAMINOPHEN 5-325 MG PO TABS: 1.0000 | ORAL_TABLET | ORAL | Status: DC | PRN

## 2018-02-10 MED ORDER — POTASSIUM CHLORIDE CRYS ER 20 MEQ PO TBCR
20.0000 meq | EXTENDED_RELEASE_TABLET | Freq: Two times a day (BID) | ORAL | Status: DC
  Administered 2018-02-10 – 2018-02-12 (×4): 20 meq via ORAL
  Filled 2018-02-10 (×5): qty 1

## 2018-02-10 MED ORDER — ONDANSETRON HCL 4 MG PO TABS: 4.0000 mg | ORAL_TABLET | Freq: Four times a day (QID) | ORAL | Status: DC | PRN

## 2018-02-10 MED ORDER — INSULIN ASPART 100 UNIT/ML ~~LOC~~ SOLN: 0.0000 [IU] | Freq: Every day | SUBCUTANEOUS | Status: DC

## 2018-02-10 MED ORDER — DOCUSATE SODIUM 100 MG PO CAPS
200.0000 mg | ORAL_CAPSULE | Freq: Two times a day (BID) | ORAL | Status: DC
  Administered 2018-02-10 – 2018-02-11 (×2): 200 mg via ORAL
  Filled 2018-02-10 (×4): qty 2

## 2018-02-10 MED ORDER — INSULIN ASPART 100 UNIT/ML ~~LOC~~ SOLN
0.0000 [IU] | Freq: Three times a day (TID) | SUBCUTANEOUS | Status: DC
  Administered 2018-02-11: 1 [IU] via SUBCUTANEOUS
  Administered 2018-02-11: 2 [IU] via SUBCUTANEOUS
  Administered 2018-02-12: 1 [IU] via SUBCUTANEOUS
  Filled 2018-02-10 (×3): qty 1

## 2018-02-10 MED ORDER — BISACODYL 5 MG PO TBEC: 5.0000 mg | DELAYED_RELEASE_TABLET | Freq: Every day | ORAL | Status: DC | PRN

## 2018-02-10 MED ORDER — MAGNESIUM OXIDE 250 MG PO TABS: 250.0000 mg | ORAL_TABLET | Freq: Every evening | ORAL | Status: DC

## 2018-02-10 MED ORDER — ASPIRIN EC 81 MG PO TBEC
81.0000 mg | DELAYED_RELEASE_TABLET | Freq: Every day | ORAL | Status: DC
  Administered 2018-02-11 – 2018-02-12 (×2): 81 mg via ORAL
  Filled 2018-02-10 (×2): qty 1

## 2018-02-10 MED ORDER — ACETAMINOPHEN 650 MG RE SUPP: 650.0000 mg | Freq: Four times a day (QID) | RECTAL | Status: DC | PRN

## 2018-02-10 MED ORDER — DICYCLOMINE HCL 10 MG PO CAPS: 10.0000 mg | ORAL_CAPSULE | Freq: Four times a day (QID) | ORAL | Status: DC | PRN

## 2018-02-10 MED ORDER — APIXABAN 5 MG PO TABS
5.0000 mg | ORAL_TABLET | Freq: Two times a day (BID) | ORAL | Status: DC
  Administered 2018-02-10 – 2018-02-12 (×4): 5 mg via ORAL
  Filled 2018-02-10 (×4): qty 1

## 2018-02-10 MED ORDER — URO-MP 118 MG PO CAPS: 1.0000 | ORAL_CAPSULE | Freq: Two times a day (BID) | ORAL | Status: DC

## 2018-02-10 MED ORDER — SODIUM CHLORIDE 0.9 % IV SOLN
250.0000 mL | INTRAVENOUS | Status: DC | PRN
Start: 2018-02-10 — End: 2018-02-12

## 2018-02-10 MED ORDER — ALBUTEROL SULFATE (2.5 MG/3ML) 0.083% IN NEBU: 2.5000 mg | INHALATION_SOLUTION | RESPIRATORY_TRACT | Status: DC | PRN

## 2018-02-10 MED ORDER — SODIUM CHLORIDE 0.9% FLUSH
3.0000 mL | Freq: Two times a day (BID) | INTRAVENOUS | Status: DC
  Administered 2018-02-10 – 2018-02-12 (×4): 3 mL via INTRAVENOUS

## 2018-02-10 MED ORDER — FUROSEMIDE 10 MG/ML IJ SOLN
40.0000 mg | Freq: Two times a day (BID) | INTRAMUSCULAR | Status: DC
  Administered 2018-02-11: 40 mg via INTRAVENOUS
  Filled 2018-02-10: qty 4

## 2018-02-10 MED ORDER — ONDANSETRON HCL 4 MG/2ML IJ SOLN: 4.0000 mg | Freq: Four times a day (QID) | INTRAMUSCULAR | Status: DC | PRN

## 2018-02-10 MED ORDER — ACETAMINOPHEN 325 MG PO TABS: 650.0000 mg | ORAL_TABLET | Freq: Four times a day (QID) | ORAL | Status: DC | PRN

## 2018-02-10 NOTE — ED Triage Notes (Signed)
Pt states was moving around during the night putting a sheet on the bed and he became SOB. States SOB with movement. States since sitting in wheelchair he doesn't feel so SOB. States gained 10lb in last week. Denise CHF. Denies COPD. Alert, oriented. States chest discomfort.

## 2018-02-10 NOTE — H&P (Addendum)
[facility] NAME: Gary Chang    MR#:  [id number]  DATE OF BIRTH:  [birth year]  DATE OF ADMISSION:  02/10/2018  PRIMARY CARE PHYSICIAN: [name], MD   REQUESTING/REFERRING PHYSICIAN: Schuyler Amor, MD  CHIEF COMPLAINT:   Chief Complaint  Patient presents with  . Shortness of Breath   Shortness of breath since this morning.SOB this am. HISTORY OF PRESENT ILLNESS:  Gary Chang  is a 84 y.o. male with a known history of multiple medical problems as below. He came to ED due to SOB this am. He has had cough for 2 months and gained weight 10 lb. CXR: Probable fluid overload/congestive heart failure. Pulmonary venous hypertension.   PAST MEDICAL HISTORY:   Past Medical History:  Diagnosis Date  . Abdominal aortic aneurysm (AAA) ([place])   . Allergic state   . Anxiety   . Arthritis   . Cancer ([place])    skin ca removed  . COPD (chronic obstructive pulmonary disease) ([place])   . Depression   . Diabetes mellitus    tingling in feet  . Dyspnea    with exertion  . Enlarged prostate    laser surgery  . GERD (gastroesophageal reflux disease)   . H/O hiatal hernia   . High cholesterol   . Hypertension    pcp  dr Jeneen Rinks [place]  [place]  . Kidney stones   . Melanoma ([place])   . Pneumonia   . 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 ABDOMINAL AORTIC ANEURYSM REPAIR    . BACK SURGERY     lumbar  ,cervical surgeries  . CATARACT EXTRACTION W/PHACO Left 12/11/2016   Procedure: CATARACT EXTRACTION PHACO AND INTRAOCULAR LENS PLACEMENT (IOC);  Surgeon: Leandrew Koyanagi, MD;  Location: [facility] ORS;  Service: Ophthalmology;  Laterality: Left;  [place] 1:17 AP% 17.7 CDE 13.8 FLUID PACK LOT # [id number]  . CATARACT EXTRACTION W/PHACO Right 08/26/2017   Procedure: CATARACT EXTRACTION PHACO AND INTRAOCULAR LENS PLACEMENT ([place]) RIGHT;  Surgeon: Leandrew Koyanagi, MD;  Location: [facility];  Service: Ophthalmology;  Laterality: Right;  IVA TOPICAL DIABETES - oral meds  . ESOPHAGOGASTRODUODENOSCOPY (EGD) WITH PROPOFOL N/A 02/11/2016   Procedure: ESOPHAGOGASTRODUODENOSCOPY (EGD) WITH PROPOFOL;  Surgeon: Hulen Luster, MD;  Location: [facility];  Service: Gastroenterology;  Laterality: N/A;  . EYE SURGERY     right cataract  . GREEN LIGHT LASER TURP (TRANSURETHRAL RESECTION OF PROSTATE N/A 07/14/2017   Procedure: GREEN LIGHT LASER TURP (TRANSURETHRAL RESECTION OF PROSTATE;  Surgeon: Royston Cowper, MD;  Location: [facility] ORS;  Service: Urology;  Laterality: N/A;  . HERNIA REPAIR     inguinal  . KIDNEY STONE SURGERY    . LUMBAR LAMINECTOMY/DECOMPRESSION MICRODISCECTOMY  12/10/2011   Procedure: LUMBAR LAMINECTOMY/DECOMPRESSION MICRODISCECTOMY;  Surgeon: Ophelia Charter;  Location: [facility];  Service: Neurosurgery;;  Thoracic One-Two Laminectomy    SOCIAL HISTORY:   Social History   Tobacco Use  . Smoking status: Former Smoker    Types: Pipe    Last attempt to quit: 07/10/1987    Years since quitting: 30.6  . Smokeless tobacco: Never Used  Substance Use Topics  . Alcohol use: Yes    Comment: occ 1-2 beers a month    FAMILY HISTORY:   Family History  Problem Relation Age of Onset  . Heart Problems Mother   . Tuberculosis Father   .  Anesthesia problems Neg Hx   . Hypotension Neg Hx   . Malignant hyperthermia Neg Hx   . Pseudochol deficiency Neg Hx     DRUG ALLERGIES:   Allergies  Allergen Reactions  . Ezetimibe Other (See Comments)    "Numbness and tingling"  . Statins Other (See Comments)    "Numbness and tingling"    REVIEW OF SYSTEMS:   Review of Systems  Constitutional: Positive for malaise/fatigue. Negative for chills and fever.       Weight gain  HENT: Positive for hearing loss. Negative for sore throat.   Eyes: Negative for blurred vision and double vision.  Respiratory: Positive for cough, sputum  production and shortness of breath. Negative for hemoptysis, wheezing and stridor.   Cardiovascular: Negative for chest pain, palpitations, orthopnea and leg swelling.  Gastrointestinal: Negative for abdominal pain, blood in stool, diarrhea, melena, nausea and vomiting.  Genitourinary: Negative for dysuria, flank pain and hematuria.  Musculoskeletal: Negative for back pain and joint pain.  Skin: Negative for rash.  Neurological: Negative for dizziness, sensory change, focal weakness, seizures, loss of consciousness, weakness and headaches.  Endo/Heme/Allergies: Negative for polydipsia.  Psychiatric/Behavioral: Negative for depression. The patient is not nervous/anxious.     MEDICATIONS AT HOME:   Prior to Admission medications   Medication Sig Start Date End Date Taking? Authorizing Provider  acetaminophen (TYLENOL) 500 MG tablet Take 500 mg by mouth every 8 (eight) hours as needed for mild pain.   Yes [provider]  apixaban (ELIQUIS) 5 MG TABS tablet Take 1 tablet by mouth 2 (two) times daily.   Yes [provider]  aspirin EC 81 MG tablet Take 81 mg by mouth daily.   Yes [provider]  b complex vitamins tablet Take 1 tablet by mouth daily.   Yes [provider]  cholecalciferol (VITAMIN D) 1000 units tablet Take 1,000 Units by mouth daily.    Yes [provider]  dicyclomine (BENTYL) 10 MG capsule Take 10 mg by mouth 4 (four) times daily as needed for spasms.    Yes [provider]  docusate sodium (COLACE) 100 MG capsule Take 2 capsules (200 mg total) by mouth 2 (two) times daily. 07/14/17  Yes Royston Cowper, MD  donepezil (ARICEPT) 5 MG tablet Take 5 mg by mouth every morning. 04/16/16  Yes [provider]  glipiZIDE (GLUCOTROL XL) 2.5 MG 24 hr tablet Take 2.5 mg by mouth at bedtime.     Yes [provider]  GLUCOSAMINE-CHONDROITIN PO Take 1 tablet by mouth 2 (two) times daily.    Yes [provider]    hydrALAZINE (APRESOLINE) 100 MG tablet Take 100 mg by mouth 3 (three) times daily.  11/02/13  Yes [provider]  hydrochlorothiazide (HYDRODIURIL) 25 MG tablet Take 25 mg by mouth every morning.    Yes [provider]  HYDROcodone-acetaminophen (NORCO/VICODIN) 5-325 MG per tablet Take 1 tablet by mouth 2 (two) times daily as needed for moderate pain.  10/25/13  Yes [provider]  losartan (COZAAR) 100 MG tablet Take 100 mg by mouth every evening.    Yes [provider]  Magnesium Oxide 250 MG TABS Take 250 mg by mouth every evening.   Yes [provider]  Meth-Hyo-M Bl-Na Phos-Ph Sal (URO-MP) 118 MG CAPS Take 1 capsule by mouth 2 (two) times daily.   Yes [provider]  metoprolol succinate (TOPROL-XL) 50 MG 24 hr tablet Take 50 mg by mouth daily.  Yes [provider]  potassium chloride SA (K-DUR,KLOR-CON) 20 MEQ tablet Take 20 mEq by mouth 2 (two) times daily.   Yes [provider]  sertraline (ZOLOFT) 100 MG tablet Take 100 mg by mouth daily.   Yes [provider]  albuterol (PROVENTIL HFA;VENTOLIN HFA) 108 (90 Base) MCG/ACT inhaler Inhale 2 puffs into the lungs every 6 (six) hours as needed for wheezing or shortness of breath. Patient not taking: Reported on 02/10/2018 12/12/17   Lisa Roca, MD  azithromycin [facility]) 250 MG tablet 1 tab daily for 4 more days Patient not taking: Reported on 02/10/2018 12/12/17   Lisa Roca, MD  benzonatate (TESSALON PERLES) 100 MG capsule Take 1 capsule (100 mg total) by mouth 3 (three) times daily as needed for cough. Patient not taking: Reported on 02/10/2018 12/12/17   Lisa Roca, MD  ciprofloxacin (CIPRO) 500 MG tablet Take 1 tablet (500 mg total) by mouth 2 (two) times daily. Patient not taking: Reported on 08/25/2017 07/14/17   Royston Cowper, MD  Methen-Hyosc-Meth Blue-Na Phos (UROGESIC-BLUE) 81.6 MG TABS Take 1 tablet (81.6 mg total) by mouth every 6 (six)  hours as needed (dysuria). Patient not taking: Reported on 08/25/2017 07/14/17   Royston Cowper, MD      VITAL SIGNS:  Blood pressure (!) 145/77, pulse (!) 101, temperature 98.3 F (36.8 C), temperature source Oral, resp. rate 16, height 5\' 8"  (1.727 m), weight 220 lb (99.8 kg), SpO2 97 %.  PHYSICAL EXAMINATION:  Physical Exam  GENERAL:  84 y.o.-year-old patient lying in the bed with no acute distress.  EYES: Pupils equal, round, reactive to light and accommodation. No scleral icterus. Extraocular muscles intact.  HEENT: Head atraumatic, normocephalic. Oropharynx and nasopharynx clear.  NECK:  Supple, no jugular venous distention. No thyroid enlargement, no tenderness.  LUNGS: Normal breath sounds bilaterally, no wheezing, basilar rales. No use of accessory muscles of respiration.  CARDIOVASCULAR: S1, S2 normal. No murmurs, rubs, or gallops.  ABDOMEN: Soft, nontender, nondistended. Bowel sounds present. No organomegaly or mass.  EXTREMITIES: No pedal edema, cyanosis, or clubbing.  NEUROLOGIC: Cranial nerves II through XII are intact. Muscle strength 5/5 in all extremities. Sensation intact. Gait not checked.  PSYCHIATRIC: The patient is alert and oriented x 3.  SKIN: No obvious rash, lesion, or ulcer.   LABORATORY PANEL:   CBC Recent Labs  Lab 02/10/18 1306  WBC 10.6  HGB 11.0*  HCT 35.1*  PLT 198   ------------------------------------------------------------------------------------------------------------------  Chemistries  Recent Labs  Lab 02/10/18 1306  NA 131*  K 3.3*  CL 97*  CO2 24  GLUCOSE 147*  BUN 18  CREATININE 1.15  CALCIUM 8.8*   ------------------------------------------------------------------------------------------------------------------  Cardiac Enzymes Recent Labs  Lab 02/10/18 1306  TROPONINI <0.03   ------------------------------------------------------------------------------------------------------------------  RADIOLOGY:  Dg Chest 2  View  Result Date: 02/10/2018 CLINICAL DATA:  Shortness of breath beginning last night. Chest discomfort. Cough. EXAM: CHEST  2 VIEW COMPARISON:  12/12/2017 FINDINGS: Mild cardiomegaly. Aortic atherosclerosis. Bilateral pleural effusions, larger on the right than the left, with volume loss at the lung bases. Venous hypertension without frank edema. Upper lungs are clear. IMPRESSION: Probable fluid overload/congestive heart failure. Pulmonary venous hypertension. Cardiomegaly and aortic atherosclerosis. Small effusions with dependent atelectasis, right more than left. Electronically Signed   By: Nelson Chimes M.D.   On: 02/10/2018 13:49      IMPRESSION AND PLAN:   Acute new onset diastolic CHF. The patient will be admitted to medical floor with telemetry monitor. Start  lasix 40 mg bid, echo and cardiologist consult.  Hyponatremia. F/u Na while on lasix. Hypokalemia. Continue KCl.  Chronic Afib. Continue Eliquis and lopressor. HTN, accelerated. Continue home HTN meds. IV hydralazine prn.  COPD. Stable, NEB prn.  DM. Hold glipizide, start sliding scale.  All the records are reviewed and case discussed with ED provider. Management plans discussed with the patient, his wife, daughter, and they are in agreement.  CODE STATUS: DNR per the patient.  TOTAL TIME TAKING CARE OF THIS PATIENT: 58 minutes.    Demetrios Loll M.D on 02/10/2018 at 5:25 PM  Between 7am to 6pm - Pager - [phone]  After 6pm go to [email or web address] - Proofreader  [facility]  Office  [phone]  CC: Primary care physician; [name], MD   Note: This dictation was prepared with Dragon dictation along with smaller phrase technology. Any transcriptional errors that result from this process are unin

## 2018-02-10 NOTE — ED Notes (Signed)
Blue top sent to [facility] ordered at a later time.

## 2018-02-10 NOTE — ED Provider Notes (Signed)
[facility] Emergency Department Provider Note  ____________________________________________   I have reviewed the triage vital signs and the nursing notes. Where available I have reviewed prior notes and, if possible and indicated, outside hospital notes.    HISTORY  Chief Complaint Shortness of Breath    HPI Gary Chang is a 84 y.o. male, 84 year old gentleman with a history of atrial fibrillation in the past COPD, no known history of CHF last EF 55%, presents today with shortness of breath which is significant.  He states that every time he moves he gets winded.  He also has a little chest discomfort when he walks around.  He cannot further describe it.  "It is just uncomfortable".  He does not have any chest pain now.  He has also gained 10 pounds he states in the last week and has orthopnea and paroxysmal nocturnal dyspnea.  Symptoms are worse when he walks he has had no antecedent symptoms, no other associated symptoms, significant shortness of breath but not significant chest pain.  No radiation.  Legs do feel slightly swollen to him.     Past Medical History:  Diagnosis Date  . Abdominal aortic aneurysm (AAA) ([place])   . Allergic state   . Anxiety   . Arthritis   . Cancer ([place])    skin ca removed  . COPD (chronic obstructive pulmonary disease) ([place])   . Depression   . Diabetes mellitus    tingling in feet  . Dyspnea    with exertion  . Enlarged prostate    laser surgery  . GERD (gastroesophageal reflux disease)   . H/O hiatal hernia   . High cholesterol   . Hypertension    pcp  dr Jeneen Rinks [place]  [place]  . Kidney stones   . Melanoma ([place])   . Pneumonia   . Renal artery aneurysm [facility])     Patient Active Problem List   Diagnosis Date Noted  . Abdominal pain 01/30/2017  . Ventral hernia without obstruction or gangrene 01/30/2017  . Tension headache 04/30/2016  . Atypical facial pain 04/30/2016  . Left-sided weakness   .  Occlusion and stenosis of basilar artery   . Vertebral artery stenosis   . Basilar artery stenosis 02/28/2016  . Dizziness and giddiness 11/29/2013  . Abnormality of gait 11/29/2013  . Diabetic neuropathy ([place]) 11/29/2013  . Memory loss 11/29/2013  . Headache, paroxysmal hemicrania, episodic 11/29/2013    Past Surgical History:  Procedure Laterality Date  . ABDOMINAL AORTIC ANEURYSM REPAIR    . BACK SURGERY     lumbar  ,cervical surgeries  . CATARACT EXTRACTION W/PHACO Left 12/11/2016   Procedure: CATARACT EXTRACTION PHACO AND INTRAOCULAR LENS PLACEMENT (IOC);  Surgeon: Leandrew Koyanagi, MD;  Location: [facility] ORS;  Service: Ophthalmology;  Laterality: Left;  [place] 1:17 AP% 17.7 CDE 13.8 FLUID PACK LOT # [id number]  . CATARACT EXTRACTION W/PHACO Right 08/26/2017   Procedure: CATARACT EXTRACTION PHACO AND INTRAOCULAR LENS PLACEMENT ([place]) RIGHT;  Surgeon: Leandrew Koyanagi, MD;  Location: [facility];  Service: Ophthalmology;  Laterality: Right;  IVA TOPICAL DIABETES - oral meds  . ESOPHAGOGASTRODUODENOSCOPY (EGD) WITH PROPOFOL N/A 02/11/2016   Procedure: ESOPHAGOGASTRODUODENOSCOPY (EGD) WITH PROPOFOL;  Surgeon: Hulen Luster, MD;  Location: [facility] ENDOSCOPY;  Service: Gastroenterology;  Laterality: N/A;  . EYE SURGERY     right cataract  . GREEN LIGHT LASER TURP (TRANSURETHRAL RESECTION OF PROSTATE N/A 07/14/2017   Procedure: GREEN LIGHT LASER TURP (TRANSURETHRAL RESECTION OF PROSTATE;  Surgeon: Yves Dill,  Otelia Limes, MD;  Location: [facility] ORS;  Service: Urology;  Laterality: N/A;  . HERNIA REPAIR     inguinal  . KIDNEY STONE SURGERY    . LUMBAR LAMINECTOMY/DECOMPRESSION MICRODISCECTOMY  12/10/2011   Procedure: LUMBAR LAMINECTOMY/DECOMPRESSION MICRODISCECTOMY;  Surgeon: Ophelia Charter;  Location: [facility] NEURO ORS;  Service: Neurosurgery;;  Thoracic One-Two Laminectomy    Prior to Admission medications   Medication Sig Start Date End Date Taking? Authorizing Provider  acetaminophen (TYLENOL)  500 MG tablet Take 500 mg by mouth every 8 (eight) hours as needed for mild pain.    [provider]  albuterol (PROVENTIL HFA;VENTOLIN HFA) 108 (90 Base) MCG/ACT inhaler Inhale 2 puffs into the lungs every 6 (six) hours as needed for wheezing or shortness of breath. 12/12/17   Lisa Roca, MD  apixaban (ELIQUIS) 5 MG TABS tablet Take 1 tablet by mouth 2 (two) times daily.    [provider]  aspirin EC 81 MG tablet Take 81 mg by mouth daily.    [provider]  azithromycin (ZITHROMAX) 250 MG tablet 1 tab daily for 4 more days 12/12/17   Lisa Roca, MD  b complex vitamins tablet Take 1 tablet by mouth daily.    [provider]  benzonatate (TESSALON PERLES) 100 MG capsule Take 1 capsule (100 mg total) by mouth 3 (three) times daily as needed for cough. 12/12/17   Lisa Roca, MD  ciprofloxacin (CIPRO) 500 MG tablet Take 1 tablet (500 mg total) by mouth 2 (two) times daily. Patient not taking: Reported on 08/25/2017 07/14/17   Royston Cowper, MD  dicyclomine (BENTYL) 10 MG capsule Take 10 mg by mouth as needed for spasms.    [provider]  docusate sodium (COLACE) 100 MG capsule Take 2 capsules (200 mg total) by mouth 2 (two) times daily. 07/14/17   Royston Cowper, MD  donepezil (ARICEPT) 5 MG tablet Take 5 mg by mouth every morning. 04/16/16   [provider]  glipiZIDE (GLUCOTROL XL) 2.5 MG 24 hr tablet Take 2.5 mg by mouth at bedtime.      [provider]  GLUCOSAMINE-CHONDROITIN PO Take 1 tablet by mouth 2 (two) times daily.     [provider]  hydrALAZINE (APRESOLINE) 100 MG tablet Take 100 mg by mouth 3 (three) times daily.  11/02/13   [provider]  hydrochlorothiazide (HYDRODIURIL) 25 MG tablet Take 25 mg by mouth every morning.     [provider]  HYDROcodone-acetaminophen (NORCO/VICODIN) 5-325 MG per tablet Take 1 tablet by mouth 2 (two) times daily as needed for moderate pain.  10/25/13    [provider]  losartan (COZAAR) 100 MG tablet Take 100 mg by mouth every evening.     [provider]  Magnesium Oxide 250 MG TABS Take 250 mg by mouth every evening.    [provider]  Methen-Hyosc-Meth Blue-Na Phos (UROGESIC-BLUE) 81.6 MG TABS Take 1 tablet (81.6 mg total) by mouth every 6 (six) hours as needed (dysuria). Patient not taking: Reported on 08/25/2017 07/14/17   Royston Cowper, MD  metoprolol succinate (TOPROL-XL) 25 MG 24 hr tablet Take 25 mg by mouth daily.     [provider]  potassium chloride SA (K-DUR,KLOR-CON) 20 MEQ tablet Take 20 mEq by mouth 2 (two) times daily.    [provider]  sertraline (ZOLOFT) 100 MG tablet Take 100 mg by mouth daily.    [provider]  vitamin B-12 (CYANOCOBALAMIN) 500 MCG tablet Take  500 mcg by mouth daily.    [provider]    Allergies Ezetimibe and Statins  Family History  Problem Relation Age of Onset  . Heart Problems Mother   . Tuberculosis Father   . Anesthesia problems Neg Hx   . Hypotension Neg Hx   . Malignant hyperthermia Neg Hx   . Pseudochol deficiency Neg Hx     Social History Social History   Tobacco Use  . Smoking status: Former Smoker    Types: Pipe    Last attempt to quit: 07/10/1987    Years since quitting: 30.6  . Smokeless tobacco: Never Used  Substance Use Topics  . Alcohol use: Yes    Comment: occ 1-2 beers a month  . Drug use: No    Review of Systems Constitutional: No fever/chills Eyes: No visual changes. ENT: No sore throat. No stiff neck no neck pain Cardiovascular: See HPI. Respiratory: Positive shortness of breath. Gastrointestinal:   no vomiting.  No diarrhea.  No constipation. Genitourinary: Negative for dysuria. Musculoskeletal: Positive lower extremity swelling Skin: Negative for rash. Neurological: Negative for severe headaches, focal weakness or  numbness.   ____________________________________________   PHYSICAL EXAM:  VITAL SIGNS: ED Triage Vitals  Enc Vitals Group     BP 02/10/18 1302 (!) 155/98     Pulse Rate 02/10/18 1302 89     Resp 02/10/18 1302 18     Temp 02/10/18 1302 98.3 F (36.8 C)     Temp Source 02/10/18 1302 Oral     SpO2 02/10/18 1302 97 %     Weight 02/10/18 1303 220 lb (99.8 kg)     Height 02/10/18 1303 5\' 8"  (1.727 m)     Head Circumference --      Peak Flow --      Pain Score 02/10/18 1302 1     Pain Loc --      Pain Edu? --      Excl. in [place]? --     Constitutional: Alert and oriented. Well appearing and in no acute distress. Eyes: Conjunctivae are normal Head: Atraumatic HEENT: No congestion/rhinnorhea. Mucous membranes are moist.  Oropharynx non-erythematous Neck:   Nontender with no meningismus, no masses, no stridor Cardiovascular: irregularly irregular. Grossly normal heart sounds.  Good peripheral circulation. Respiratory: Normal respiratory effort.  No retractions.  Lungs diminished in the bases occasional rales appreciated. Abdominal: Soft and nontender. No distention. No guarding no rebound Back:  There is no focal tenderness or step off.  there is no midline tenderness there are no lesions noted. there is no CVA tenderness Musculoskeletal: No lower extremity tenderness, no upper extremity tenderness. No joint effusions, no DVT signs strong distal pulses positive mild bilateral symmetric pitting edema Neurologic:  Normal speech and language. No gross focal neurologic deficits are appreciated.  Skin:  Skin is warm, dry and intact. No rash noted. Psychiatric: Mood and affect are normal. Speech and behavior are normal.  ____________________________________________   LABS (all labs ordered are listed, but only abnormal results are displayed)  Labs Reviewed  BASIC METABOLIC PANEL - Abnormal; Notable for the following components:      Result Value   Sodium 131 (*)    Potassium 3.3 (*)     Chloride 97 (*)    Glucose, Bld 147 (*)    Calcium 8.8 (*)    GFR calc non Af Amer 55 (*)    All other components within normal limits  CBC - Abnormal; Notable for the following components:  Hemoglobin 11.0 (*)    HCT 35.1 (*)    MCV 74.1 (*)    MCH 23.2 (*)    MCHC 31.3 (*)    RDW 16.9 (*)    All other components within normal limits  BRAIN NATRIURETIC PEPTIDE - Abnormal; Notable for the following components:   B Natriuretic Peptide 284.0 (*)    All other components within normal limits  TROPONIN I    Pertinent labs  results that were available during my care of the patient were reviewed by me and considered in my medical decision making (see chart for details). ____________________________________________  EKG  I personally interpreted any EKGs ordered by me or triage Atrial fibrillation rate 92 bpm no acute ST elevation or depression, normal axis ____________________________________________  RADIOLOGY  Pertinent labs & imaging results that were available during my care of the patient were reviewed by me and considered in my medical decision making (see chart for details). If possible, patient and/or family made aware of any abnormal findings.  Dg Chest 2 View  Result Date: 02/10/2018 CLINICAL DATA:  Shortness of breath beginning last night. Chest discomfort. Cough. EXAM: CHEST  2 VIEW COMPARISON:  12/12/2017 FINDINGS: Mild cardiomegaly. Aortic atherosclerosis. Bilateral pleural effusions, larger on the right than the left, with volume loss at the lung bases. Venous hypertension without frank edema. Upper lungs are clear. IMPRESSION: Probable fluid overload/congestive heart failure. Pulmonary venous hypertension. Cardiomegaly and aortic atherosclerosis. Small effusions with dependent atelectasis, right more than left. Electronically Signed   By: Nelson Chimes M.D.   On: 02/10/2018 13:49   ____________________________________________    PROCEDURES  Procedure(s) performed:  None  Procedures  Critical Care performed: CRITICAL CARE Performed by: Schuyler Amor   Total critical care time: 42 minutes  Critical care time was exclusive of separately billable procedures and treating other patients.  Critical care was necessary to treat or prevent imminent or life-threatening deterioration.  Critical care was time spent personally by me on the following activities: development of treatment plan with patient and/or surrogate as well as nursing, discussions with consultants, evaluation of patient's response to treatment, examination of patient, obtaining history from patient or surrogate, ordering and performing treatments and interventions, ordering and review of laboratory studies, ordering and review of radiographic studies, pulse oximetry and re-evaluation of patient's condition.   ____________________________________________   INITIAL IMPRESSION / ASSESSMENT AND PLAN / ED COURSE  Pertinent labs & imaging results that were available during my care of the patient were reviewed by me and considered in my medical decision making (see chart for details).  Patient here with signs and symptoms of CHF, increasing edema, orthopnea paroxysmal dyspnea dyspnea, chest x-ray which shows some degree of edema elevated BNP, reassuring troponin fortunately.  Patient does have a history of atrial fib in the past he is in A. fib with no acute ischemic changes noted.  We will give him aspirin here, we will give him Lasix, he is not known to be on any diuretics I think he will likely need to be admitted for diuresis.  No evidence of pneumonia, or PE.    ____________________________________________   FINAL CLINICAL IMPRESSION(S) / ED DIAGNOSES  Final diagnoses:  None      This chart was dictated using voice recognition software.  Despite best efforts to proofread,  errors can occur which can change meaning.      Schuyler Amor, MD 02/10/18 [phone]

## 2018-02-11 LAB — CBC
HCT: 34.2 % — ABNORMAL LOW (ref 40.0–52.0)
HEMOGLOBIN: 11.1 g/dL — AB (ref 13.0–18.0)
MCH: 23.6 pg — ABNORMAL LOW (ref 26.0–34.0)
MCHC: 32.3 g/dL (ref 32.0–36.0)
MCV: 73.1 fL — ABNORMAL LOW (ref 80.0–100.0)
PLATELETS: 177 10*3/uL (ref 150–440)
RBC: 4.68 MIL/uL (ref 4.40–5.90)
RDW: 17.1 % — ABNORMAL HIGH (ref 11.5–14.5)
WBC: 9.3 10*3/uL (ref 3.8–10.6)

## 2018-02-11 LAB — GLUCOSE, CAPILLARY
GLUCOSE-CAPILLARY: 124 mg/dL — AB (ref 65–99)
GLUCOSE-CAPILLARY: 101 mg/dL — AB (ref 65–99)
Glucose-Capillary: 156 mg/dL — ABNORMAL HIGH (ref 65–99)
Glucose-Capillary: 108 mg/dL — ABNORMAL HIGH (ref 65–99)

## 2018-02-11 LAB — BASIC METABOLIC PANEL
ANION GAP: 12 (ref 5–15)
BUN: 16 mg/dL (ref 6–20)
CALCIUM: 8.6 mg/dL — AB (ref 8.9–10.3)
CO2: 26 mmol/L (ref 22–32)
CREATININE: 1.27 mg/dL — AB (ref 0.61–1.24)
Chloride: 97 mmol/L — ABNORMAL LOW (ref 101–111)
GFR calc Af Amer: 57 mL/min — ABNORMAL LOW (ref >60)
GFR, EST NON AFRICAN AMERICAN: 49 mL/min — AB (ref >60)
Glucose, Bld: 127 mg/dL — ABNORMAL HIGH (ref 65–99)
Potassium: 2.8 mmol/L — ABNORMAL LOW (ref 3.5–5.1)
Sodium: 135 mmol/L (ref 135–145)

## 2018-02-11 LAB — MAGNESIUM: MAGNESIUM: 1.7 mg/dL (ref 1.7–2.4)

## 2018-02-11 LAB — HEMOGLOBIN A1C
HEMOGLOBIN A1C: 6.7 % — AB (ref 4.8–5.6)
Mean Plasma Glucose: 145.59 mg/dL

## 2018-02-11 LAB — ECHOCARDIOGRAM COMPLETE
Height: 68 in
WEIGHTICAEL: 3496 [oz_av]

## 2018-02-11 MED ORDER — FUROSEMIDE 10 MG/ML IJ SOLN
20.0000 mg | Freq: Two times a day (BID) | INTRAMUSCULAR | Status: DC
  Administered 2018-02-11 – 2018-02-12 (×2): 20 mg via INTRAVENOUS
  Filled 2018-02-11 (×2): qty 2

## 2018-02-11 MED ORDER — MAGNESIUM OXIDE 400 (241.3 MG) MG PO TABS
200.0000 mg | ORAL_TABLET | Freq: Every evening | ORAL | Status: DC
  Administered 2018-02-12: 200 mg via ORAL
  Filled 2018-02-11: qty 1

## 2018-02-11 MED ORDER — POTASSIUM CHLORIDE CRYS ER 20 MEQ PO TBCR
40.0000 meq | EXTENDED_RELEASE_TABLET | Freq: Once | ORAL | Status: AC
  Administered 2018-02-11: 40 meq via ORAL
  Filled 2018-02-11: qty 2

## 2018-02-11 MED ORDER — URELLE 81 MG PO TABS
1.0000 | ORAL_TABLET | Freq: Four times a day (QID) | ORAL | Status: DC
  Administered 2018-02-12: 81 mg via ORAL
  Filled 2018-02-11 (×3): qty 1

## 2018-02-11 MED ORDER — MAGNESIUM SULFATE 2 GM/50ML IV SOLN
2.0000 g | Freq: Once | INTRAVENOUS | Status: AC
  Administered 2018-02-11: 2 g via INTRAVENOUS
  Filled 2018-02-11: qty 50

## 2018-02-11 NOTE — Progress Notes (Signed)
Nutrition Education Note  RD consulted for nutrition education regarding new onset CHF for 84 yo male admitted with acute CHF. PMH COPD, T2DM, GERD, anxiety/depression.    Pt reports he has a good appetite and "probably eat too much". Pt stated that he experienced some fluctuations in weight due to fluid, but denies weight loss.   Pt's daughter and wife were also present for diet education. Daughter is primarily responsible for food purchases and seems to make a lot of effort to ensure pt is eating an overall healthy diet.   Dietary recall:  Breakfast-- shredded wheat with unsweetened almond milk OR toasted raisin bread with PB; coffee with cream & Sweet/Low Lunch/Dinner-- Pt will either snack all day on tangerines/bananas, nuts, activia yogurt, lance crackers and eat dinner prepared by daughter; Or daughter takes pt and pt's wife to lunch and pt snacks in evening Restaurant lunches-- hotdog OR veggie omelet OR vegetable soup  Beverages-- pt reported only drinking water; per daughter, pt also drinks diet [place] and his wife's whole milk   RD provided "Low Sodium Nutrition Therapy" handout from the Academy of [employer]etics. Reviewed patient's dietary recall. Provided examples on ways to decrease sodium intake in diet. Discouraged intake of processed foods (such as limiting intake of lance crackers and hotdogs) and use of salt shaker. Pt denies adding salt to food and stated that foods often taste too salty for him. Reviewed sodium information on a Nutrition Facts label with pt to show him how to limit added salt from processed foods. Encouraged pt to continue to eat fresh fruits like the tangerines/bananas and vegetables as well as whole grain sources of carbohydrates such as the shredded wheat (instead of lance crackers) to maximize fiber intake.   RD discussed why it is important for patient to adhere to diet recommendations, and emphasized the role of fluids, foods to avoid. Teach  back method used.  Expect fair compliance.  Body mass index is 31.57 kg/m. Pt meets criteria for Obesity II based on current BMI.  Current diet order is Heart healthy/carb modified, patient is consuming approximately 100% of meals at this time.   Labs and medications reviewed.  Pt A1c 6.7. Encouraged pt to continue choosing no sugar added almond milk,   No further nutrition interventions warranted at this time. RD contact information provided. If additional nutrition issues arise, please re-consult RD.    Edmonia Lynch, MS Dietetic Intern Pager: [phone]

## 2018-02-11 NOTE — Evaluation (Signed)
Physical Therapy Evaluation Patient Details Name: Gary Chang MRN: [id number] DOB: 1928/12/10 Today's Date: 02/11/2018   History of Present Illness  84 y/o male here with SOB admitted with acute CHF.   Clinical Impression  Pt did well with ambulation showing good confidence, minimal reliance on the walker and no fatigue or safety issues.  He reports that he feels at/close to his baseline and agrees that he will not require further PT intervention to be safe at home.  PT will sign off, no further needs.    Follow Up Recommendations No PT follow up    Equipment Recommendations       Recommendations for Other Services       Precautions / Restrictions Precautions Precautions: Fall Restrictions Weight Bearing Restrictions: No      Mobility  Bed Mobility Overal bed mobility: Independent             General bed mobility comments: Pt easily gets to sitting EOb  Transfers Overall transfer level: Independent Equipment used: Rolling walker (2 wheeled)                Ambulation/Gait Ambulation/Gait assistance: Modified independent (Device/Increase time) Ambulation Distance (Feet): 200 Feet Assistive device: Rolling walker (2 wheeled)       General Gait Details: Pt is able to ambulate with great speed, safety and confidence and reports he is at/near his baseline.  No fatigue.   Stairs            Wheelchair Mobility    Modified Rankin (Stroke Patients Only)       Balance Overall balance assessment: Independent                                           Pertinent Vitals/Pain Pain Assessment: No/denies pain    Home Living Family/patient expects to be discharged to:: Private residence Living Arrangements: Spouse/significant other             Home Equipment: Environmental consultant - 2 wheels;Cane - single point      Prior Function Level of Independence: Independent with assistive device(s)         Comments: Pt out of home  regularly, uses walker in home and [facility] in community     Hand Dominance        Extremity/Trunk Assessment   Upper Extremity Assessment Upper Extremity Assessment: Overall WFL for tasks assessed    Lower Extremity Assessment Lower Extremity Assessment: Overall WFL for tasks assessed       Communication   Communication: No difficulties  Cognition Arousal/Alertness: Awake/alert Behavior During Therapy: WFL for tasks assessed/performed Overall Cognitive Status: Within Functional Limits for tasks assessed                                        General Comments      Exercises     Assessment/Plan    PT Assessment [place] does not need any further PT services  PT Problem List         PT Treatment Interventions      PT Goals (Current goals can be found in the Care Plan section)  Acute Rehab PT Goals Patient Stated Goal: go home PT Goal Formulation: All assessment and education complete, DC therapy    Frequency     Barriers  to discharge        Co-evaluation               AM-PAC PT "6 Clicks" Daily Activity  Outcome Measure Difficulty turning over in bed (including adjusting bedclothes, sheets and blankets)?: None Difficulty moving from lying on back to sitting on the side of the bed? : None Difficulty sitting down on and standing up from a chair with arms (e.g., wheelchair, bedside commode, etc,.)?: None Help needed moving to and from a bed to chair (including a wheelchair)?: None Help needed walking in hospital room?: None Help needed climbing 3-5 steps with a railing? : None 6 Click Score: 24    End of Session Equipment Utilized During Treatment: Gait belt Activity Tolerance: Patient tolerated treatment well Patient left: with bed alarm set;with call bell/phone within reach   PT Visit Diagnosis: Muscle weakness (generalized) (M62.81);Difficulty in walking, not elsewhere classified (R26.2)    Time: 4825-0037 PT Time Calculation (min)  (ACUTE ONLY): 27 min   Charges:   PT Evaluation $PT Eval Low Complexity: 1 Low     PT G CodesKreg Shropshire, DPT 02/11/2018, 5:08 PM

## 2018-02-11 NOTE — Progress Notes (Signed)
[place] [facility] [place] NAME: Gary Chang    MR#:  [id number]  DATE OF BIRTH:  1928-05-22  SUBJECTIVE:  CHIEF COMPLAINT:   Chief Complaint  Patient presents with  . Shortness of Breath   Better shortness of breath, still has cough. REVIEW OF SYSTEMS:  Review of Systems  Constitutional: Positive for malaise/fatigue. Negative for chills and fever.  HENT: Negative for sore throat.   Eyes: Negative for blurred vision and double vision.  Respiratory: Positive for cough and shortness of breath. Negative for hemoptysis, wheezing and stridor.   Cardiovascular: Negative for chest pain, palpitations, orthopnea and leg swelling.  Gastrointestinal: Negative for abdominal pain, blood in stool, diarrhea, melena, nausea and vomiting.  Genitourinary: Negative for dysuria, flank pain and hematuria.  Musculoskeletal: Negative for back pain and joint pain.  Skin: Negative for rash.  Neurological: Negative for dizziness, sensory change, focal weakness, seizures, loss of consciousness, weakness and headaches.  Endo/Heme/Allergies: Negative for polydipsia.  Psychiatric/Behavioral: Negative for depression. The patient is not nervous/anxious.     DRUG ALLERGIES:   Allergies  Allergen Reactions  . Ezetimibe Other (See Comments)    "Numbness and tingling"  . Statins Other (See Comments)    "Numbness and tingling"   VITALS:  Blood pressure 125/71, pulse 100, temperature 98.1 F (36.7 C), temperature source Oral, resp. rate 18, height 5\' 8"  (1.727 m), weight 207 lb 9.6 oz (94.2 kg), SpO2 97 %. PHYSICAL EXAMINATION:  Physical Exam  Constitutional: He is oriented to person, place, and time and well-developed, well-nourished, and in no distress.  HENT:  Head: Normocephalic.  Mouth/Throat: Oropharynx is clear and moist.  Eyes: Conjunctivae and EOM are normal. Pupils are equal, round, and reactive to light. No scleral icterus.  Neck: Normal range of  motion. Neck supple. No JVD present. No tracheal deviation present.  Cardiovascular: Normal rate, regular rhythm and normal heart sounds. Exam reveals no gallop.  No murmur heard. Pulmonary/Chest: Effort normal. No respiratory distress. He has no wheezes. He has rales.  Abdominal: Soft. Bowel sounds are normal. He exhibits no distension. There is no tenderness. There is no rebound.  Musculoskeletal: Normal range of motion. He exhibits no edema or tenderness.  Neurological: He is alert and oriented to person, place, and time. No cranial nerve deficit.  Skin: No rash noted. No erythema.  Psychiatric: Affect normal.   LABORATORY PANEL:  Male CBC Recent Labs  Lab 02/11/18 0332  WBC 9.3  HGB 11.1*  HCT 34.2*  PLT 177   ------------------------------------------------------------------------------------------------------------------ Chemistries  Recent Labs  Lab 02/11/18 0332  NA 135  K 2.8*  CL 97*  CO2 26  GLUCOSE 127*  BUN 16  CREATININE 1.27*  CALCIUM 8.6*  MG 1.7   RADIOLOGY:  No results found. ASSESSMENT AND PLAN:   Acute new onset diastolic CHF. LV EF: 60% -   65% Decreased Lasix 20 mg bid, echo and cardiologist consult. Diuretic therapy for possible heart failure  per Dr. Clayborn Bigness.  Hyponatremia.  Improved. Hypokalemia. Continue KCl and follow-up level.  Chronic Afib. Continue Eliquis and lopressor. Recommend continue anticoagulation for A. fib with Eliquis per Dr. Clayborn Bigness.  HTN, better controlled.  Continue home HTN meds. IV hydralazine prn.  COPD. Stable, NEB prn.  DM. Hold glipizide,  On sliding scale.  All the records are reviewed and case discussed with Care Management/Social Worker. Management plans discussed with the patient, his wife and they are in agreement.  CODE STATUS: DNR  TOTAL TIME TAKING CARE OF THIS PATIENT: 33 minutes.   More than 50% of the time was spent in counseling/coordination of care: YES  POSSIBLE D/C IN 1-2 DAYS,  DEPENDING ON CLINICAL CONDITION.   Demetrios Loll M.D on 02/11/2018 at 2:20 PM  Between 7am to 6pm - Pager - [phone]  After 6pm go to [email or web address] - [facility]

## 2018-02-11 NOTE — Care Management (Addendum)
From home and admitted with what appears to be new onset of CHF.  Has access to scales.  No issues with transportation , getting to MD appointments or paying for meds.  Referral made to dietician regarding diet education for CHF.  Referral to [facility].  Chronic Eliquis.  Patient is agreeable to have home health nurse to follow to reinforce heart failure education treatment management and assessment of response to treatment.   Agreeable to referral to [facility] for home health nurse.  Referral called and accepted by [facility]

## 2018-02-11 NOTE — Consult Note (Addendum)
   [facility] Inpatient Consult   02/11/2018  Gary Chang February 10, 1928 [id number]    [place] Management referral received from inpatient RNCM. Spoke with inpatient RNCM prior to speaking with patient/family.   Spoke with [name] to explain and offer [place] Management program services. [name] is agreeable and verbal consent obtained for [place] Management program.   [name] endorses he lives with his wife.  Denies any issues with medications or with transportation to MD appointments.   Confirms Primary Care MD is Dr. Kary Kos. Confirms best contact number as home number [phone].  Discussed [facility] Care Management will not interfere or replace home health services.  Discussed [facility] Care Management follow up will be for new onset CHF. [name] is agreeable to this.   Will make referral to [facility] for transition of care and for CHF disease and symptom management.   Notification sent to inpatient RNCM to make aware [place] Management will follow post discharge.    Marthenia Rolling, MSN-Ed, RN,BSN [facility] Liaison [phone]

## 2018-02-11 NOTE — Consult Note (Signed)
Reason for Consult: Shortness of breath volume overload possible congestive heart failure A. fib Referring Physician: Dr. Vallarie Mare hospitalist primary physician Dr. [name] Cardiologist Dr. Roxana Hires is an 84 y.o. male.  HPI: Patient is a 84 year old white male history of multiple medical problems came to the emergency room with acute dyspnea shortness of breath.  Patient's had a cough for the last several months has gained about 10 pounds.  Chest x-ray suggested possible volume overload he has a history of pulmonary venous hypertension.  He has had a history of anxiety atrial fibrillation control.  Previous history of smoking and has COPD was making up his bed and got acutely dyspneic so came to the emergency room for assessment evaluation denied any chest pain no blackout spells or syncope.  Patient denies any previous coronary disease.  After receiving treatment in the emergency room he feels much better has reduced dyspnea cough is also improved  Past Medical History:  Diagnosis Date  . Abdominal aortic aneurysm (AAA) ([place])   . Allergic state   . Anxiety   . Arthritis   . Cancer ([place])    skin ca removed  . COPD (chronic obstructive pulmonary disease) ([place])   . Depression   . Diabetes mellitus    tingling in feet  . Dyspnea    with exertion  . Enlarged prostate    laser surgery  . GERD (gastroesophageal reflux disease)   . H/O hiatal hernia   . High cholesterol   . Hypertension    pcp  dr Jeneen Rinks [place]  . Kidney stones   . Melanoma ([place])   . Pneumonia   . Renal artery aneurysm [facility])     Past Surgical History:  Procedure Laterality Date  . ABDOMINAL AORTIC ANEURYSM REPAIR    . BACK SURGERY     lumbar  ,cervical surgeries  . CATARACT EXTRACTION W/PHACO Left 12/11/2016   Procedure: CATARACT EXTRACTION PHACO AND INTRAOCULAR LENS PLACEMENT (IOC);  Surgeon: Leandrew Koyanagi, MD;  Location: [facility] ORS;  Service: Ophthalmology;  Laterality: Left;   [place] 1:17 AP% 17.7 CDE 13.8 FLUID PACK LOT # [id number]  . CATARACT EXTRACTION W/PHACO Right 08/26/2017   Procedure: CATARACT EXTRACTION PHACO AND INTRAOCULAR LENS PLACEMENT ([place]) RIGHT;  Surgeon: Leandrew Koyanagi, MD;  Location: [place];  Service: Ophthalmology;  Laterality: Right;  IVA TOPICAL DIABETES - oral meds  . ESOPHAGOGASTRODUODENOSCOPY (EGD) WITH PROPOFOL N/A 02/11/2016   Procedure: ESOPHAGOGASTRODUODENOSCOPY (EGD) WITH PROPOFOL;  Surgeon: Hulen Luster, MD;  Location: [facility] ENDOSCOPY;  Service: Gastroenterology;  Laterality: N/A;  . EYE SURGERY     right cataract  . GREEN LIGHT LASER TURP (TRANSURETHRAL RESECTION OF PROSTATE N/A 07/14/2017   Procedure: GREEN LIGHT LASER TURP (TRANSURETHRAL RESECTION OF PROSTATE;  Surgeon: Royston Cowper, MD;  Location: [facility] ORS;  Service: Urology;  Laterality: N/A;  . HERNIA REPAIR     inguinal  . KIDNEY STONE SURGERY    . LUMBAR LAMINECTOMY/DECOMPRESSION MICRODISCECTOMY  12/10/2011   Procedure: LUMBAR LAMINECTOMY/DECOMPRESSION MICRODISCECTOMY;  Surgeon: Ophelia Charter;  Location: [facility] NEURO ORS;  Service: Neurosurgery;;  Thoracic One-Two Laminectomy    Family History  Problem Relation Age of Onset  . Heart Problems Mother   . Tuberculosis Father   . Anesthesia problems Neg Hx   . Hypotension Neg Hx   . Malignant hyperthermia Neg Hx   . Pseudochol deficiency Neg Hx     Social History:  reports that he quit smoking about 30 years ago.  His smoking use included pipe. he has never used smokeless tobacco. He reports that he drinks alcohol. He reports that he does not use drugs.  Allergies:  Allergies  Allergen Reactions  . Ezetimibe Other (See Comments)    "Numbness and tingling"  . Statins Other (See Comments)    "Numbness and tingling"    Medications: I have reviewed the patient's current medications.  Results for orders placed or performed during the hospital encounter of 02/10/18 (from the past 48 hour(s))  Basic metabolic  panel     Status: Abnormal   Collection Time: 02/10/18  1:06 PM  Result Value Ref Range   Sodium 131 (L) 135 - 145 mmol/L   Potassium 3.3 (L) 3.5 - 5.1 mmol/L   Chloride 97 (L) 101 - 111 mmol/L   CO2 24 22 - 32 mmol/L   Glucose, Bld 147 (H) 65 - 99 mg/dL   BUN 18 6 - 20 mg/dL   Creatinine, Ser 1.15 0.61 - 1.24 mg/dL   Calcium 8.8 (L) 8.9 - 10.3 mg/dL   GFR calc non Af Amer 55 (L) >60 mL/min   GFR calc Af Amer >60 >60 mL/min    Comment: (NOTE) The eGFR has been calculated using the CKD EPI equation. This calculation has not been validated in all clinical situations. eGFR's persistently <60 mL/min signify possible Chronic Kidney Disease.    Anion gap 10 5 - 15    Comment: Performed at [facility], [address] [place], [place] [address]  CBC     Status: Abnormal   Collection Time: 02/10/18  1:06 PM  Result Value Ref Range   WBC 10.6 3.8 - 10.6 K/uL   RBC 4.74 4.40 - 5.90 MIL/uL   Hemoglobin 11.0 (L) 13.0 - 18.0 g/dL   HCT 35.1 (L) 40.0 - 52.0 %   MCV 74.1 (L) 80.0 - 100.0 fL   MCH 23.2 (L) 26.0 - 34.0 pg   MCHC 31.3 (L) 32.0 - 36.0 g/dL   RDW 16.9 (H) 11.5 - 14.5 %   Platelets 198 150 - 440 K/uL    Comment: Performed at [facility], [address] [place], [place] [address]  Troponin I     Status: None   Collection Time: 02/10/18  1:06 PM  Result Value Ref Range   Troponin I <0.03 <0.03 ng/mL    Comment: Performed at [facility], [address] [place], [place] [address]  Brain natriuretic peptide     Status: Abnormal   Collection Time: 02/10/18  1:06 PM  Result Value Ref Range   B Natriuretic Peptide 284.0 (H) 0.0 - 100.0 pg/mL    Comment: Performed at [facility], [address] [place], [place] [address]  Glucose, capillary     Status: Abnormal   Collection Time: 02/10/18  8:10 PM  Result Value Ref Range   Glucose-Capillary 132 (H) 65 - 99 mg/dL  Basic metabolic panel     Status: Abnormal   Collection Time: 02/11/18   3:32 AM  Result Value Ref Range   Sodium 135 135 - 145 mmol/L   Potassium 2.8 (L) 3.5 - 5.1 mmol/L   Chloride 97 (L) 101 - 111 mmol/L   CO2 26 22 - 32 mmol/L   Glucose, Bld 127 (H) 65 - 99 mg/dL   BUN 16 6 - 20 mg/dL   Creatinine, Ser 1.27 (H) 0.61 - 1.24 mg/dL   Calcium 8.6 (L) 8.9 - 10.3 mg/dL   GFR calc non  Af Amer 49 (L) >60 mL/min   GFR calc Af Amer 57 (L) >60 mL/min    Comment: (NOTE) The eGFR has been calculated using the CKD EPI equation. This calculation has not been validated in all clinical situations. eGFR's persistently <60 mL/min signify possible Chronic Kidney Disease.    Anion gap 12 5 - 15    Comment: Performed at [facility], [address] [place], [place] [address]  CBC     Status: Abnormal   Collection Time: 02/11/18  3:32 AM  Result Value Ref Range   WBC 9.3 3.8 - 10.6 K/uL   RBC 4.68 4.40 - 5.90 MIL/uL   Hemoglobin 11.1 (L) 13.0 - 18.0 g/dL   HCT 34.2 (L) 40.0 - 52.0 %   MCV 73.1 (L) 80.0 - 100.0 fL   MCH 23.6 (L) 26.0 - 34.0 pg   MCHC 32.3 32.0 - 36.0 g/dL   RDW 17.1 (H) 11.5 - 14.5 %   Platelets 177 150 - 440 K/uL    Comment: Performed at [facility], [address] [place], [place] [address]  Magnesium     Status: None   Collection Time: 02/11/18  3:32 AM  Result Value Ref Range   Magnesium 1.7 1.7 - 2.4 mg/dL    Comment: Performed at [facility], [address] [place], [place] [address]  Glucose, capillary     Status: Abnormal   Collection Time: 02/11/18  8:04 AM  Result Value Ref Range   Glucose-Capillary 156 (H) 65 - 99 mg/dL    Dg Chest 2 View  Result Date: 02/10/2018 CLINICAL DATA:  Shortness of breath beginning last night. Chest discomfort. Cough. EXAM: CHEST  2 VIEW COMPARISON:  12/12/2017 FINDINGS: Mild cardiomegaly. Aortic atherosclerosis. Bilateral pleural effusions, larger on the right than the left, with volume loss at the lung bases. Venous hypertension without frank edema. Upper lungs are clear.  IMPRESSION: Probable fluid overload/congestive heart failure. Pulmonary venous hypertension. Cardiomegaly and aortic atherosclerosis. Small effusions with dependent atelectasis, right more than left. Electronically Signed   By: Nelson Chimes M.D.   On: 02/10/2018 13:49    Review of Systems  Constitutional: Positive for malaise/fatigue.  HENT: Positive for congestion.   Eyes: Negative.   Respiratory: Positive for shortness of breath.   Cardiovascular: Positive for palpitations.  Gastrointestinal: Negative.   Genitourinary: Negative.   Musculoskeletal: Positive for myalgias.  Skin: Negative.   Neurological: Negative.   Endo/Heme/Allergies: Negative.   Psychiatric/Behavioral: Negative.    Blood pressure 125/71, pulse 100, temperature 98.1 F (36.7 C), temperature source Oral, resp. rate 18, height '5\' 8"'  (1.727 m), weight 207 lb 9.6 oz (94.2 kg), SpO2 97 %. Physical Exam  Nursing note and vitals reviewed. Constitutional: He is oriented to person, place, and time. He appears well-developed and well-nourished.  HENT:  Head: Normocephalic and atraumatic.  Eyes: Conjunctivae and EOM are normal. Pupils are equal, round, and reactive to light.  Neck: Normal range of motion. Neck supple.  Cardiovascular: Normal rate, S1 normal, S2 normal and normal pulses. An irregularly irregular rhythm present.  Murmur heard.  Systolic murmur is present with a grade of 2/6. Respiratory: Effort normal and breath sounds normal.  GI: Bowel sounds are normal.  Musculoskeletal: Normal range of motion.  Neurological: He is alert and oriented to person, place, and time. He has normal reflexes.  Skin: Skin is warm and dry.  Psychiatric: He has a normal mood and affect.    Assessment/Plan: Shortness of breath Atrial  fibrillation Anxiety COPD Hypertension Diabetes GERD Pulmonary venous hypertension Possible URI . Plan Agree with admit to telemetry Rule out for myocardial infarction follow-up  EKGs Continue troponins and enzymes Recommend continue anticoagulation for A. fib with Eliquis Diuretic therapy for possible heart failure Supplemental oxygen as necessary Continue blood pressure control Inhalers as necessary for COPD Replace potassium for mild hypokalemia Continue diabetes management control Consider echocardiogram for assessment of left ventricular function Consider antibiotic therapy consider short course of steroid therapy No clear evidence of pulmonary embolus but continue anticoagulation  Dwayne D Callwood 02/11/2018, 8:44 AM

## 2018-02-11 NOTE — Progress Notes (Signed)
New onset CHF for 84 yo male admitted with acute CHF. PMH COPD, T2DM, GERD, anxiety/depression.    CHF Education:?? Educational session with patient completed.  Provided patient with "Living Better with Heart Failure" packet. Briefly reviewed definition of heart failure and signs and symptoms of an exacerbation.?Explained to patient that HF is a chronic illness which requires self-assessment / self-management along with help from the cardiologist/PCP.??  *Reviewed importance of and reason behind checking weight daily in the AM, after using the bathroom, but before getting dressed. Patient has scales.    *Reviewed with patient the following information: *Discussed when to call the Dr= weight gain of >2-3lb overnight of 5lb in a week,  *Discussed yellow zone= call MD: weight gain of >2-3lb overnight of 5lb in a week, increased swelling, increased SOB when lying down, chest discomfort, dizziness, increased fatigue *Red Zone= call 911: struggle to breath, fainting or near fainting, significant chest pain  *Reviewed low sodium diet-provided handout of recommended and not recommended foods.?  *Diet - Dietitian Consultation for education completed today.   *Discussed fluid intake with patient as well. Patient not currently on a fluid restriction, but advised no more than 8-8 ounces glass of fluids per day.?  *Instructed patient to take medications as prescribed for heart failure. Explained briefly why pt is on the medications (either make you feel better, live longer or keep you out of the hospital) and discussed monitoring and side effects.   *Discussed exercise.  Patient informed this RN that he used to walk, but he is unable to do so now due to his left knee being bone on bone.  He has been seen by orthopedic surgeon who did not recommend surgery given his age.  Encouraged patient to be as active as possible.  Patient also helps care for his wife who had a stroke in the past.    *Smoking Cessation-  Patient is a former smoker.?  *[facility] Failure [facility] - Explained the purpose of the HF Clinic. ?Explained to patient  the HF Clinic does not replace PCP nor Cardiologist, but is an additional resource to helping patient manage heart failure at home. Patient does not wish to be followed in the [facility] Clinic.  As a result, the appointment which had been made for the patient in the [facility] was cancelled per patient's request.   ? Again, the 5 Steps to Living Better with Heart Failure were reviewed with patient. The Heart Failure Handbook was also given to the patient as an additional resource / information on HF.    Patient thanked me for providing the above information. ?  Roanna Epley, RN, BSN, [facility]? [place] [facility]  Cardiovascular &?Pulmonary Nurse Navigator  Direct Line: [phone]  Department Phone #: [phone] Fax: [phone]? Email Address: [email or web address]?

## 2018-02-12 LAB — BASIC METABOLIC PANEL
ANION GAP: 11 (ref 5–15)
BUN: 18 mg/dL (ref 6–20)
CHLORIDE: 95 mmol/L — AB (ref 101–111)
CO2: 25 mmol/L (ref 22–32)
Calcium: 8.4 mg/dL — ABNORMAL LOW (ref 8.9–10.3)
Creatinine, Ser: 1.34 mg/dL — ABNORMAL HIGH (ref 0.61–1.24)
GFR calc Af Amer: 53 mL/min — ABNORMAL LOW (ref >60)
GFR, EST NON AFRICAN AMERICAN: 46 mL/min — AB (ref >60)
GLUCOSE: 126 mg/dL — AB (ref 65–99)
POTASSIUM: 3 mmol/L — AB (ref 3.5–5.1)
Sodium: 131 mmol/L — ABNORMAL LOW (ref 135–145)

## 2018-02-12 LAB — GLUCOSE, CAPILLARY
GLUCOSE-CAPILLARY: 139 mg/dL — AB (ref 65–99)
Glucose-Capillary: 140 mg/dL — ABNORMAL HIGH (ref 65–99)

## 2018-02-12 LAB — MAGNESIUM: Magnesium: 1.9 mg/dL (ref 1.7–2.4)

## 2018-02-12 MED ORDER — FUROSEMIDE 20 MG PO TABS
20.0000 mg | ORAL_TABLET | Freq: Once | ORAL | Status: AC
  Administered 2018-02-12: 20 mg via ORAL
  Filled 2018-02-12: qty 1

## 2018-02-12 MED ORDER — POTASSIUM CHLORIDE CRYS ER 20 MEQ PO TBCR
40.0000 meq | EXTENDED_RELEASE_TABLET | Freq: Once | ORAL | Status: AC
  Administered 2018-02-12: 40 meq via ORAL
  Filled 2018-02-12: qty 2

## 2018-02-12 MED ORDER — FUROSEMIDE 40 MG PO TABS: 20.0000 mg | ORAL_TABLET | Freq: Every day | ORAL | 1 refills | Status: DC

## 2018-02-12 NOTE — Discharge Instructions (Signed)
Heart healthy and ADA diet. [place] with RN

## 2018-02-12 NOTE — Care Management Important Message (Signed)
Important Message  Patient Details  Name: Gary Chang MRN: [id number] Date of Birth: 1928/05/02   Medicare Important Message Given:  Yes Signed IM notice given   Katrina Stack, RN 02/12/2018, 7:46 AM

## 2018-02-12 NOTE — Discharge Summary (Signed)
[facility] NAME: Gary Chang    MR#:  [id number]  DATE OF BIRTH:  [birth year]  DATE OF ADMISSION:  02/10/2018   ADMITTING PHYSICIAN: Demetrios Loll, MD  DATE OF DISCHARGE: 02/12/2018 12:20 PM  PRIMARY CARE PHYSICIAN: [name], MD   ADMISSION DIAGNOSIS:  Atrial fibrillation, unspecified type ([place]) [I48.91] Congestive heart failure, unspecified HF chronicity, unspecified heart failure type ([place]) [I50.9] DISCHARGE DIAGNOSIS:  Active Problems:   Acute CHF (congestive heart failure) ([place])  SECONDARY DIAGNOSIS:   Past Medical History:  Diagnosis Date  . Abdominal aortic aneurysm (AAA) ([place])   . Allergic state   . Anxiety   . Arthritis   . Cancer ([place])    skin ca removed  . COPD (chronic obstructive pulmonary disease) ([place])   . Depression   . Diabetes mellitus    tingling in feet  . Dyspnea    with exertion  . Enlarged prostate    laser surgery  . GERD (gastroesophageal reflux disease)   . H/O hiatal hernia   . High cholesterol   . Hypertension    pcp  dr Jeneen Rinks [place]  . Kidney stones   . Melanoma ([place])   . Pneumonia   . Renal artery aneurysm [facility])    HOSPITAL COURSE:   Acute new onset diastolic CHF. LV EF: 60% - 65% Decreased Lasix 20 mg daily. Diuretic therapy for possible heart failure  per Dr. Clayborn Bigness. Follow-up with Dr. Nehemiah Massed as outpatient.  Hyponatremia.  looks chronic. Hypokalemia. Continue KCl and follow-up level as outpatient.  Chronic Afib. Continue Eliquis and lopressor. Recommend continue anticoagulation for A. fib with Eliquis per Dr. Clayborn Bigness.  HTN, better controlled.  Continue home HTN meds.IV hydralazine prn.  COPD. Stable, NEB prn.  DM.  Resume glipizide,  On sliding scale.  DISCHARGE CONDITIONS:  Stable, discharged to home with home health RN today. CONSULTS OBTAINED:  Treatment Team:  Yolonda Kida, MD DRUG ALLERGIES:   Allergies  Allergen  Reactions  . Ezetimibe Other (See Comments)    "Numbness and tingling"  . Statins Other (See Comments)    "Numbness and tingling"   DISCHARGE MEDICATIONS:   Allergies as of 02/12/2018      Reactions   Ezetimibe Other (See Comments)   "Numbness and tingling"   Statins Other (See Comments)   "Numbness and tingling"      Medication List    STOP taking these medications   azithromycin 250 MG tablet Commonly known as:  ZITHROMAX   ciprofloxacin 500 MG tablet Commonly known as:  CIPRO   hydrochlorothiazide 25 MG tablet Commonly known as:  HYDRODIURIL   URO-MP 118 MG Caps   UROGESIC-BLUE 81.6 MG Tabs     TAKE these medications   acetaminophen 500 MG tablet Commonly known as:  TYLENOL Take 500 mg by mouth every 8 (eight) hours as needed for mild pain.   albuterol 108 (90 Base) MCG/ACT inhaler Commonly known as:  PROVENTIL HFA;VENTOLIN HFA Inhale 2 puffs into the lungs every 6 (six) hours as needed for wheezing or shortness of breath.   aspirin EC 81 MG tablet Take 81 mg by mouth daily.   b complex vitamins tablet Take 1 tablet by mouth daily.   benzonatate 100 MG capsule Commonly known as:  TESSALON PERLES Take 1 capsule (100 mg total) by mouth 3 (three) times daily as needed for cough.   cholecalciferol 1000 units tablet Commonly known as:  VITAMIN D  Take 1,000 Units by mouth daily.   dicyclomine 10 MG capsule Commonly known as:  BENTYL Take 10 mg by mouth 4 (four) times daily as needed for spasms.   docusate sodium 100 MG capsule Commonly known as:  COLACE Take 2 capsules (200 mg total) by mouth 2 (two) times daily.   donepezil 5 MG tablet Commonly known as:  ARICEPT Take 5 mg by mouth every morning.   ELIQUIS 5 MG Tabs tablet Generic drug:  apixaban Take 1 tablet by mouth 2 (two) times daily.   furosemide 40 MG tablet Commonly known as:  LASIX Take 0.5 tablets (20 mg total) by mouth daily.   glipiZIDE 2.5 MG 24 hr tablet Commonly known as:   GLUCOTROL XL Take 2.5 mg by mouth at bedtime.   GLUCOSAMINE-CHONDROITIN PO Take 1 tablet by mouth 2 (two) times daily.   hydrALAZINE 100 MG tablet Commonly known as:  APRESOLINE Take 100 mg by mouth 3 (three) times daily.   HYDROcodone-acetaminophen 5-325 MG tablet Commonly known as:  NORCO/VICODIN Take 1 tablet by mouth 2 (two) times daily as needed for moderate pain.   losartan 100 MG tablet Commonly known as:  COZAAR Take 100 mg by mouth every evening.   Magnesium Oxide 250 MG Tabs Take 250 mg by mouth every evening.   metoprolol succinate 50 MG 24 hr tablet Commonly known as:  TOPROL-XL Take 50 mg by mouth daily.   potassium chloride SA 20 MEQ tablet Commonly known as:  K-DUR,KLOR-CON Take 20 mEq by mouth 2 (two) times daily.   sertraline 100 MG tablet Commonly known as:  ZOLOFT Take 100 mg by mouth daily.        DISCHARGE INSTRUCTIONS:  See AVS. If you experience worsening of your admission symptoms, develop shortness of breath, life threatening emergency, suicidal or homicidal thoughts you must seek medical attention immediately by calling 911 or calling your MD immediately  if symptoms less severe.  You Must read complete instructions/literature along with all the possible adverse reactions/side effects for all the Medicines you take and that have been prescribed to you. Take any new Medicines after you have completely understood and accpet all the possible adverse reactions/side effects.   Please note  You were cared for by a hospitalist during your hospital stay. If you have any questions about your discharge medications or the care you received while you were in the hospital after you are discharged, you can call the unit and asked to speak with the hospitalist on call if the hospitalist that took care of you is not available. Once you are discharged, your primary care physician will handle any further medical issues. Please note that NO REFILLS for any  discharge medications will be authorized once you are discharged, as it is imperative that you return to your primary care physician (or establish a relationship with a primary care physician if you do not have one) for your aftercare needs so that they can reassess your need for medications and monitor your lab values.    On the day of Discharge:  VITAL SIGNS:  Blood pressure 125/78, pulse 90, temperature 98.1 F (36.7 C), temperature source Oral, resp. rate 14, height 5\' 8"  (1.727 m), weight 208 lb 6.4 oz (94.5 kg), SpO2 97 %. PHYSICAL EXAMINATION:  GENERAL:  84 y.o.-year-old patient lying in the bed with no acute distress.  EYES: Pupils equal, round, reactive to light and accommodation. No scleral icterus. Extraocular muscles intact.  HEENT: Head atraumatic, normocephalic. Oropharynx and  nasopharynx clear.  NECK:  Supple, no jugular venous distention. No thyroid enlargement, no tenderness.  LUNGS: Normal breath sounds bilaterally, no wheezing, rales,rhonchi or crepitation. No use of accessory muscles of respiration.  CARDIOVASCULAR: Irregular rate and rhythm, No murmurs, rubs, or gallops.  ABDOMEN: Soft, non-tender, non-distended. Bowel sounds present. No organomegaly or mass.  EXTREMITIES: No pedal edema, cyanosis, or clubbing.  NEUROLOGIC: Cranial nerves II through XII are intact. Muscle strength 5/5 in all extremities. Sensation intact. Gait not checked.  PSYCHIATRIC: The patient is alert and oriented x 3.  SKIN: No obvious rash, lesion, or ulcer.  DATA REVIEW:   CBC Recent Labs  Lab 02/11/18 0332  WBC 9.3  HGB 11.1*  HCT 34.2*  PLT 177    Chemistries  Recent Labs  Lab 02/12/18 0516  NA 131*  K 3.0*  CL 95*  CO2 25  GLUCOSE 126*  BUN 18  CREATININE 1.34*  CALCIUM 8.4*  MG 1.9     Microbiology Results  Results for orders placed or performed during the hospital encounter of 12/12/17  Culture, blood (routine x 2)     Status: None   Collection Time: 12/12/17  7:59  PM  Result Value Ref Range Status   Specimen Description BLOOD BLOOD LEFT HAND  Final   Special Requests   Final    BOTTLES DRAWN AEROBIC AND ANAEROBIC Blood Culture adequate volume   Culture   Final    NO GROWTH 5 DAYS Performed at [facility], [address] [place], [place] [address]    Report Status 12/17/2017 FINAL  Final  Culture, blood (routine x 2)     Status: None   Collection Time: 12/12/17  7:59 PM  Result Value Ref Range Status   Specimen Description BLOOD BLOOD RIGHT FOREARM  Final   Special Requests   Final    BOTTLES DRAWN AEROBIC AND ANAEROBIC Blood Culture results may not be optimal due to an excessive volume of blood received in culture bottles   Culture   Final    NO GROWTH 5 DAYS Performed at [facility], [address], [place], [place] [address]    Report Status 12/17/2017 FINAL  Final    RADIOLOGY:  No results found.   Management plans discussed with the patient, family and they are in agreement.  CODE STATUS: DNR   TOTAL TIME TAKING CARE OF THIS PATIENT: 33 minutes.    Demetrios Loll M.D on 02/12/2018 at 3:24 PM  Between 7am to 6pm - Pager - [phone]  After 6pm go to [email or web address] - Proofreader  [facility]  Office  [phone]  CC: Primary care physician; [name], MD   Note: This dictation was prepared with Dragon dictation along with smaller phrase technology. Any transcriptional errors that result from this process are unintentional.

## 2018-02-12 NOTE — Discharge Planning (Signed)
Discharge instructions reviewed with pt. Pt verbalizes understanding. Pt ready for discharge home. 

## 2018-02-12 NOTE — Care Management (Signed)
Notified [facility] of discharge today and patient will be seen hopefully tomorrow.  Accepted [facility]

## 2018-02-15 ENCOUNTER — Other Ambulatory Visit: Payer: Self-pay | Admitting: *Deleted

## 2018-02-15 DIAGNOSIS — E1140 Type 2 diabetes mellitus with diabetic neuropathy, unspecified: Secondary | ICD-10-CM | POA: Diagnosis not present

## 2018-02-15 DIAGNOSIS — I110 Hypertensive heart disease with heart failure: Secondary | ICD-10-CM | POA: Diagnosis not present

## 2018-02-15 DIAGNOSIS — I482 Chronic atrial fibrillation: Secondary | ICD-10-CM | POA: Diagnosis not present

## 2018-02-15 DIAGNOSIS — J439 Emphysema, unspecified: Secondary | ICD-10-CM | POA: Diagnosis not present

## 2018-02-15 DIAGNOSIS — I5031 Acute diastolic (congestive) heart failure: Secondary | ICD-10-CM | POA: Diagnosis not present

## 2018-02-15 NOTE — Patient Outreach (Signed)
02/15/2018   Unsuccessful telephone encounters x 2  to Gary Chang, 84 year old male, follow up on referral received 02/12/2018 from [facility] hospital liaison for Community CM services, Transition of care,recent hospitalization February 20-22,2019 for Atrial fib, CHF (new onset).    Pt's history includes but not limited to Diabetic neuropathy, basilar artery stenosis, memory loss, left sided  Weakness.   Male person answering the phone hung up after RN CM requested to talk with pt so called back and received a voice  Message.   HIPAA compliant voice message left with RN CM's contact information.    Plan:  If no response to voice message left, plan to call again later today.    [name].   [facility]  [phone]

## 2018-02-16 ENCOUNTER — Encounter: Payer: Self-pay | Admitting: *Deleted

## 2018-02-16 ENCOUNTER — Other Ambulatory Visit: Payer: Self-pay | Admitting: *Deleted

## 2018-02-16 NOTE — Patient Outreach (Signed)
02/16/18   Successful telephone encounter to Gary Chang, 84 year old male- follow up on referral received 02/12/18 from [place] hospital liaison for Community CM services, transition of care,recent hospitalization February 20-22,2019 for Atrial fib, CHF (new onset). Pt's history includes but not limited to Diabetic neuropathy, basilar artery stenosis, memory loss, left sided weakness.  Spoke with pt, HIPAA  Identifiers verified, discussed purpose of call- follow up on referral/recent  Hospitalization.  Pt reports doing okay since discharge home, sob all the time, getting worse, been weighing daily/recording- weight yesterday and today  204 lbs, no edema.   Pt reports [place] services started, went over when to call MD- weight gain.   Pt report taking all of his medications per discharge papers,no  Problems affording, unable to review medications with RN CM at this time, still in bed.   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   Pt reports has MD follow up  Appointments- to see Heart MD 3/4, PCP not sure of date/marked down.  Pt reports his has issues with his memory, can't remember at times.  RN CM  Discussed with pt [facility] transition of care program- follow for 31 days (weekly  Calls, a home visit) benefit of his insurance/no cost to which pt agreed.   Plan:  As discussed with pt, plan to follow up again next week for initial home  Visit.            Plan to send Dr. Chrystine Oiler [facility] barrier letter - informing of [facility]   involvement.    [name].   [facility]  [phone]

## 2018-02-17 DIAGNOSIS — R351 Nocturia: Secondary | ICD-10-CM | POA: Diagnosis not present

## 2018-02-17 DIAGNOSIS — R350 Frequency of micturition: Secondary | ICD-10-CM | POA: Diagnosis not present

## 2018-02-17 DIAGNOSIS — R3914 Feeling of incomplete bladder emptying: Secondary | ICD-10-CM | POA: Diagnosis not present

## 2018-02-17 DIAGNOSIS — R3916 Straining to void: Secondary | ICD-10-CM | POA: Diagnosis not present

## 2018-02-19 ENCOUNTER — Ambulatory Visit: Payer: PPO | Admitting: Family

## 2018-02-19 DIAGNOSIS — J439 Emphysema, unspecified: Secondary | ICD-10-CM | POA: Diagnosis not present

## 2018-02-19 DIAGNOSIS — E1140 Type 2 diabetes mellitus with diabetic neuropathy, unspecified: Secondary | ICD-10-CM | POA: Diagnosis not present

## 2018-02-19 DIAGNOSIS — I110 Hypertensive heart disease with heart failure: Secondary | ICD-10-CM | POA: Diagnosis not present

## 2018-02-19 DIAGNOSIS — I482 Chronic atrial fibrillation: Secondary | ICD-10-CM | POA: Diagnosis not present

## 2018-02-19 DIAGNOSIS — I5031 Acute diastolic (congestive) heart failure: Secondary | ICD-10-CM | POA: Diagnosis not present

## 2018-02-23 ENCOUNTER — Other Ambulatory Visit: Payer: Self-pay | Admitting: *Deleted

## 2018-02-23 DIAGNOSIS — N401 Enlarged prostate with lower urinary tract symptoms: Secondary | ICD-10-CM | POA: Diagnosis not present

## 2018-02-23 NOTE — Patient Outreach (Addendum)
[facility]) Care Management   02/23/2018  TC [place] [birth year] [id number]  Gary Chang is an 84 y.o. male  Subjective: Pt reports has a little discomfort in mid chest area (+1 on pain scale), sob but  Then by the end of home visit pt reports chest pain gone/no sob.  Pt reports on daily  Weights, ranges 214 to 215 lbs, today 214 lbs.   Pt reports heart racing after  Ambulating, to see Heart MD 3/12 and Dr. Rogers Blocker (Urologist) today.     Objective:   Vitals:   02/23/18 1052  BP: 128/62  Pulse: 90  SpO2: 98%    ROS  Physical Exam  Constitutional: He is oriented to person, place, and time. He appears well-developed and well-nourished.  Cardiovascular:  Heart racing after ambulation- 111, decreased to 92 shortly after at rest.   Slightly irregular   Respiratory: Effort normal.  Slight crackles noted in posterior left lower lobe.   GI: Soft.  Musculoskeletal: Normal range of motion. He exhibits no edema.  Neurological: He is alert and oriented to person, place, and time.  Skin: Skin is warm and dry.  Psychiatric: He has a normal mood and affect. His behavior is normal. Judgment and thought content normal.    Encounter Medications:   Outpatient Encounter Medications as of 02/23/2018  Medication Sig Note  . acetaminophen (TYLENOL) 500 MG tablet Take 500 mg by mouth every 8 (eight) hours as needed for mild pain. 02/23/2018: As needed.   [place] Kitchen aspirin EC 81 MG tablet Take 81 mg by mouth daily.   [place] Kitchen b complex vitamins tablet Take 1 tablet by mouth daily.   [place] Kitchen dicyclomine (BENTYL) 10 MG capsule Take 10 mg by mouth 4 (four) times daily as needed for spasms.    [place] Kitchen donepezil (ARICEPT) 5 MG tablet Take 5 mg by mouth every morning.   . furosemide (LASIX) 40 MG tablet Take 0.5 tablets (20 mg total) by mouth daily.   [place] Kitchen glipiZIDE (GLUCOTROL XL) 2.5 MG 24 hr tablet Take 2.5 mg by mouth at bedtime.     [place] Kitchen GLUCOSAMINE-CHONDROITIN PO Take 1 tablet by mouth 2 (two) times daily.     . hydrALAZINE (APRESOLINE) 100 MG tablet Take 100 mg by mouth 3 (three) times daily.    . Magnesium Oxide 250 MG TABS Take 250 mg by mouth every evening.   . metoprolol succinate (TOPROL-XL) 50 MG 24 hr tablet Take 50 mg by mouth daily.    . potassium chloride SA (K-DUR,KLOR-CON) 20 MEQ tablet Take 20 mEq by mouth 2 (two) times daily.   . sertraline (ZOLOFT) 100 MG tablet Take 100 mg by mouth daily.   [place] Kitchen albuterol (PROVENTIL HFA;VENTOLIN HFA) 108 (90 Base) MCG/ACT inhaler Inhale 2 puffs into the lungs every 6 (six) hours as needed for wheezing or shortness of breath. (Patient not taking: Reported on 02/10/2018)   . apixaban (ELIQUIS) 5 MG TABS tablet Take 1 tablet by mouth 2 (two) times daily.   . benzonatate (TESSALON PERLES) 100 MG capsule Take 1 capsule (100 mg total) by mouth 3 (three) times daily as needed for cough. (Patient not taking: Reported on 02/10/2018)   . cholecalciferol (VITAMIN D) 1000 units tablet Take 1,000 Units by mouth daily.    [place] Kitchen docusate sodium (COLACE) 100 MG capsule Take 2 capsules (200 mg total) by mouth 2 (two) times daily. (Patient not taking: Reported on 02/23/2018)   . HYDROcodone-acetaminophen (NORCO/VICODIN) 5-325 MG per tablet Take 1 tablet by  mouth 2 (two) times daily as needed for moderate pain.    [place] Kitchen losartan (COZAAR) 100 MG tablet Take 100 mg by mouth every evening.     No facility-administered encounter medications on file as of 02/23/2018.     Functional Status:   In your present state of health, do you have any difficulty performing the following activities: 02/23/2018 02/10/2018  Hearing? Tempie Donning  Vision? N N  Difficulty concentrating or making decisions? N N  Comment - -  Walking or climbing stairs? N N  Dressing or bathing? N N  Doing errands, shopping? N N  Preparing Food and eating ? N -  Using the Toilet? N -  Managing your Medications? N -  Housekeeping or managing your Housekeeping? N -  Some recent data might be hidden    Fall/Depression  Screening:    Fall Risk  04/28/2016  Falls in the past year? No   No flowsheet data found.  Assessment:  Pleasant 84 year old male, resides with spouse, son close by assists  As needed.   RN CM following pt for transition of care/recent hospitalization February 20-22,2019 for Atrial fib, CHF (new onset).   Pt's history includes but not limited to  Diabetic neuropathy, Basilar artery weakness, Paroxysmal hemicrania headache.     CHF:  Per pt reports weights staying 214-215 lbs, today 214 lbs.  No edema.               Crackles noted in posterior left lower lobe.  Pt reported sob at beginning/              During home visit - sob but at end of visit gone. O2 sat at rest 98%.   Atrial fib:  Heart slightly irregular.  HR at rest 90, after ambulation went up to 111,               But shortly after resting went down to 92.  Teach back method used with               Pt to call MD if heart starts racing again, chest pain or increased sob.    Medications: review of pt's medications showed pt did not have Eliquis.  Per pt,                    Has been on Eliquis for years, to follow up with pharmacy about refill/take                as ordered.   Plan:  As discussed with pt, coworker Richarda Osmond [facility] RN CM covering for this RN CM             To follow up again next week telephonically (part of ongoing transition of              Care.              Plan to send Dr. Kary Kos 02/23/18 initial home visit encounter.   [facility] CM Care Plan Problem One     Most Recent Value  Care Plan Problem One  Risk for readmission related to recent hospitalization for CHF, atrial fib   Role Documenting the Problem One  Care Management [place] for Problem One  Active  [facility] Long Term Goal   Pt would not readmit to the hospital within the next 31 days   [facility] Long Term Goal Start Date  02/16/18  Interventions for Problem One Long  Term Goal  Provided pt with [facility] HF Adults, reviewed. also discussed calling MD if recurrent  chest pain/sob, heart racing.   [facility] CM Short Term Goal #1   Pt would not have a weight gain in the next 30 days   [facility] CM Short Term Goal #1 Start Date  02/16/18  Interventions for Short Term Goal #1  discussed with pt recent weights- no weight gain, provided pt with [facility] calendar to record weights.   [facility] CM Short Term Goal #2   Pt would keep all MD appointments in the next 30 days   [facility] CM Short Term Goal #2 Start Date  02/16/18  Interventions for Short Term Goal #2  Discussed with pt upcoming PCP and Heart MD appointments      [name].   [place] [facility]  [phone]

## 2018-02-27 ENCOUNTER — Emergency Department: Payer: PPO

## 2018-02-27 ENCOUNTER — Emergency Department
Admission: EM | Admit: 2018-02-27 | Discharge: 2018-02-28 | Disposition: A | Discharge status: Home / Self Care | Payer: PPO | Attending: Emergency Medicine | Admitting: Emergency Medicine

## 2018-02-27 ENCOUNTER — Other Ambulatory Visit: Payer: Self-pay

## 2018-02-27 ENCOUNTER — Encounter: Payer: Self-pay | Admitting: Emergency Medicine

## 2018-02-27 DIAGNOSIS — I4891 Unspecified atrial fibrillation: Secondary | ICD-10-CM

## 2018-02-27 DIAGNOSIS — I480 Paroxysmal atrial fibrillation: Secondary | ICD-10-CM | POA: Diagnosis not present

## 2018-02-27 DIAGNOSIS — Z85828 Personal history of other malignant neoplasm of skin: Secondary | ICD-10-CM | POA: Diagnosis not present

## 2018-02-27 DIAGNOSIS — Z7901 Long term (current) use of anticoagulants: Secondary | ICD-10-CM | POA: Diagnosis not present

## 2018-02-27 DIAGNOSIS — F419 Anxiety disorder, unspecified: Secondary | ICD-10-CM | POA: Diagnosis not present

## 2018-02-27 DIAGNOSIS — R55 Syncope and collapse: Secondary | ICD-10-CM | POA: Diagnosis not present

## 2018-02-27 DIAGNOSIS — Z7984 Long term (current) use of oral hypoglycemic drugs: Secondary | ICD-10-CM | POA: Insufficient documentation

## 2018-02-27 DIAGNOSIS — E119 Type 2 diabetes mellitus without complications: Secondary | ICD-10-CM | POA: Diagnosis not present

## 2018-02-27 DIAGNOSIS — Z79899 Other long term (current) drug therapy: Secondary | ICD-10-CM | POA: Diagnosis not present

## 2018-02-27 DIAGNOSIS — W19XXXA Unspecified fall, initial encounter: Secondary | ICD-10-CM

## 2018-02-27 DIAGNOSIS — J449 Chronic obstructive pulmonary disease, unspecified: Secondary | ICD-10-CM | POA: Insufficient documentation

## 2018-02-27 DIAGNOSIS — Z7982 Long term (current) use of aspirin: Secondary | ICD-10-CM | POA: Diagnosis not present

## 2018-02-27 DIAGNOSIS — I110 Hypertensive heart disease with heart failure: Secondary | ICD-10-CM | POA: Diagnosis not present

## 2018-02-27 DIAGNOSIS — S0990XA Unspecified injury of head, initial encounter: Secondary | ICD-10-CM | POA: Diagnosis not present

## 2018-02-27 DIAGNOSIS — R0602 Shortness of breath: Secondary | ICD-10-CM | POA: Diagnosis not present

## 2018-02-27 DIAGNOSIS — W010XXA Fall on same level from slipping, tripping and stumbling without subsequent striking against object, initial encounter: Secondary | ICD-10-CM | POA: Diagnosis not present

## 2018-02-27 DIAGNOSIS — Z87891 Personal history of nicotine dependence: Secondary | ICD-10-CM | POA: Insufficient documentation

## 2018-02-27 DIAGNOSIS — S199XXA Unspecified injury of neck, initial encounter: Secondary | ICD-10-CM | POA: Diagnosis not present

## 2018-02-27 DIAGNOSIS — I509 Heart failure, unspecified: Secondary | ICD-10-CM | POA: Diagnosis not present

## 2018-02-27 LAB — CBC WITH DIFFERENTIAL/PLATELET
Basophils Absolute: 0.1 10*3/uL (ref 0–0.1)
Basophils Relative: 1 %
EOS PCT: 2 %
Eosinophils Absolute: 0.2 10*3/uL (ref 0–0.7)
HCT: 32 % — ABNORMAL LOW (ref 40.0–52.0)
Hemoglobin: 10.2 g/dL — ABNORMAL LOW (ref 13.0–18.0)
LYMPHS ABS: 1.2 10*3/uL (ref 1.0–3.6)
LYMPHS PCT: 13 %
MCH: 22.8 pg — ABNORMAL LOW (ref 26.0–34.0)
MCHC: 31.8 g/dL — ABNORMAL LOW (ref 32.0–36.0)
MCV: 71.9 fL — AB (ref 80.0–100.0)
MONO ABS: 1.3 10*3/uL — AB (ref 0.2–1.0)
Monocytes Relative: 14 %
Neutro Abs: 6.7 10*3/uL — ABNORMAL HIGH (ref 1.4–6.5)
Neutrophils Relative %: 70 %
PLATELETS: 225 10*3/uL (ref 150–440)
RBC: 4.45 MIL/uL (ref 4.40–5.90)
RDW: 17.3 % — AB (ref 11.5–14.5)
WBC: 9.4 10*3/uL (ref 3.8–10.6)

## 2018-02-27 LAB — BASIC METABOLIC PANEL
Anion gap: 8 (ref 5–15)
BUN: 21 mg/dL — ABNORMAL HIGH (ref 6–20)
CALCIUM: 8.8 mg/dL — AB (ref 8.9–10.3)
CO2: 27 mmol/L (ref 22–32)
Chloride: 101 mmol/L (ref 101–111)
Creatinine, Ser: 1.45 mg/dL — ABNORMAL HIGH (ref 0.61–1.24)
GFR calc Af Amer: 48 mL/min — ABNORMAL LOW (ref >60)
GFR, EST NON AFRICAN AMERICAN: 42 mL/min — AB (ref >60)
GLUCOSE: 154 mg/dL — AB (ref 65–99)
POTASSIUM: 5.1 mmol/L (ref 3.5–5.1)
Sodium: 136 mmol/L (ref 135–145)

## 2018-02-27 LAB — BRAIN NATRIURETIC PEPTIDE: B Natriuretic Peptide: 315 pg/mL — ABNORMAL HIGH (ref 0.0–100.0)

## 2018-02-27 LAB — TROPONIN I: Troponin I: <0.03 ng/mL (ref <0.03)

## 2018-02-27 MED ORDER — MAGNESIUM SULFATE 2 GM/50ML IV SOLN
2.0000 g | Freq: Once | INTRAVENOUS | Status: AC
  Administered 2018-02-28: 2 g via INTRAVENOUS
  Filled 2018-02-27: qty 50

## 2018-02-27 MED ORDER — METOPROLOL TARTRATE 5 MG/5ML IV SOLN
5.0000 mg | Freq: Once | INTRAVENOUS | Status: AC
  Administered 2018-02-27: 5 mg via INTRAVENOUS
  Filled 2018-02-27: qty 5

## 2018-02-27 MED ORDER — METOPROLOL SUCCINATE ER 50 MG PO TB24
50.0000 mg | ORAL_TABLET | Freq: Once | ORAL | Status: AC
  Administered 2018-02-27: 50 mg via ORAL
  Filled 2018-02-27: qty 1

## 2018-02-27 MED ORDER — FUROSEMIDE 10 MG/ML IJ SOLN
20.0000 mg | Freq: Once | INTRAMUSCULAR | Status: AC
  Administered 2018-02-27: 20 mg via INTRAVENOUS
  Filled 2018-02-27: qty 4

## 2018-02-27 NOTE — ED Provider Notes (Signed)
[facility] Emergency Department Provider Note  ____________________________________________  Time seen: Approximately 8:44 PM  I have reviewed the triage vital signs and the nursing notes.   HISTORY  Chief Complaint Loss of Consciousness   HPI Gary Chang is a 84 y.o. male history of COPD, diabetes, recently diagnosed HFpEF, HTN, afib on Eliquis who presents via EMS for syncopal episode. Patient reports that he went in the laundry where they keep a commode and a scale. After using the commode he weighed himself. He had weighed himself this morning. He noted a 4 pound weight gain today. Patient weighed 215lbs on dc home and reports 222lbs today. After stepping out of the commode he had a sudden syncopal event. Patient reports that everything happened so fast that he doesn't remember if he felt dizzy or not prior to this event. He denies any prior history of syncope. He denies headache but does report mild neck pain. He tells me that this pain is chronic. Patient also endorses shortness of breath however this is his baseline. No chest pain, no fever, no chills, no vomiting, no diarrhea, no dysuria, no abdominal pain.  Past Medical History:  Diagnosis Date  . Abdominal aortic aneurysm (AAA) ([place])   . Allergic state   . Anxiety   . Arthritis   . Cancer ([place])    skin ca removed  . COPD (chronic obstructive pulmonary disease) ([place])   . Depression   . Diabetes mellitus    tingling in feet  . Dyspnea    with exertion  . Enlarged prostate    laser surgery  . GERD (gastroesophageal reflux disease)   . H/O hiatal hernia   . High cholesterol   . Hypertension    pcp  dr Jeneen Rinks [place]  [place]  . Kidney stones   . Melanoma ([place])   . Pneumonia   . Renal artery aneurysm [facility])     Patient Active Problem List   Diagnosis Date Noted  . Acute CHF (congestive heart failure) ([place]) 02/10/2018  . Abdominal pain 01/30/2017  . Ventral hernia without  obstruction or gangrene 01/30/2017  . Tension headache 04/30/2016  . Atypical facial pain 04/30/2016  . Left-sided weakness   . Occlusion and stenosis of basilar artery   . Vertebral artery stenosis   . Basilar artery stenosis 02/28/2016  . Dizziness and giddiness 11/29/2013  . Abnormality of gait 11/29/2013  . Diabetic neuropathy ([place]) 11/29/2013  . Memory loss 11/29/2013  . Headache, paroxysmal hemicrania, episodic 11/29/2013    Past Surgical History:  Procedure Laterality Date  . ABDOMINAL AORTIC ANEURYSM REPAIR    . BACK SURGERY     lumbar  ,cervical surgeries  . CATARACT EXTRACTION W/PHACO Left 12/11/2016   Procedure: CATARACT EXTRACTION PHACO AND INTRAOCULAR LENS PLACEMENT (IOC);  Surgeon: Leandrew Koyanagi, MD;  Location: [facility] ORS;  Service: Ophthalmology;  Laterality: Left;  [place] 1:17 AP% 17.7 CDE 13.8 FLUID PACK LOT # [id number]  . CATARACT EXTRACTION W/PHACO Right 08/26/2017   Procedure: CATARACT EXTRACTION PHACO AND INTRAOCULAR LENS PLACEMENT ([place]) RIGHT;  Surgeon: Leandrew Koyanagi, MD;  Location: [place];  Service: Ophthalmology;  Laterality: Right;  IVA TOPICAL DIABETES - oral meds  . ESOPHAGOGASTRODUODENOSCOPY (EGD) WITH PROPOFOL N/A 02/11/2016   Procedure: ESOPHAGOGASTRODUODENOSCOPY (EGD) WITH PROPOFOL;  Surgeon: Hulen Luster, MD;  [facility] ENDOSCOPY;  Service: Gastroenterology;  Laterality: N/A;  . EYE SURGERY     right cataract  . GREEN LIGHT LASER TURP (TRANSURETHRAL RESECTION OF PROSTATE N/A  07/14/2017   Procedure: GREEN LIGHT LASER TURP (TRANSURETHRAL RESECTION OF PROSTATE;  Surgeon: Royston Cowper, MD;  Location: [facility] ORS;  Service: Urology;  Laterality: N/A;  . HERNIA REPAIR     inguinal  . KIDNEY STONE SURGERY    . LUMBAR LAMINECTOMY/DECOMPRESSION MICRODISCECTOMY  12/10/2011   Procedure: LUMBAR LAMINECTOMY/DECOMPRESSION MICRODISCECTOMY;  Surgeon: Ophelia Charter;  Location: [facility] NEURO ORS;  Service: Neurosurgery;;  Thoracic One-Two  Laminectomy    Prior to Admission medications   Medication Sig Start Date End Date Taking? Authorizing Provider  apixaban (ELIQUIS) 5 MG TABS tablet Take 1 tablet by mouth 2 (two) times daily.   Yes [provider]  aspirin EC 81 MG tablet Take 81 mg by mouth at bedtime.    Yes [provider]  b complex vitamins tablet Take 1 tablet by mouth daily with lunch.    Yes [provider]  Cholecalciferol (VITAMIN D3) 1000 units CAPS Take 1,000 Units by mouth daily with lunch.    Yes [provider]  dicyclomine (BENTYL) 10 MG capsule Take 10 mg by mouth 4 (four) times daily as needed for spasms.    Yes [provider]  docusate sodium (COLACE) 100 MG capsule Take 2 capsules (200 mg total) by mouth 2 (two) times daily. 07/14/17  Yes Royston Cowper, MD  donepezil (ARICEPT) 5 MG tablet Take 5 mg by mouth every morning. 04/16/16  Yes [provider]  furosemide (LASIX) 40 MG tablet Take 0.5 tablets (20 mg total) by mouth daily. 02/12/18  Yes Demetrios Loll, MD  glipiZIDE (GLUCOTROL XL) 2.5 MG 24 hr tablet Take 2.5 mg by mouth at bedtime.     Yes [provider]  GLUCOSAMINE-CHONDROITIN PO Take 1 tablet by mouth 2 (two) times daily.    Yes [provider]  hydrALAZINE (APRESOLINE) 100 MG tablet Take 100 mg by mouth 3 (three) times daily.  11/02/13  Yes [provider]  hydrochlorothiazide (HYDRODIURIL) 25 MG tablet Take 25 mg by mouth daily.   Yes [provider]  HYDROcodone-acetaminophen (NORCO/VICODIN) 5-325 MG per tablet Take 1 tablet by mouth 2 (two) times daily as needed for moderate pain.  10/25/13  Yes [provider]  losartan (COZAAR) 100 MG tablet Take 100 mg by mouth every evening.    Yes [provider]  Magnesium Oxide 250 MG TABS Take 250 mg by mouth every evening.   Yes [provider]  Meth-Hyo-M Bl-Na Phos-Ph Sal (URO-MP) 118 MG CAPS Take 1 capsule by mouth 2 (two) times daily.    Yes [provider]  metoprolol succinate (TOPROL-XL) 50 MG 24 hr tablet Take 50 mg by mouth daily.    Yes [provider]  potassium chloride (K-DUR,KLOR-CON) 10 MEQ tablet Take 20 mEq by mouth 2 (two) times daily.   Yes [provider]  sertraline (ZOLOFT) 100 MG tablet Take 100 mg by mouth every evening.    Yes [provider]    Allergies Ezetimibe and Statins  Family History  Problem Relation Age of Onset  . Heart Problems Mother   . Tuberculosis Father   . Anesthesia problems Neg Hx   . Hypotension Neg Hx   . Malignant hyperthermia Neg Hx   . Pseudochol deficiency Neg Hx     Social History Social History   Tobacco Use  . Smoking status: Former Smoker    Types: Pipe    Last attempt to quit: 07/10/1987    Years since quitting: 30.6  .  Smokeless tobacco: Never Used  Substance Use Topics  . Alcohol use: Yes    Comment: occ 1-2 beers a month  . Drug use: No    Review of Systems  Constitutional: Negative for fever. + syncope Eyes: Negative for visual changes. ENT: Negative for sore throat. Neck: No neck pain  Cardiovascular: Negative for chest pain. Respiratory: + shortness of breath. Gastrointestinal: Negative for abdominal pain, vomiting or diarrhea. Genitourinary: Negative for dysuria. Musculoskeletal: Negative for back pain. Skin: Negative for rash. Neurological: Negative for headaches, weakness or numbness. Psych: No SI or HI  ____________________________________________   PHYSICAL EXAM:  VITAL SIGNS: ED Triage Vitals  Enc Vitals Group     BP -- 157/68     Pulse Rate 02/27/18 2038 99     Resp 02/27/18 2038 16     Temp --      Temp src --      SpO2 -- 100% RA     Weight 02/27/18 2039 224 lb (101.6 kg)     Height 02/27/18 2039 5\' 8"  (1.727 m)     Head Circumference --      Peak Flow --      Pain Score 02/27/18 2038 0     Pain Loc --      Pain Edu? --      Excl. in [place]? --     Constitutional: Alert and  oriented. Well appearing and in no apparent distress. HEENT:      Head: Normocephalic and atraumatic.         Eyes: Conjunctivae are normal. Sclera is non-icteric.       Mouth/Throat: Mucous membranes are moist.       Neck: Supple with no signs of meningismus. No midline ttp Cardiovascular: Regular rate and rhythm. No murmurs, gallops, or rubs. 2+ symmetrical distal pulses are present in all extremities. Elevated JVD to angle of the mandible Respiratory: Normal respiratory effort, normal sats. Lungs are clear to auscultation bilaterally with faint expiratory wheezing.  Gastrointestinal: Soft, non tender, and non distended with positive bowel sounds. No rebound or guarding. Musculoskeletal: Trace pitting edema bilateral lower extremities  Neurologic: Normal speech and language. Face is symmetric. Moving all extremities. No gross focal neurologic deficits are appreciated. Skin: Skin is warm, dry and intact. No rash noted. Psychiatric: Mood and affect are normal. Speech and behavior are normal.  ____________________________________________   LABS (all labs ordered are listed, but only abnormal results are displayed)  Labs Reviewed  CBC WITH DIFFERENTIAL/PLATELET - Abnormal; Notable for the following components:      Result Value   Hemoglobin 10.2 (*)    HCT 32.0 (*)    MCV 71.9 (*)    MCH 22.8 (*)    MCHC 31.8 (*)    RDW 17.3 (*)    Neutro Abs 6.7 (*)    Monocytes Absolute 1.3 (*)    All other components within normal limits  BASIC METABOLIC PANEL - Abnormal; Notable for the following components:   Glucose, Bld 154 (*)    BUN 21 (*)    Creatinine, Ser 1.45 (*)    Calcium 8.8 (*)    GFR calc non Af Amer 42 (*)    GFR calc Af Amer 48 (*)    All other components within normal limits  BRAIN NATRIURETIC PEPTIDE - Abnormal; Notable for the following components:   B Natriuretic Peptide 315.0 (*)    All other components within normal limits  TROPONIN I    ____________________________________________  EKG  ED ECG REPORT I, Rudene Re, the attending physician, personally viewed and interpreted this ECG.  Atrial fibrillation, rate of 104, normal QTC and QRS, normal axis, no ST elevations or depressions. No significant changes when compared to prior from last month ____________________________________________  RADIOLOGY  I have personally reviewed the images performed during this visit and I agree with the Radiologist's read.   Interpretation by Radiologist:  Dg Chest 2 View  Result Date: 02/27/2018 CLINICAL DATA:  Shortness of Breath EXAM: CHEST - 2 VIEW COMPARISON:  02/10/2018 FINDINGS: Cardiac shadow is stable. Aortic calcifications are again seen. Lungs are well aerated bilaterally. Small pleural effusions are seen stable from the prior exam. No focal infiltrate is noted. Degenerative changes of the thoracic spine are noted. Postsurgical changes are noted in the cervical spine. IMPRESSION: Stable small effusions posteriorly. No new focal abnormality is noted. Electronically Signed   By: Inez Catalina M.D.   On: 02/27/2018 20:16   Ct Head Wo Contrast  Result Date: 02/27/2018 CLINICAL DATA:  Recent syncopal episode with head injury and neck pain, initial encounter EXAM: CT HEAD WITHOUT CONTRAST CT CERVICAL SPINE WITHOUT CONTRAST TECHNIQUE: Multidetector CT imaging of the head and cervical spine was performed following the standard protocol without intravenous contrast. Multiplanar CT image reconstructions of the cervical spine were also generated. COMPARISON:  03/06/2016 FINDINGS: CT HEAD FINDINGS Brain: Mild atrophic changes are identified. No findings to suggest acute hemorrhage, acute infarction or space-occupying mass lesion are noted. Vascular: No hyperdense vessel or unexpected calcification. Skull: Normal. Negative for fracture or focal lesion. Sinuses/Orbits: No acute finding. Other: None. CT CERVICAL SPINE FINDINGS Alignment: Mild  loss of the normal cervical lordosis is noted. Skull base and vertebrae: 7 cervical segments are well visualized. Changes of prior fusion from C4-C7 are seen with anterior fixation. Diffuse facet hypertrophic changes are identified. No acute fracture or acute facet abnormality is noted. Soft tissues and spinal canal: No acute soft tissue abnormality is noted in the cervical spine. Upper chest: Moderate size right pleural effusion is noted greater than that expected based on recent chest x-ray. Other: None IMPRESSION: CT of the head: Mild atrophic changes without acute abnormality. CT of the cervical spine: Postsurgical and degenerative changes as described. Right-sided pleural effusion greater than that expected based on recent chest x-ray. Electronically Signed   By: Inez Catalina M.D.   On: 02/27/2018 20:33   Ct Cervical Spine Wo Contrast  Result Date: 02/27/2018 CLINICAL DATA:  Recent syncopal episode with head injury and neck pain, initial encounter EXAM: CT HEAD WITHOUT CONTRAST CT CERVICAL SPINE WITHOUT CONTRAST TECHNIQUE: Multidetector CT imaging of the head and cervical spine was performed following the standard protocol without intravenous contrast. Multiplanar CT image reconstructions of the cervical spine were also generated. COMPARISON:  03/06/2016 FINDINGS: CT HEAD FINDINGS Brain: Mild atrophic changes are identified. No findings to suggest acute hemorrhage, acute infarction or space-occupying mass lesion are noted. Vascular: No hyperdense vessel or unexpected calcification. Skull: Normal. Negative for fracture or focal lesion. Sinuses/Orbits: No acute finding. Other: None. CT CERVICAL SPINE FINDINGS Alignment: Mild loss of the normal cervical lordosis is noted. Skull base and vertebrae: 7 cervical segments are well visualized. Changes of prior fusion from C4-C7 are seen with anterior fixation. Diffuse facet hypertrophic changes are identified. No acute fracture or acute facet abnormality is noted.  Soft tissues and spinal canal: No acute soft tissue abnormality is noted in the cervical spine. Upper chest: Moderate size right pleural effusion is noted greater than  that expected based on recent chest x-ray. Other: None IMPRESSION: CT of the head: Mild atrophic changes without acute abnormality. CT of the cervical spine: Postsurgical and degenerative changes as described. Right-sided pleural effusion greater than that expected based on recent chest x-ray. Electronically Signed   By: Inez Catalina M.D.   On: 02/27/2018 20:33      ____________________________________________   PROCEDURES  Procedure(s) performed: None Procedures Critical Care performed:  None ____________________________________________   INITIAL IMPRESSION / ASSESSMENT AND PLAN / ED COURSE  84 y.o. male history of COPD, diabetes, recently diagnosed HFpEF, HTN, afib on Eliquis who presents via EMS for syncopal episode. EKG showing A. fib with well-controlled rate with no evidence of ischemia. Patient with no obvious signs of injury from his syncopal event based on history and physical exam however since patient is on Eliquis I will do a CT head and cervical spine. Patient has trace pitting edema and elevated JVD, faint expiratory wheezes concerning for a mild CHF exacerbation. No oxygen requirement. Labs pending. Low suspicion for PE with patient on Eliquis and no CP.  _________________________ 11:41 PM on 02/27/2018 -----------------------------------------  Family is now at bedside and tells me the patient has not been taking his Lasix since being discharged from the hospital. Also tell me the patient did not have a syncopal episode but fell off the scale at home. Patient is slightly confused which per wife and daughter it is his baseline. Patient is refusing to be admitted to the hospital at this time. He has received a dose of by mouth metoprolol and IV and a dose of IV Lasix. HR has improved to mid 90s- low 100s. Patient  has already made 500cc UOP. I discussed with the patient that I recommend admission for diureses and better rate control however patient is adamant about going home. His daughter and wife are comfortable with him going home and also wished for him to go home at this time. Patient has appointment with Cardiologist in 2 days. Discussed close monitoring of HR and weight at home. I discussed with him the importance of taking his medications including Lasix. I also recommended that he follows up with his primary care doctor or his cardiologist on Monday or return to the emergency room if he has worsening shortness of breath or if he has any passing out episode. Labs show a slightly elevated BNP at 315, creatinine is slightly at 1.45 ( baseline 1.2-1.3).     As part of my medical decision making, I reviewed the following data within the [place] History obtained from family, Nursing notes reviewed and incorporated, Labs reviewed , EKG interpreted , Old EKG reviewed, Radiograph reviewed , Notes from prior ED visits and [place] Controlled Substance Database    Pertinent labs & imaging results that were available during my care of the patient were reviewed by me and considered in my medical decision making (see chart for details).    ____________________________________________   FINAL CLINICAL IMPRESSION(S) / ED DIAGNOSES  Final diagnoses:  Acute on chronic congestive heart failure, unspecified heart failure type ([place])  Atrial fibrillation with RVR ([place])  Fall, initial encounter      NEW MEDICATIONS STARTED DURING THIS VISIT:  ED Discharge Orders    None       Note:  This document was prepared using Dragon voice recognition software and may include unintentional dictation errors.    Alfred Levins, [place], MD 02/27/18 [phone]

## 2018-02-27 NOTE — ED Notes (Signed)
Pharm called for meds  

## 2018-02-27 NOTE — ED Triage Notes (Signed)
Pt states weighed himself this evening for his chf and had a syncopal episode. States this was a few hrs ago - states the sob is chronic.

## 2018-02-27 NOTE — ED Triage Notes (Signed)
Ems called out for sob - sp02 98 but pt states feels sob - in afib and pt unsure if he has hx of

## 2018-02-27 NOTE — ED Notes (Addendum)
Pt's family at bedside. Per family pt did not have a syncopal episode. He is a fall risk with multiple falls and today he fell stepping off the scale.

## 2018-03-02 DIAGNOSIS — I5033 Acute on chronic diastolic (congestive) heart failure: Secondary | ICD-10-CM | POA: Diagnosis not present

## 2018-03-02 DIAGNOSIS — E119 Type 2 diabetes mellitus without complications: Secondary | ICD-10-CM | POA: Diagnosis not present

## 2018-03-02 DIAGNOSIS — R0602 Shortness of breath: Secondary | ICD-10-CM | POA: Diagnosis not present

## 2018-03-02 DIAGNOSIS — I5032 Chronic diastolic (congestive) heart failure: Secondary | ICD-10-CM | POA: Diagnosis not present

## 2018-03-02 DIAGNOSIS — I481 Persistent atrial fibrillation: Secondary | ICD-10-CM | POA: Diagnosis not present

## 2018-03-02 DIAGNOSIS — I10 Essential (primary) hypertension: Secondary | ICD-10-CM | POA: Diagnosis not present

## 2018-03-03 DIAGNOSIS — N401 Enlarged prostate with lower urinary tract symptoms: Secondary | ICD-10-CM | POA: Diagnosis not present

## 2018-03-04 ENCOUNTER — Encounter: Payer: Self-pay | Admitting: Family

## 2018-03-04 ENCOUNTER — Ambulatory Visit: Payer: PPO | Attending: Family | Admitting: Family

## 2018-03-04 VITALS — BP 121/49 | HR 93 | Resp 18 | Ht 67.0 in | Wt 220.2 lb

## 2018-03-04 DIAGNOSIS — R269 Unspecified abnormalities of gait and mobility: Secondary | ICD-10-CM

## 2018-03-04 DIAGNOSIS — F419 Anxiety disorder, unspecified: Secondary | ICD-10-CM | POA: Insufficient documentation

## 2018-03-04 DIAGNOSIS — N189 Chronic kidney disease, unspecified: Secondary | ICD-10-CM | POA: Diagnosis not present

## 2018-03-04 DIAGNOSIS — I130 Hypertensive heart and chronic kidney disease with heart failure and stage 1 through stage 4 chronic kidney disease, or unspecified chronic kidney disease: Secondary | ICD-10-CM | POA: Diagnosis not present

## 2018-03-04 DIAGNOSIS — I480 Paroxysmal atrial fibrillation: Secondary | ICD-10-CM | POA: Insufficient documentation

## 2018-03-04 DIAGNOSIS — E7800 Pure hypercholesterolemia, unspecified: Secondary | ICD-10-CM | POA: Insufficient documentation

## 2018-03-04 DIAGNOSIS — Z79899 Other long term (current) drug therapy: Secondary | ICD-10-CM | POA: Diagnosis not present

## 2018-03-04 DIAGNOSIS — Z7984 Long term (current) use of oral hypoglycemic drugs: Secondary | ICD-10-CM | POA: Insufficient documentation

## 2018-03-04 DIAGNOSIS — Z7982 Long term (current) use of aspirin: Secondary | ICD-10-CM | POA: Insufficient documentation

## 2018-03-04 DIAGNOSIS — I714 Abdominal aortic aneurysm, without rupture: Secondary | ICD-10-CM | POA: Diagnosis not present

## 2018-03-04 DIAGNOSIS — R0602 Shortness of breath: Secondary | ICD-10-CM | POA: Diagnosis present

## 2018-03-04 DIAGNOSIS — K219 Gastro-esophageal reflux disease without esophagitis: Secondary | ICD-10-CM | POA: Insufficient documentation

## 2018-03-04 DIAGNOSIS — Z7901 Long term (current) use of anticoagulants: Secondary | ICD-10-CM | POA: Diagnosis not present

## 2018-03-04 DIAGNOSIS — Z888 Allergy status to other drugs, medicaments and biological substances status: Secondary | ICD-10-CM | POA: Insufficient documentation

## 2018-03-04 DIAGNOSIS — N183 Chronic kidney disease, stage 3 (moderate): Secondary | ICD-10-CM

## 2018-03-04 DIAGNOSIS — F329 Major depressive disorder, single episode, unspecified: Secondary | ICD-10-CM | POA: Insufficient documentation

## 2018-03-04 DIAGNOSIS — E119 Type 2 diabetes mellitus without complications: Secondary | ICD-10-CM | POA: Insufficient documentation

## 2018-03-04 DIAGNOSIS — E1122 Type 2 diabetes mellitus with diabetic chronic kidney disease: Secondary | ICD-10-CM

## 2018-03-04 DIAGNOSIS — I482 Chronic atrial fibrillation, unspecified: Secondary | ICD-10-CM

## 2018-03-04 DIAGNOSIS — Z87891 Personal history of nicotine dependence: Secondary | ICD-10-CM | POA: Diagnosis not present

## 2018-03-04 DIAGNOSIS — I4891 Unspecified atrial fibrillation: Secondary | ICD-10-CM | POA: Insufficient documentation

## 2018-03-04 DIAGNOSIS — J449 Chronic obstructive pulmonary disease, unspecified: Secondary | ICD-10-CM | POA: Insufficient documentation

## 2018-03-04 DIAGNOSIS — I5032 Chronic diastolic (congestive) heart failure: Secondary | ICD-10-CM | POA: Diagnosis not present

## 2018-03-04 LAB — GLUCOSE, CAPILLARY: Glucose-Capillary: 180 mg/dL — ABNORMAL HIGH (ref 65–99)

## 2018-03-04 NOTE — Progress Notes (Signed)
Patient ID: Gary Chang, male    DOB: Dec 04, 1928, 84 y.o.   MRN: [id number]  HPI  Mr Anstead is a 84 y/o male with a history of AAA, DM, hyperlipidemia, HTN, CKD, GERD, anxiety, COPD, depression, remote tobacco use and chronic heart failure.   Echo report from 02/10/18 reviewed and showed an EF of 60-65% along with mild AR/MR.   Was in the ED 02/27/18 due to HF exacerbation. Had not been taking diuretic at home. Was given IV lasix in the ED and admission was recommended but patient refused and was released home. Admitted 02/10/18 due to new onset HF along with atrial fibrillation. Cardiology consult obtained. Initially needed IV lasix and then transitioned to oral diuretics. Discharged after 2 days.   He presents today for his initial visit with a chief complaint of minimal shortness of breath upon moderate exertion. His family says that this has been present for several years. He has associated dizziness, gait instability, occasional chest pain and difficulty sleeping. He denies cough, palpitations, abdominal distention or weight gain.   Past Medical History:  Diagnosis Date  . Abdominal aortic aneurysm (AAA) ([place])   . Allergic state   . Anxiety   . Arthritis   . Cancer ([place])    skin ca removed  . CHF (congestive heart failure) ([place])   . COPD (chronic obstructive pulmonary disease) ([place])   . Depression   . Diabetes mellitus    tingling in feet  . Dyspnea    with exertion  . Enlarged prostate    laser surgery  . GERD (gastroesophageal reflux disease)   . H/O hiatal hernia   . High cholesterol   . Hypertension    pcp  dr Jeneen Rinks [place]  [place]  . Kidney stones   . Melanoma ([place])   . Pneumonia   . Renal artery aneurysm [facility])    Past Surgical History:  Procedure Laterality Date  . ABDOMINAL AORTIC ANEURYSM REPAIR    . BACK SURGERY     lumbar  ,cervical surgeries  . CATARACT EXTRACTION W/PHACO Left 12/11/2016   Procedure: CATARACT EXTRACTION PHACO AND INTRAOCULAR  LENS PLACEMENT (IOC);  Surgeon: Leandrew Koyanagi, MD;  Location: [facility] ORS;  Service: Ophthalmology;  Laterality: Left;  [place] 1:17 AP% 17.7 CDE 13.8 FLUID PACK LOT # [id number]  . CATARACT EXTRACTION W/PHACO Right 08/26/2017   Procedure: CATARACT EXTRACTION PHACO AND INTRAOCULAR LENS PLACEMENT ([place]) RIGHT;  Surgeon: Leandrew Koyanagi, MD;  Location: [place];  Service: Ophthalmology;  Laterality: Right;  IVA TOPICAL DIABETES - oral meds  . ESOPHAGOGASTRODUODENOSCOPY (EGD) WITH PROPOFOL N/A 02/11/2016   Procedure: ESOPHAGOGASTRODUODENOSCOPY (EGD) WITH PROPOFOL;  Surgeon: Hulen [place], MD;  Location: [facility] ENDOSCOPY;  Service: Gastroenterology;  Laterality: N/A;  . EYE SURGERY     right cataract  . GREEN LIGHT LASER TURP (TRANSURETHRAL RESECTION OF PROSTATE N/A 07/14/2017   Procedure: GREEN LIGHT LASER TURP (TRANSURETHRAL RESECTION OF PROSTATE;  Surgeon: Royston Cowper, MD;  Location: [facility] ORS;  Service: Urology;  Laterality: N/A;  . HERNIA REPAIR     inguinal  . KIDNEY STONE SURGERY    . LUMBAR LAMINECTOMY/DECOMPRESSION MICRODISCECTOMY  12/10/2011   Procedure: LUMBAR LAMINECTOMY/DECOMPRESSION MICRODISCECTOMY;  Surgeon: Ophelia Charter;  Location: [facility] NEURO [facility]S;  Service: Neurosurgery;;  Thoracic One-Two Laminectomy   Family History  Problem Relation Age of Onset  . Heart Problems Mother   . Tuberculosis Father   . Anesthesia problems Neg Hx   . Hypotension Neg Hx   .  Malignant hyperthermia Neg Hx   . Pseudochol deficiency Neg Hx    Social History   Tobacco Use  . Smoking status: Former Smoker    Types: Pipe    Last attempt to quit: 07/10/1987    Years since quitting: 30.6  . Smokeless tobacco: Never Used  Substance Use Topics  . Alcohol use: Yes    Comment: occ 1-2 beers a month   Allergies  Allergen Reactions  . Ezetimibe Other (See Comments)    "Numbness and tingling"  . Statins Other (See Comments)    "Numbness and tingling"   Prior to Admission medications    Medication Sig Start Date End Date Taking? Authorizing Provider  apixaban (ELIQUIS) 5 MG TABS tablet Take 1 tablet by mouth 2 (two) times daily.   Yes [provider]  aspirin EC 81 MG tablet Take 81 mg by mouth at bedtime.    Yes [provider]  b complex vitamins tablet Take 1 tablet by mouth daily with lunch.    Yes [provider]  Cholecalciferol (VITAMIN D3) 1000 units CAPS Take 1,000 Units by mouth daily with lunch.    Yes [provider]  dicyclomine (BENTYL) 10 MG capsule Take 10 mg by mouth 4 (four) times daily as needed for spasms.    Yes [provider]  docusate sodium (COLACE) 100 MG capsule Take 2 capsules (200 mg total) by mouth 2 (two) times daily. 07/14/17  Yes Royston Cowper, MD  donepezil (ARICEPT) 5 MG tablet Take 5 mg by mouth every morning. 04/16/16  Yes [provider]  glipiZIDE (GLUCOTROL XL) 2.5 MG 24 hr tablet Take 2.5 mg by mouth at bedtime.     Yes [provider]  GLUCOSAMINE-CHONDROITIN PO Take 1 tablet by mouth 2 (two) times daily.    Yes [provider]  hydrALAZINE (APRESOLINE) 100 MG tablet Take 100 mg by mouth 3 (three) times daily.  11/02/13  Yes [provider]  hydrochlorothiazide (HYDRODIURIL) 25 MG tablet Take 25 mg by mouth daily.   Yes [provider]  HYDROcodone-acetaminophen (NORCO/VICODIN) 5-325 MG per tablet Take 1 tablet by mouth 2 (two) times daily as needed for moderate pain.  10/25/13  Yes [provider]  losartan (COZAAR) 100 MG tablet Take 100 mg by mouth every evening.    Yes [provider]  Magnesium Oxide 250 MG TABS Take 250 mg by mouth every evening.   Yes [provider]  Meth-Hyo-M Bl-Na Phos-Ph Sal (URO-MP) 118 MG CAPS Take 1 capsule by mouth 2 (two) times daily.   Yes [provider]  metoprolol succinate (TOPROL-XL) 50 MG 24 hr tablet Take 50 mg by mouth daily.    Yes [provider]  potassium  chloride (K-DUR,KLOR-CON) 10 MEQ tablet Take 20 mEq by mouth 2 (two) times daily.   Yes [provider]  sertraline (ZOLOFT) 100 MG tablet Take 100 mg by mouth every evening.    Yes [provider]  torsemide (DEMADEX) 20 MG tablet Take 40 mg by mouth daily.  03/02/18  Yes [provider]    Review of Systems  Constitutional: Negative for appetite change and fatigue.  HENT: Negative for congestion, postnasal drip and sore throat.   Eyes: Negative.   Respiratory: Positive for shortness of breath (with minimal exertion). Negative for cough.   Cardiovascular: Positive for chest pain (on occasion). Negative for leg swelling.  Gastrointestinal: Negative for abdominal distention and abdominal pain.  Endocrine: Negative.  Genitourinary: Negative.   Musculoskeletal: Negative for back pain and neck pain.  Skin: Negative.   Allergic/Immunologic: Negative.   Neurological: Positive for dizziness. Negative for light-headedness.       Memory loss  Hematological: Negative for adenopathy. Does not bruise/bleed easily.  Psychiatric/Behavioral: Positive for sleep disturbance. Negative for dysphoric mood. The patient is not nervous/anxious.    Vitals:   03/04/18 1239  BP: (!) 121/49  Pulse: 93  Resp: 18  SpO2: 98%  Weight: 220 lb 4 oz (99.9 kg)  Height: 5\' 7"  (1.702 m)   Wt Readings from Last 3 Encounters:  03/04/18 220 lb 4 oz (99.9 kg)  02/27/18 224 lb (101.6 kg)  02/23/18 215 lb (97.5 kg)   Lab Results  Component Value Date   CREATININE 1.45 (H) 02/27/2018   CREATININE 1.34 (H) 02/12/2018   CREATININE 1.27 (H) 02/11/2018   Physical Exam  Constitutional: He appears well-developed and well-nourished.  HENT:  Head: Normocephalic and atraumatic.  Neck: Normal range of motion. Neck supple. No JVD present.  Cardiovascular: Normal rate. An irregular rhythm present.  Pulmonary/Chest: Effort normal. He has no wheezes. He has no rales.  Abdominal: Soft. He exhibits  no distension. There is no tenderness.  Musculoskeletal: He exhibits no edema or tenderness.  Neurological: He is alert.  Skin: Skin is warm and dry.  Psychiatric: He has a normal mood and affect. His behavior is normal. Thought content normal.  Nursing note and vitals reviewed.  Assessment & Plan:  1: Chronic heart failure with preserved ejection fraction- - NYHA class II - euvolemic today - weighing daily and he & his family were instructed to call for an overnight weight gain of >2 pounds or a weekly weight gain of >5 pounds - not adding salt. Reviewed the importance of closely following a 2000mg  sodium diet and written dietary information was given to them about this - had been taking torsemide 20mg  BID but he was advised that he could take the torsemide as 40mg  in the morning if that was better for him - saw cardiology Nehemiah Massed) 03/02/18 and diuretic was switched to torsemide at that time; returns next week  2: Atrial fibrillation- - currently rate controlled at this time - taking apixaban  3: Diabetes- - nonfasting glucose in clinic today was 180 - taking glipizide - saw PCP Kary Kos) 03/02/18  4: Gait abnormality- - had recent fall resulting in ED visit - family says that patient is dizzy "all the time" and is quite unsteady on his feet. - does use a walker in the home and tries to change positions slowly - had Effie so will contact [facility] to see if they can continue and get PT eval to assist with balance  Patient did not bring his medications nor a list. Each medication was verbally reviewed with the patient and he was encouraged to bring the bottles to every visit to confirm accuracy of list.  Return in 1 month or sooner for any questions/problems before then.

## 2018-03-04 NOTE — Patient Instructions (Signed)
Continue weighing daily and call for an overnight weight gain of > 2 pounds or a weekly weight gain of >5 pounds. 

## 2018-03-05 ENCOUNTER — Other Ambulatory Visit: Payer: Self-pay | Admitting: *Deleted

## 2018-03-05 ENCOUNTER — Encounter: Payer: Self-pay | Admitting: *Deleted

## 2018-03-05 NOTE — Patient Outreach (Addendum)
Gary Chang) Care Management [place] Telephone Outreach, Transition of Care day 19  03/05/2018  Gary Chang Jul 19, 1928 [id number]  Successful telephone outreach to Gary Chang, 85 y/o male referred to [facility] for transition of care after recent hospitalization February 20- 22, 2019 for Atrial fib, CHF (new onset). Pt's history includes but not limited to Diabetic neuropathy, basilar artery stenosis, memory loss, left sided weakness.  HIPAA/ identity verified with patient during phone call today.  Explained that I was contacting patient for primary [facility] RN CM Gary Chang.  Noted from review of EMR that patient had recent ED visit February 27, 2018 for fall after possible syncopal episode while he was weighing himself; ED provider recommended Chang admission due to patient's symptoms and reported weight gain; family had reported during ED visit that patient had not been adherent with taking his diuretic therapy at home.  Patient refused Chang admission.  Today, patient reports that he "is doing pretty good this morning;" and denies pain, shortness of breath, new falls since ED visit on 02/27/18, or any other concerns,  issues/ problems.  Patient sounds to be in no obvious/ apparent distress throughout phone call today.    Patient further reports:  -- has continued monitoring and recording daily weights; reports weight this morning of 213 lbs  -- patient is unable to remember if he attended provider appointments this week, or if he has obtained new diuretic post- provider office visit; during phone call, patient is asking his wife the questions I ask him; eventually, patient provides verbal permission for to speak with his wife.  -- wife confirms that patient did attend provider appointments this week for cardiology, PCP, and heart failure clinic; wife reports that she has obtained his "new" diuretic, and she provides accurate reporting of dosing for  toresemide; states she is providing medication to patient, and that he is taking as prescribed; adherence to medication regimen encouraged; patient's wife denies questions/ concerns around patient's medications, states that patient has and is taking all medications as prescribed, including Eliquis.  -- discussed with patient's wife heart failure zones, weight gain guidelines in setting of CHF, and she verbalizes good general understanding of both; wife denies that patient is in distress or having problems today.  Reviewed with patient's wife signs/ symptoms that would necessitate patient seeking urgent/ emergent care.  Wife states that she thinks patient "is fine today," but admits that she "thinks he is going downhill."  -- wife confirms that home health services remain active; states patient is expecting visit from home health nurse today.  Patient/ wife deny further issues, concerns, or problems today.  I confirmed that patient has direct phone number for primary [facility] RN CM, the main [facility] CM office phone number, and the [facility] CM 24-hour nurse advice phone number should issues arise prior to next scheduled [place] outreach.  Discussed with patient's wife that [place] would contact patient again next week for ongoing transition of care; patient's wife verbalized understanding and agreement with this plan.  Plan:  Will update patient's primary [facility] RN CM of today's successful telephone outreach to patient   [facility] CM Care Plan Problem One     Most Recent Value  Care Plan Problem One  Risk for readmission related to recent hospitalization for CHF, atrial fib   Role Documenting the Problem One  Care Management [place] for Problem One  Active  [facility] [place] Long Term Goal  Pt would not readmit to the Chang within the next 31 days   [facility] Long Term Goal Start Date  02/16/18  Interventions for Problem One Long Term Goal  Discussed with patient and his spouse recent ED visit and  subsequent office visits to cardiology provider, PCP, heart failure clinic,  confirmed that patient has obtained and is taking new diuretic as prescribed,  confirmed that patient is continuing to monitor/ record daily weights and reviewed weight gain guidelines in setting of CHF,  encouraged patient to remain adherent to medication regimen  [facility] CM Short Term Goal #1   Pt would not have a weight gain in the next 30 days   [facility] CM Short Term Goal #1 Start Date  02/16/18  Interventions for Short Term Goal #1  Discussed with patient current daily weights/ recent weight gain requiring ED visit,  encouraged patient to continue monitoring/ recording daily weights and reviewed weight gain guidelines with both patient and spouse  [facility] CM Short Term Goal #2   Pt would keep all MD appointments in the next 30 days   [facility] CM Short Term Goal #2 Start Date  02/16/18  Interventions for Short Term Goal #2  Reviewed recently attended provider office visits this week with patient's spouse     Oneta Rack, RN, BSN, [employer] Coordinator [facility] Care Management  [phone]

## 2018-03-08 ENCOUNTER — Other Ambulatory Visit: Payer: Self-pay

## 2018-03-08 DIAGNOSIS — R269 Unspecified abnormalities of gait and mobility: Secondary | ICD-10-CM

## 2018-03-08 DIAGNOSIS — Y92009 Unspecified place in unspecified non-institutional (private) residence as the place of occurrence of the external cause: Secondary | ICD-10-CM

## 2018-03-08 DIAGNOSIS — W19XXXA Unspecified fall, initial encounter: Secondary | ICD-10-CM

## 2018-03-08 IMAGING — CT CT ABD-PELV W/ CM
2 of 5 series · 15 of 46 positions shown, 17 images · IV contrast (APPLIED)
Comparison: 12/16/2013

CLINICAL DATA: RIGHT lower quadrant pain for 3 weeks, history
kidney stones, history diabetes mellitus, hypertension, skin cancer,
COPD, abdominal aortic aneurysm, melanoma

EXAM:
CT ABDOMEN AND PELVIS WITH CONTRAST
TECHNIQUE: Multidetector CT imaging of the abdomen and pelvis was performed
using the standard protocol following bolus administration of
intravenous contrast. Sagittal and coronal MPR images reconstructed
from axial data set.
CONTRAST:  80mL MVCPDX-W44 IOPAMIDOL (MVCPDX-W44) INJECTION 61% IV.
Dilute oral contrast.

[Series 2: routine abd/pel with · axial · 0.85mm/px · z∈[-154,+271]mm · 12 of 97 slices shown, 14 images]
[im 6/97  soft-tissue]
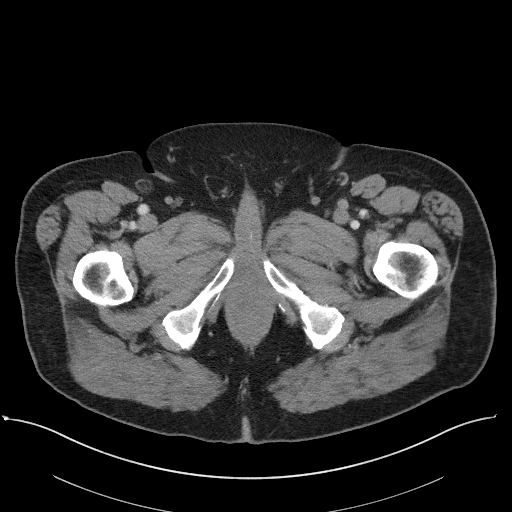
[im 6/97  bone]
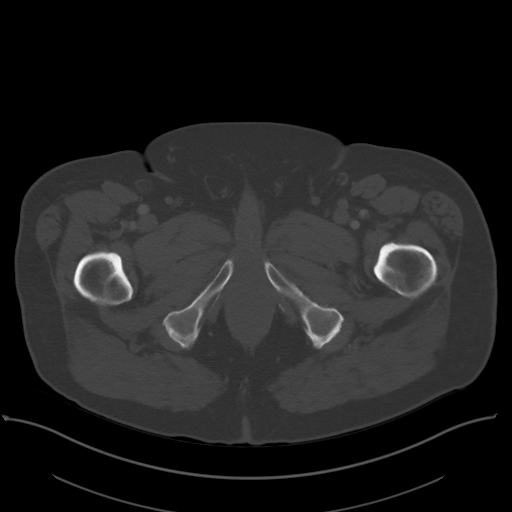
[im 16/97  soft-tissue]
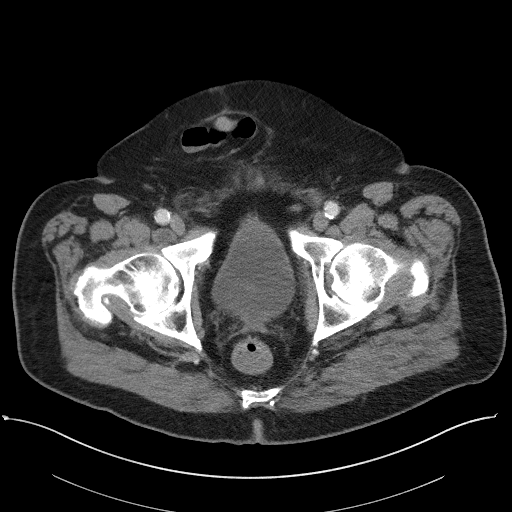
[im 21/97  soft-tissue]
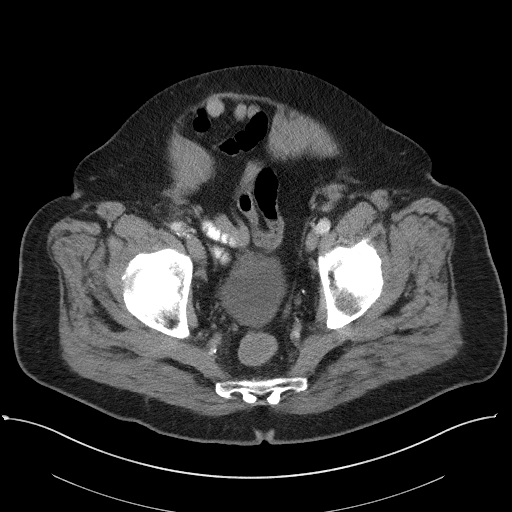
[im 31/97  soft-tissue]
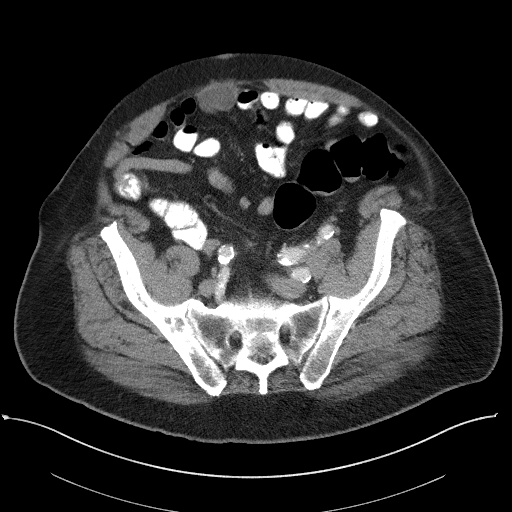
[im 36/97  soft-tissue]
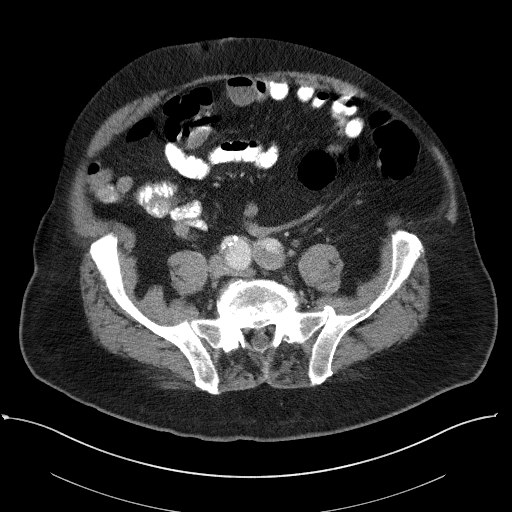
[im 46/97  soft-tissue]
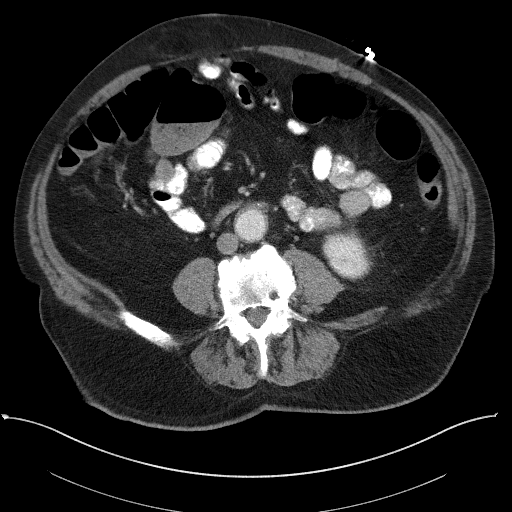
[im 51/97  soft-tissue]
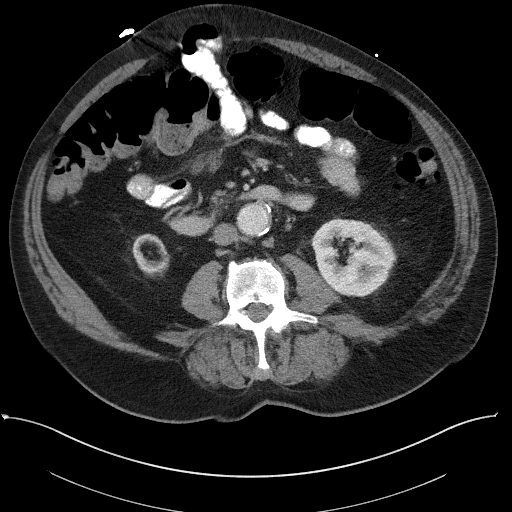
[im 61/97  soft-tissue]
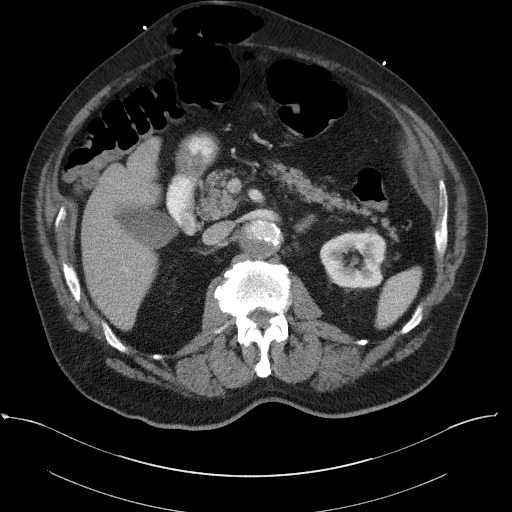
[im 66/97  soft-tissue]
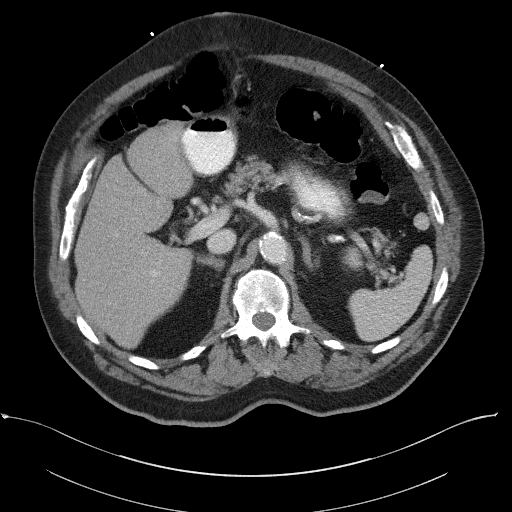
[im 66/97  bone]
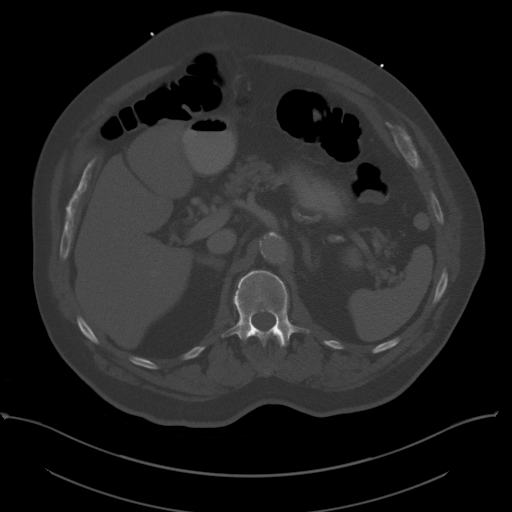
[im 76/97  soft-tissue]
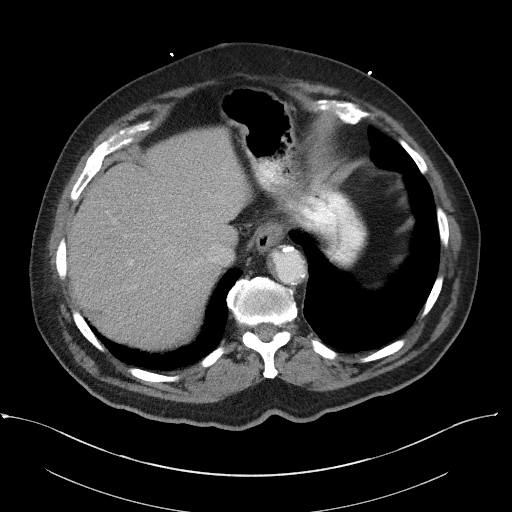
[im 81/97  soft-tissue]
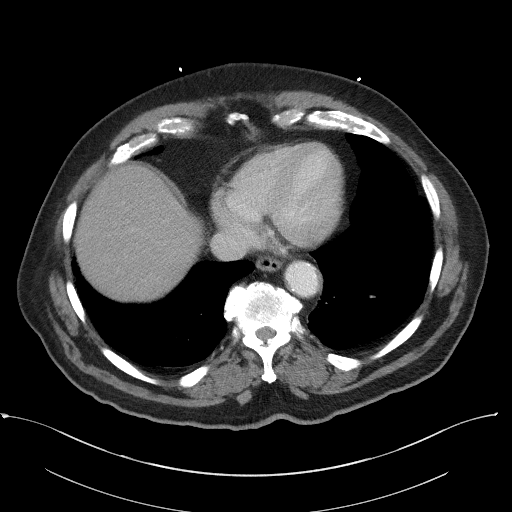
[im 91/97  soft-tissue]
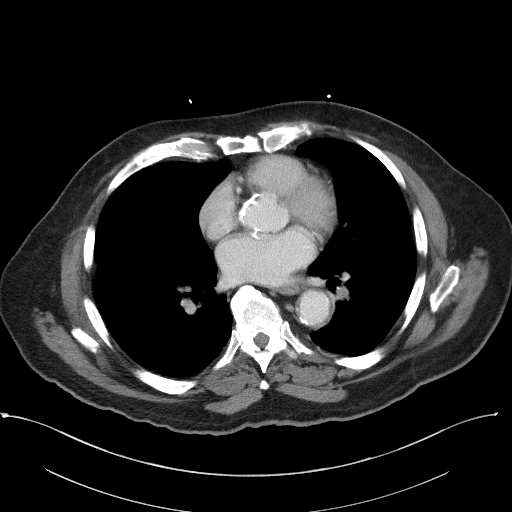

[Series 5: coronal st · coronal · 0.76mm/px · 3 of 122 slices shown]
[im 41/122  soft-tissue]
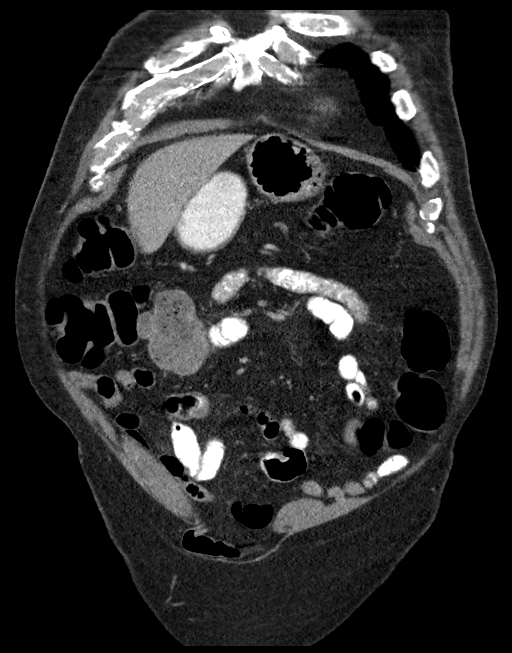
[im 54/122  soft-tissue]
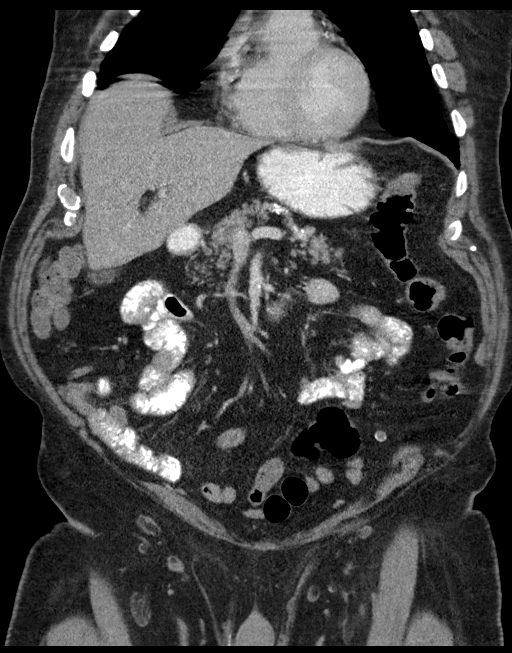
[im 68/122  soft-tissue]
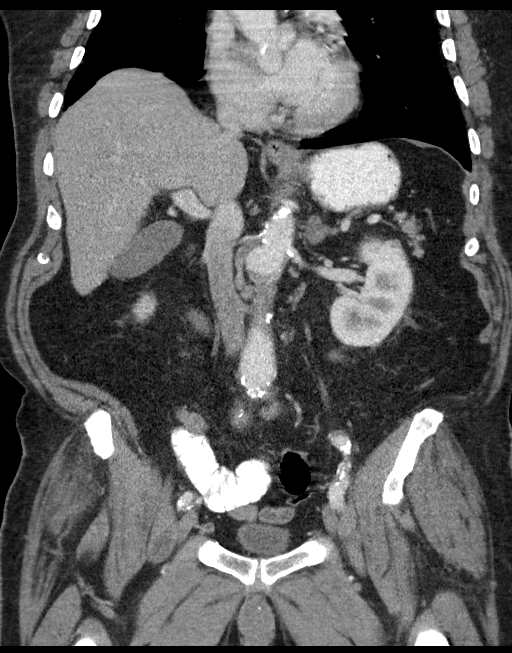

[15 of 46 positions shown; findings below may reference images not displayed]

FINDINGS: Lower chest: Lungs appear hyperinflated with minimal dependent
atelectasis at lung bases. Tiny calcified granuloma LEFT lower lobe
unchanged image 18.

Hepatobiliary: Gallbladder and liver normal appearance

Pancreas: Atrophic pancreas without definite mass

Spleen: Normal appearance.  Splenule anterior to spleen

Adrenals/Urinary Tract: Unremarkable adrenal glands. Atrophic RIGHT
kidney. LEFT kidney, ureters, and bladder normal appearance.
Throughout prostatic enlargement, gland measuring 5.5 x 4.6 x 4.3 cm
(volume = 57 cm3).

Stomach/Bowel: Normal appendix. Supraumbilical hernia containing a
nonobstructed segment of mid transverse colon. Small umbilical and
supraumbilical ventral hernias containing nonobstructed small bowel
loops. Stomach and bowel loops otherwise normal appearance

Vascular/Lymphatic: Atherosclerotic calcifications aorta and iliac
arteries. Prior abdominal aortic aneurysm repair. Aneurysmal
dilatation of the common iliac arteries, 26 mm diameter RIGHT and 27
mm LEFT.

Significant thrombus in LEFT common iliac artery.

Reproductive: N/A

Other: The large supraumbilical ventral hernia containing a segment
of mid transverse colon measures 8.7 x 4.4 cm. Multiple additional
smaller ventral hernias containing fat and nonobstructed small bowel
as above. No free intraperitoneal air or fluid. Tiny RIGHT inguinal
hernia containing fat.

Musculoskeletal: Bones demineralized with degenerative disc and
facet disease changes lumbar spine.
IMPRESSION: Multiple ventral hernias as above, largest supraumbilical a 0.7 x
4.4 cm in size containing nonobstructed mid transverse colon.

Nonobstructed small bowel loops within to additional hernias with
smaller ventral hernias containing fat.

Prostatic enlargement.

RIGHT renal atrophy.

Aortic atherosclerosis with postsurgical changes of abdominal aortic
aneurysm repair ; aneurysmal dilatation common iliac arteries is
seen bilaterally as above.

## 2018-03-09 ENCOUNTER — Other Ambulatory Visit: Payer: Self-pay | Admitting: *Deleted

## 2018-03-09 DIAGNOSIS — E782 Mixed hyperlipidemia: Secondary | ICD-10-CM | POA: Diagnosis not present

## 2018-03-09 DIAGNOSIS — I481 Persistent atrial fibrillation: Secondary | ICD-10-CM | POA: Diagnosis not present

## 2018-03-09 DIAGNOSIS — R0602 Shortness of breath: Secondary | ICD-10-CM | POA: Diagnosis not present

## 2018-03-09 DIAGNOSIS — R000 Tachycardia, unspecified: Secondary | ICD-10-CM | POA: Diagnosis not present

## 2018-03-09 DIAGNOSIS — E1140 Type 2 diabetes mellitus with diabetic neuropathy, unspecified: Secondary | ICD-10-CM | POA: Diagnosis not present

## 2018-03-09 DIAGNOSIS — I10 Essential (primary) hypertension: Secondary | ICD-10-CM | POA: Diagnosis not present

## 2018-03-09 DIAGNOSIS — I5033 Acute on chronic diastolic (congestive) heart failure: Secondary | ICD-10-CM | POA: Diagnosis not present

## 2018-03-09 NOTE — Patient Outreach (Signed)
03/09/18-  Transition of care call   Successful telephone encounter to Gary Chang, 84 year old male for transition of care,  Ongoing follow up on recent hospitalization February 20-22,2019 for Atrial fib, CHF (new onset). Pt's history includes but not limited to Diabetic neuropathy, basilar artery stenosis, memory loss, Left sided weakness.  Spoke with pt, HIPAA identifiers verified.  Pt reports saw Heart MD today,  Received a good report, current weight 204 lbs, fluctuates a pound, no edema, sob or chest Pain.   Pt reports taking all of his medications,managing himself, has weekly pill planner,  Spouse gives reminder.    Pt reports HH RN still coming.  Pt reports no recent falls, no dizziness.    Plan: As discussed with pt, plan to follow up again next week telephonically (final  Transition of care call).     Gary Chang.   [place] Care Management  [phone]

## 2018-03-10 DIAGNOSIS — E1140 Type 2 diabetes mellitus with diabetic neuropathy, unspecified: Secondary | ICD-10-CM | POA: Diagnosis not present

## 2018-03-10 DIAGNOSIS — I5031 Acute diastolic (congestive) heart failure: Secondary | ICD-10-CM | POA: Diagnosis not present

## 2018-03-10 DIAGNOSIS — I482 Chronic atrial fibrillation: Secondary | ICD-10-CM | POA: Diagnosis not present

## 2018-03-10 DIAGNOSIS — I110 Hypertensive heart disease with heart failure: Secondary | ICD-10-CM | POA: Diagnosis not present

## 2018-03-10 DIAGNOSIS — J439 Emphysema, unspecified: Secondary | ICD-10-CM | POA: Diagnosis not present

## 2018-03-17 DIAGNOSIS — I482 Chronic atrial fibrillation: Secondary | ICD-10-CM | POA: Diagnosis not present

## 2018-03-17 DIAGNOSIS — I110 Hypertensive heart disease with heart failure: Secondary | ICD-10-CM | POA: Diagnosis not present

## 2018-03-17 DIAGNOSIS — J439 Emphysema, unspecified: Secondary | ICD-10-CM | POA: Diagnosis not present

## 2018-03-17 DIAGNOSIS — E1140 Type 2 diabetes mellitus with diabetic neuropathy, unspecified: Secondary | ICD-10-CM | POA: Diagnosis not present

## 2018-03-17 DIAGNOSIS — I5031 Acute diastolic (congestive) heart failure: Secondary | ICD-10-CM | POA: Diagnosis not present

## 2018-03-19 ENCOUNTER — Other Ambulatory Visit: Payer: Self-pay | Admitting: *Deleted

## 2018-03-19 ENCOUNTER — Ambulatory Visit: Payer: Self-pay | Admitting: *Deleted

## 2018-03-19 NOTE — Patient Outreach (Signed)
03/19/2018  Transition of care call:  Unsuccessful telephone encounter to Gary Chang, 71 year of male for transition of care (final call), ongoing follow up on recent hospitalization February 20-22,2019 for Atrial fib, CHF (new onset).   HIPAA compliant voice message left with RN CM's contact information.  Plan: if no response, plan to follow up again next week telephonically.    [name].   [place] Care Management  [phone]

## 2018-03-22 ENCOUNTER — Ambulatory Visit: Payer: Self-pay | Admitting: *Deleted

## 2018-03-22 ENCOUNTER — Other Ambulatory Visit: Payer: Self-pay | Admitting: *Deleted

## 2018-03-22 NOTE — Patient Outreach (Signed)
Another unsuccessful telephone encounter to [name], 84 year old male today for final transition of care call/ongoing follow up on recent hospitalization February 20-22,2019 for Atrial fib, CHF (new onset).   HIPAA compliant voice message left with RN CM's contact information.  Plan:  RN CM to make a third call later this week.    [name].   [place] Care Management  [phone]

## 2018-03-22 NOTE — Patient Outreach (Signed)
03/22/2018   Gary Chang 07/10/2016  [id number]  Second unsuccessful telephone encounter to [name], 84 year old male for final transition of care call/ ongoing follow up on recent hospitalization February 20-22,2019 for Atrial fib, CHF (new onset).   HIPAA  Compliant voice messages  left on pt's home phone and spouse's (on consent form) mobile with  RN CM's contact information.   Plan:  If no response to voice messages left, to try later today telephonically.   [name].   [place] Care Management  [phone]

## 2018-03-24 ENCOUNTER — Other Ambulatory Visit: Payer: Self-pay | Admitting: *Deleted

## 2018-03-24 ENCOUNTER — Encounter: Payer: Self-pay | Admitting: *Deleted

## 2018-03-24 ENCOUNTER — Ambulatory Visit: Payer: Self-pay | Admitting: *Deleted

## 2018-03-24 DIAGNOSIS — E1140 Type 2 diabetes mellitus with diabetic neuropathy, unspecified: Secondary | ICD-10-CM | POA: Diagnosis not present

## 2018-03-24 DIAGNOSIS — I482 Chronic atrial fibrillation: Secondary | ICD-10-CM | POA: Diagnosis not present

## 2018-03-24 DIAGNOSIS — I110 Hypertensive heart disease with heart failure: Secondary | ICD-10-CM | POA: Diagnosis not present

## 2018-03-24 DIAGNOSIS — J439 Emphysema, unspecified: Secondary | ICD-10-CM | POA: Diagnosis not present

## 2018-03-24 DIAGNOSIS — I5031 Acute diastolic (congestive) heart failure: Secondary | ICD-10-CM | POA: Diagnosis not present

## 2018-03-24 NOTE — Patient Outreach (Addendum)
03/24/2018    Unsuccessful telephone encounter to Gary Chang, 84 year old male for final transition of care call as pt  Did leave a voice message yesterday responding to second attempt made by RN CM.   RN CM following  pt  For transition of care/recent hospitalization February 20-22,2019 for Atrial fib, CHF (new onset).  HIPAA  Compliant voice message left with RN CM's contact information with request to return call.   Plan: if no response to voice message left, plan to send unable to contact letter.           Transition of care program completed, care plan addressed.    Gary Chang.   [place] Care Management  [phone]

## 2018-03-25 DIAGNOSIS — I480 Paroxysmal atrial fibrillation: Secondary | ICD-10-CM | POA: Diagnosis not present

## 2018-03-28 ENCOUNTER — Inpatient Hospital Stay
Admission: EM | Admit: 2018-03-28 | Discharge: 2018-03-31 | DRG: 375 | Disposition: A | Discharge status: Home Health | Payer: PPO | Attending: Internal Medicine | Admitting: Internal Medicine

## 2018-03-28 ENCOUNTER — Other Ambulatory Visit: Payer: Self-pay

## 2018-03-28 ENCOUNTER — Encounter: Payer: Self-pay | Admitting: Emergency Medicine

## 2018-03-28 DIAGNOSIS — D62 Acute posthemorrhagic anemia: Secondary | ICD-10-CM | POA: Diagnosis present

## 2018-03-28 DIAGNOSIS — Z7982 Long term (current) use of aspirin: Secondary | ICD-10-CM | POA: Diagnosis not present

## 2018-03-28 DIAGNOSIS — N400 Enlarged prostate without lower urinary tract symptoms: Secondary | ICD-10-CM | POA: Diagnosis present

## 2018-03-28 DIAGNOSIS — C180 Malignant neoplasm of cecum: Principal | ICD-10-CM | POA: Diagnosis present

## 2018-03-28 DIAGNOSIS — Z7901 Long term (current) use of anticoagulants: Secondary | ICD-10-CM

## 2018-03-28 DIAGNOSIS — I129 Hypertensive chronic kidney disease with stage 1 through stage 4 chronic kidney disease, or unspecified chronic kidney disease: Secondary | ICD-10-CM | POA: Diagnosis present

## 2018-03-28 DIAGNOSIS — D509 Iron deficiency anemia, unspecified: Secondary | ICD-10-CM | POA: Diagnosis not present

## 2018-03-28 DIAGNOSIS — E1122 Type 2 diabetes mellitus with diabetic chronic kidney disease: Secondary | ICD-10-CM | POA: Diagnosis present

## 2018-03-28 DIAGNOSIS — R531 Weakness: Secondary | ICD-10-CM | POA: Diagnosis present

## 2018-03-28 DIAGNOSIS — N183 Chronic kidney disease, stage 3 unspecified: Secondary | ICD-10-CM | POA: Diagnosis present

## 2018-03-28 DIAGNOSIS — K259 Gastric ulcer, unspecified as acute or chronic, without hemorrhage or perforation: Secondary | ICD-10-CM | POA: Diagnosis not present

## 2018-03-28 DIAGNOSIS — K2950 Unspecified chronic gastritis without bleeding: Secondary | ICD-10-CM | POA: Diagnosis not present

## 2018-03-28 DIAGNOSIS — Z7984 Long term (current) use of oral hypoglycemic drugs: Secondary | ICD-10-CM

## 2018-03-28 DIAGNOSIS — E7800 Pure hypercholesterolemia, unspecified: Secondary | ICD-10-CM | POA: Diagnosis present

## 2018-03-28 DIAGNOSIS — E119 Type 2 diabetes mellitus without complications: Secondary | ICD-10-CM

## 2018-03-28 DIAGNOSIS — Z87891 Personal history of nicotine dependence: Secondary | ICD-10-CM | POA: Diagnosis not present

## 2018-03-28 DIAGNOSIS — J449 Chronic obstructive pulmonary disease, unspecified: Secondary | ICD-10-CM | POA: Diagnosis present

## 2018-03-28 DIAGNOSIS — K449 Diaphragmatic hernia without obstruction or gangrene: Secondary | ICD-10-CM | POA: Diagnosis not present

## 2018-03-28 DIAGNOSIS — Z66 Do not resuscitate: Secondary | ICD-10-CM | POA: Diagnosis present

## 2018-03-28 DIAGNOSIS — D124 Benign neoplasm of descending colon: Secondary | ICD-10-CM | POA: Diagnosis present

## 2018-03-28 DIAGNOSIS — Z79899 Other long term (current) drug therapy: Secondary | ICD-10-CM

## 2018-03-28 DIAGNOSIS — K6389 Other specified diseases of intestine: Secondary | ICD-10-CM | POA: Diagnosis not present

## 2018-03-28 DIAGNOSIS — M6281 Muscle weakness (generalized): Secondary | ICD-10-CM | POA: Diagnosis not present

## 2018-03-28 DIAGNOSIS — I5032 Chronic diastolic (congestive) heart failure: Secondary | ICD-10-CM | POA: Diagnosis not present

## 2018-03-28 DIAGNOSIS — E1151 Type 2 diabetes mellitus with diabetic peripheral angiopathy without gangrene: Secondary | ICD-10-CM | POA: Diagnosis present

## 2018-03-28 DIAGNOSIS — K922 Gastrointestinal hemorrhage, unspecified: Secondary | ICD-10-CM | POA: Diagnosis present

## 2018-03-28 DIAGNOSIS — K3189 Other diseases of stomach and duodenum: Secondary | ICD-10-CM | POA: Diagnosis not present

## 2018-03-28 DIAGNOSIS — E876 Hypokalemia: Secondary | ICD-10-CM | POA: Diagnosis present

## 2018-03-28 DIAGNOSIS — D500 Iron deficiency anemia secondary to blood loss (chronic): Secondary | ICD-10-CM

## 2018-03-28 DIAGNOSIS — K921 Melena: Secondary | ICD-10-CM | POA: Diagnosis present

## 2018-03-28 DIAGNOSIS — I951 Orthostatic hypotension: Secondary | ICD-10-CM | POA: Diagnosis present

## 2018-03-28 DIAGNOSIS — N179 Acute kidney failure, unspecified: Secondary | ICD-10-CM | POA: Diagnosis present

## 2018-03-28 DIAGNOSIS — D638 Anemia in other chronic diseases classified elsewhere: Secondary | ICD-10-CM | POA: Diagnosis present

## 2018-03-28 DIAGNOSIS — K439 Ventral hernia without obstruction or gangrene: Secondary | ICD-10-CM | POA: Diagnosis present

## 2018-03-28 DIAGNOSIS — I130 Hypertensive heart and chronic kidney disease with heart failure and stage 1 through stage 4 chronic kidney disease, or unspecified chronic kidney disease: Secondary | ICD-10-CM | POA: Diagnosis not present

## 2018-03-28 DIAGNOSIS — R55 Syncope and collapse: Secondary | ICD-10-CM

## 2018-03-28 DIAGNOSIS — K228 Other specified diseases of esophagus: Secondary | ICD-10-CM | POA: Diagnosis not present

## 2018-03-28 DIAGNOSIS — K219 Gastro-esophageal reflux disease without esophagitis: Secondary | ICD-10-CM | POA: Diagnosis present

## 2018-03-28 DIAGNOSIS — Z85820 Personal history of malignant melanoma of skin: Secondary | ICD-10-CM | POA: Diagnosis not present

## 2018-03-28 DIAGNOSIS — I480 Paroxysmal atrial fibrillation: Secondary | ICD-10-CM | POA: Diagnosis present

## 2018-03-28 DIAGNOSIS — K644 Residual hemorrhoidal skin tags: Secondary | ICD-10-CM | POA: Diagnosis present

## 2018-03-28 DIAGNOSIS — K319 Disease of stomach and duodenum, unspecified: Secondary | ICD-10-CM | POA: Diagnosis present

## 2018-03-28 LAB — CBC WITH DIFFERENTIAL/PLATELET
BASOS PCT: 1 %
Basophils Absolute: 0.1 10*3/uL (ref 0–0.1)
EOS ABS: 0.2 10*3/uL (ref 0–0.7)
EOS PCT: 2 %
HCT: 33.7 % — ABNORMAL LOW (ref 40.0–52.0)
HEMOGLOBIN: 10.3 g/dL — AB (ref 13.0–18.0)
LYMPHS ABS: 1.4 10*3/uL (ref 1.0–3.6)
Lymphocytes Relative: 14 %
MCH: 20.9 pg — AB (ref 26.0–34.0)
MCHC: 30.7 g/dL — ABNORMAL LOW (ref 32.0–36.0)
MCV: 68 fL — ABNORMAL LOW (ref 80.0–100.0)
MONO ABS: 1.3 10*3/uL — AB (ref 0.2–1.0)
MONOS PCT: 13 %
NEUTROS PCT: 70 %
Neutro Abs: 7.2 10*3/uL — ABNORMAL HIGH (ref 1.4–6.5)
PLATELETS: 259 10*3/uL (ref 150–440)
RBC: 4.96 MIL/uL (ref 4.40–5.90)
RDW: 18.1 % — AB (ref 11.5–14.5)
WBC: 10.2 10*3/uL (ref 3.8–10.6)

## 2018-03-28 LAB — COMPREHENSIVE METABOLIC PANEL
ALK PHOS: 70 U/L (ref 38–126)
ALT: 15 U/L — ABNORMAL LOW (ref 17–63)
ANION GAP: 9 (ref 5–15)
AST: 22 U/L (ref 15–41)
Albumin: 3.9 g/dL (ref 3.5–5.0)
BUN: 42 mg/dL — ABNORMAL HIGH (ref 6–20)
CALCIUM: 8.8 mg/dL — AB (ref 8.9–10.3)
CO2: 30 mmol/L (ref 22–32)
Chloride: 96 mmol/L — ABNORMAL LOW (ref 101–111)
Creatinine, Ser: 2.01 mg/dL — ABNORMAL HIGH (ref 0.61–1.24)
GFR, EST AFRICAN AMERICAN: 33 mL/min — AB (ref >60)
GFR, EST NON AFRICAN AMERICAN: 28 mL/min — AB (ref >60)
Glucose, Bld: 151 mg/dL — ABNORMAL HIGH (ref 65–99)
POTASSIUM: 3.2 mmol/L — AB (ref 3.5–5.1)
Sodium: 135 mmol/L (ref 135–145)
TOTAL PROTEIN: 6.5 g/dL (ref 6.5–8.1)
Total Bilirubin: 0.7 mg/dL (ref 0.3–1.2)

## 2018-03-28 LAB — TROPONIN I

## 2018-03-28 LAB — TYPE AND SCREEN
ABO/RH(D): O POS
ANTIBODY SCREEN: NEGATIVE

## 2018-03-28 MED ORDER — SODIUM CHLORIDE 0.9 % IV BOLUS
1000.0000 mL | Freq: Once | INTRAVENOUS | Status: AC
  Administered 2018-03-28: 1000 mL via INTRAVENOUS

## 2018-03-28 NOTE — ED Provider Notes (Addendum)
[facility] Emergency Department Provider Note  ____________________________________________  Time seen: Approximately 10:49 PM  I have reviewed the triage vital signs and the nursing notes.   HISTORY  Chief Complaint Loss of Consciousness and Weakness    HPI Gary Chang is a 84 y.o. male who is in his usual state of health today when this evening he was walking between rooms in his house when he got very lightheaded and was worried he would pass out. He checked his blood pressure and found that it was 80/50. He called EMS. On transferring to the stretcher, the patient did pass out in the presence of EMS. They gave him a 500 L bolus en route.  symptoms intermittent, worse standing up, better lying down, severe.  Patient denies taking NSAIDs or steroids. Denies any history of GI bleed. He does take Eliquis. He denies any recent black or bloody stool. No trauma fever or chills belly pain or other acute complaints he can relate.      Past Medical History:  Diagnosis Date  . Abdominal aortic aneurysm (AAA) ([place])   . Allergic state   . Anxiety   . Arthritis   . Cancer ([place])    skin ca removed  . CHF (congestive heart failure) ([place])   . COPD (chronic obstructive pulmonary disease) ([place])   . Depression   . Diabetes mellitus    tingling in feet  . Dyspnea    with exertion  . Enlarged prostate    laser surgery  . GERD (gastroesophageal reflux disease)   . H/O hiatal hernia   . High cholesterol   . Hypertension    pcp  dr Jeneen Rinks [place]  [place]  . Kidney stones   . Melanoma ([place])   . Pneumonia   . Renal artery aneurysm [facility])      Patient Active Problem List   Diagnosis Date Noted  . Chronic diastolic heart failure ([place]) 03/04/2018  . Atrial fibrillation ([place]) 03/04/2018  . Diabetes ([place]) 03/04/2018  . Abdominal pain 01/30/2017  . Ventral hernia without obstruction or gangrene 01/30/2017  . Tension headache 04/30/2016  . Atypical  facial pain 04/30/2016  . Left-sided weakness   . Occlusion and stenosis of basilar artery   . Vertebral artery stenosis   . Basilar artery stenosis 02/28/2016  . Dizziness and giddiness 11/29/2013  . Abnormality of gait 11/29/2013  . Diabetic neuropathy ([place]) 11/29/2013  . Memory loss 11/29/2013  . Headache, paroxysmal hemicrania, episodic 11/29/2013     Past Surgical History:  Procedure Laterality Date  . ABDOMINAL AORTIC ANEURYSM REPAIR    . BACK SURGERY     lumbar  ,cervical surgeries  . CATARACT EXTRACTION W/PHACO Left 12/11/2016   Procedure: CATARACT EXTRACTION PHACO AND INTRAOCULAR LENS PLACEMENT (IOC);  Surgeon: Leandrew Koyanagi, MD;  Location: [facility] ORS;  Service: Ophthalmology;  Laterality: Left;  [place] 1:17 AP% 17.7 CDE 13.8 FLUID PACK LOT # [id number]  . CATARACT EXTRACTION W/PHACO Right 08/26/2017   Procedure: CATARACT EXTRACTION PHACO AND INTRAOCULAR LENS PLACEMENT ([place]) RIGHT;  Surgeon: Leandrew Koyanagi, MD;  Location: [place];  Service: Ophthalmology;  Laterality: Right;  IVA TOPICAL DIABETES - oral meds  . ESOPHAGOGASTRODUODENOSCOPY (EGD) WITH PROPOFOL N/A 02/11/2016   Procedure: ESOPHAGOGASTRODUODENOSCOPY (EGD) WITH PROPOFOL;  Surgeon: Hulen Luster, MD;  Location: [facility] ENDOSCOPY;  Service: Gastroenterology;  Laterality: N/A;  . EYE SURGERY     right cataract  . GREEN LIGHT LASER TURP (TRANSURETHRAL RESECTION OF PROSTATE N/A 07/14/2017   Procedure:  GREEN LIGHT LASER TURP (TRANSURETHRAL RESECTION OF PROSTATE;  Surgeon: Royston Cowper, MD;  Location: [facility] ORS;  Service: Urology;  Laterality: N/A;  . HERNIA REPAIR     inguinal  . KIDNEY STONE SURGERY    . LUMBAR LAMINECTOMY/DECOMPRESSION MICRODISCECTOMY  12/10/2011   Procedure: LUMBAR LAMINECTOMY/DECOMPRESSION MICRODISCECTOMY;  Surgeon: Ophelia Charter;  [facility] NEUR[facility]S;  Service: Neurosurgery;;  Thoracic One-Two Laminectomy     Prior to Admission medications   Medication Sig Start Date End Date  Taking? Authorizing Provider  apixaban (ELIQUIS) 5 MG TABS tablet Take 1 tablet by mouth 2 (two) times daily.    [provider]  aspirin EC 81 MG tablet Take 81 mg by mouth at bedtime.     [provider]  b complex vitamins tablet Take 1 tablet by mouth daily with lunch.     [provider]  Cholecalciferol (VITAMIN D3) 1000 units CAPS Take 1,000 Units by mouth daily with lunch.     [provider]  dicyclomine (BENTYL) 10 MG capsule Take 10 mg by mouth 4 (four) times daily as needed for spasms.     [provider]  docusate sodium (COLACE) 100 MG capsule Take 2 capsules (200 mg total) by mouth 2 (two) times daily. 07/14/17   Royston Cowper, MD  donepezil (ARICEPT) 5 MG tablet Take 5 mg by mouth every morning. 04/16/16   [provider]  glipiZIDE (GLUCOTROL XL) 2.5 MG 24 hr tablet Take 2.5 mg by mouth at bedtime.      [provider]  GLUCOSAMINE-CHONDROITIN PO Take 1 tablet by mouth 2 (two) times daily.     [provider]  hydrALAZINE (APRESOLINE) 100 MG tablet Take 100 mg by mouth 3 (three) times daily.  11/02/13   [provider]  hydrochlorothiazide (HYDRODIURIL) 25 MG tablet Take 25 mg by mouth daily.    [provider]  HYDROcodone-acetaminophen (NORCO/VICODIN) 5-325 MG per tablet Take 1 tablet by mouth 2 (two) times daily as needed for moderate pain.  10/25/13   [provider]  losartan (COZAAR) 100 MG tablet Take 100 mg by mouth every evening.     [provider]  Magnesium Oxide 250 MG TABS Take 250 mg by mouth every evening.    [provider]  Meth-Hyo-M Bl-Na Phos-Ph Sal (URO-MP) 118 MG CAPS Take 1 capsule by mouth 2 (two) times daily.    [provider]  metoprolol succinate (TOPROL-XL) 50 MG 24 hr tablet Take 50 mg by mouth daily.     [provider]  potassium chloride (K-DUR,KLOR-CON) 10 MEQ tablet Take 20 mEq by mouth 2 (two) times daily.     [provider]  sertraline (ZOLOFT) 100 MG tablet Take 100 mg by mouth every evening.     [provider]  torsemide (DEMADEX) 20 MG tablet Take 40 mg by mouth daily.  03/02/18   [provider]     Allergies Ezetimibe and Statins   Family History  Problem Relation Age of Onset  . Heart Problems Mother   . Tuberculosis Father   . Anesthesia problems Neg Hx   . Hypotension Neg Hx   . Malignant hyperthermia Neg Hx   . Pseudochol deficiency Neg Hx     Social History Social History   Tobacco Use  . Smoking status: Former Smoker    Types: Pipe    Last attempt to quit: 07/10/1987    Years since quitting: 30.7  . Smokeless tobacco:  Never Used  Substance Use Topics  . Alcohol use: Yes    Comment: occ 1-2 beers a month  . Drug use: No    Review of Systems  Constitutional:   No fever or chills.  ENT:   No sore throat. No rhinorrhea. Cardiovascular:   No chest pain or syncope. Respiratory:   No dyspnea or cough. Gastrointestinal:   Negative for abdominal pain, vomiting and diarrhea.  Musculoskeletal:   Negative for focal pain or swelling All other systems reviewed and are negative except as documented above in ROS and HPI.  ____________________________________________   PHYSICAL EXAM:  VITAL SIGNS: ED Triage Vitals  Enc Vitals Group     BP 03/28/18 2223 133/66     Pulse Rate 03/28/18 2223 85     Resp 03/28/18 2223 (!) 23     Temp 03/28/18 2223 98.6 F (37 C)     Temp Source 03/28/18 2223 Oral     SpO2 03/28/18 2223 100 %     Weight 03/28/18 2224 225 lb (102.1 kg)     Height 03/28/18 2224 5\' 8"  (1.727 m)     Head Circumference --      Peak Flow --      Pain Score 03/28/18 2224 0     Pain Loc --      Pain Edu? --      Excl. in [place]? --     Vital signs reviewed, nursing assessments reviewed.   Constitutional:   Alert and oriented. not in distress Eyes:   Conjunctivae are normal. EOMI. PERRL. ENT      Head:   Normocephalic and  atraumatic.      Nose:   No congestion/rhinnorhea.       Mouth/Throat:   dry mucous membranes, no pharyngeal erythema. No peritonsillar mass.       Neck:   No meningismus. Full ROM. Hematological/Lymphatic/Immunilogical:   No cervical lymphadenopathy. Cardiovascular:   RRR. Symmetric bilateral radial and DP pulses.  No murmurs.  Respiratory:   Normal respiratory effort without tachypnea/retractions. Breath sounds are clear and equal bilaterally. No wheezes/rales/rhonchi. Gastrointestinal:   Soft and nontender. Non distended.large ventral hernia. There is no CVA tenderness.  No rebound, rigidity, or guarding.rectal exam performed in the presence of nurse Ena Dawley. Brown stool, strongly Hemoccult positive, controls okay.  Musculoskeletal:   Normal range of motion in all extremities. No joint effusions.  No lower extremity tenderness.  No edema. Neurologic:   Normal speech and language.  Motor grossly intact. No acute focal neurologic deficits are appreciated.  Skin:    Skin is warm, dry and intact. No rash noted.  No petechiae, purpura, or bullae.  ____________________________________________    LABS (pertinent positives/negatives) (all labs ordered are listed, but only abnormal results are displayed) Labs Reviewed  CBC WITH DIFFERENTIAL/PLATELET  COMPREHENSIVE METABOLIC PANEL  TROPONIN I  CBG MONITORING, ED  TYPE AND SCREEN   ____________________________________________   EKG  interpreted by me Atrial fibrillation rate of 94, normal axis and intervals. Normal QRS ST segments and T waves.  ____________________________________________    RADIOLOGY  No results found.  ____________________________________________   PROCEDURES Procedures  ____________________________________________    CLINICAL IMPRESSION / ASSESSMENT AND PLAN / ED COURSE  Pertinent labs & imaging results that were available during my care of the patient were reviewed by me and considered in my medical  decision making (see chart for details).    patient not in distress, hemodynamically stable while lying supine in the stretcher, but presents  with a GI bleed in the setting of Eliquis use and finding of orthostatic hypotension by EMS resulting in syncope. We'll continue to monitor vital signs closely. Check labs including hemoglobin. If he has any further hemodynamic instability and require emergent blood transfusion. Currently he does not appear to be in shock. I discussed with the hospitalist for further management.  Clinical Course as of Mar 28 2334  Nancy Fetter Mar 28, 2018  2335 Labs reveal AKI, 1 point hemoglobin drop from a month ago.    [PS]    Clinical Course User Index [PS] Carrie Mew, MD     ____________________________________________   FINAL CLINICAL IMPRESSION(S) / ED DIAGNOSES    Final diagnoses:  Syncope, unspecified syncope type  Gastrointestinal hemorrhage, unspecified gastrointestinal hemorrhage type  Orthostatic hypotension     ED Discharge Orders    None      Portions of this note were generated with dragon dictation software. Dictation errors may occur despite best attempts at proofreading.    Carrie Mew, MD 03/28/18 Rosey Bath    Carrie Mew, MD 03/28/18 [id number]

## 2018-03-28 NOTE — ED Triage Notes (Signed)
Patient presents to Emergency Department via EMS from home with complaints of low BP as taken by wife.    Pt hx of AAA five years prior, AFIB and takes eliquis, EMS reports pt "passed out when transferring to stretcher"  Pt normally HTN  EMS gave 580ml NS bolus

## 2018-03-29 DIAGNOSIS — K921 Melena: Secondary | ICD-10-CM | POA: Diagnosis present

## 2018-03-29 DIAGNOSIS — Z87891 Personal history of nicotine dependence: Secondary | ICD-10-CM | POA: Diagnosis not present

## 2018-03-29 DIAGNOSIS — D124 Benign neoplasm of descending colon: Secondary | ICD-10-CM | POA: Diagnosis not present

## 2018-03-29 DIAGNOSIS — J449 Chronic obstructive pulmonary disease, unspecified: Secondary | ICD-10-CM | POA: Insufficient documentation

## 2018-03-29 DIAGNOSIS — K3189 Other diseases of stomach and duodenum: Secondary | ICD-10-CM | POA: Diagnosis not present

## 2018-03-29 DIAGNOSIS — I129 Hypertensive chronic kidney disease with stage 1 through stage 4 chronic kidney disease, or unspecified chronic kidney disease: Secondary | ICD-10-CM | POA: Diagnosis present

## 2018-03-29 DIAGNOSIS — N179 Acute kidney failure, unspecified: Secondary | ICD-10-CM | POA: Diagnosis present

## 2018-03-29 DIAGNOSIS — I951 Orthostatic hypotension: Secondary | ICD-10-CM | POA: Diagnosis present

## 2018-03-29 DIAGNOSIS — Z7982 Long term (current) use of aspirin: Secondary | ICD-10-CM | POA: Diagnosis not present

## 2018-03-29 DIAGNOSIS — D509 Iron deficiency anemia, unspecified: Secondary | ICD-10-CM

## 2018-03-29 DIAGNOSIS — K439 Ventral hernia without obstruction or gangrene: Secondary | ICD-10-CM | POA: Diagnosis present

## 2018-03-29 DIAGNOSIS — N183 Chronic kidney disease, stage 3 (moderate): Secondary | ICD-10-CM

## 2018-03-29 DIAGNOSIS — Z66 Do not resuscitate: Secondary | ICD-10-CM | POA: Diagnosis present

## 2018-03-29 DIAGNOSIS — D62 Acute posthemorrhagic anemia: Secondary | ICD-10-CM | POA: Diagnosis present

## 2018-03-29 DIAGNOSIS — E1151 Type 2 diabetes mellitus with diabetic peripheral angiopathy without gangrene: Secondary | ICD-10-CM | POA: Diagnosis present

## 2018-03-29 DIAGNOSIS — K922 Gastrointestinal hemorrhage, unspecified: Secondary | ICD-10-CM | POA: Diagnosis present

## 2018-03-29 DIAGNOSIS — I480 Paroxysmal atrial fibrillation: Secondary | ICD-10-CM | POA: Diagnosis present

## 2018-03-29 DIAGNOSIS — K449 Diaphragmatic hernia without obstruction or gangrene: Secondary | ICD-10-CM | POA: Diagnosis not present

## 2018-03-29 DIAGNOSIS — E876 Hypokalemia: Secondary | ICD-10-CM | POA: Diagnosis present

## 2018-03-29 DIAGNOSIS — K219 Gastro-esophageal reflux disease without esophagitis: Secondary | ICD-10-CM | POA: Diagnosis present

## 2018-03-29 DIAGNOSIS — K228 Other specified diseases of esophagus: Secondary | ICD-10-CM | POA: Diagnosis not present

## 2018-03-29 DIAGNOSIS — E7800 Pure hypercholesterolemia, unspecified: Secondary | ICD-10-CM | POA: Diagnosis present

## 2018-03-29 DIAGNOSIS — R531 Weakness: Secondary | ICD-10-CM | POA: Diagnosis present

## 2018-03-29 DIAGNOSIS — K644 Residual hemorrhoidal skin tags: Secondary | ICD-10-CM | POA: Diagnosis not present

## 2018-03-29 DIAGNOSIS — C180 Malignant neoplasm of cecum: Secondary | ICD-10-CM | POA: Diagnosis present

## 2018-03-29 DIAGNOSIS — D500 Iron deficiency anemia secondary to blood loss (chronic): Secondary | ICD-10-CM | POA: Diagnosis not present

## 2018-03-29 DIAGNOSIS — Z79899 Other long term (current) drug therapy: Secondary | ICD-10-CM | POA: Diagnosis not present

## 2018-03-29 DIAGNOSIS — N400 Enlarged prostate without lower urinary tract symptoms: Secondary | ICD-10-CM | POA: Diagnosis present

## 2018-03-29 DIAGNOSIS — E1122 Type 2 diabetes mellitus with diabetic chronic kidney disease: Secondary | ICD-10-CM | POA: Diagnosis present

## 2018-03-29 DIAGNOSIS — D638 Anemia in other chronic diseases classified elsewhere: Secondary | ICD-10-CM | POA: Diagnosis present

## 2018-03-29 DIAGNOSIS — Z85820 Personal history of malignant melanoma of skin: Secondary | ICD-10-CM | POA: Diagnosis not present

## 2018-03-29 DIAGNOSIS — Z7901 Long term (current) use of anticoagulants: Secondary | ICD-10-CM | POA: Diagnosis not present

## 2018-03-29 DIAGNOSIS — Z7984 Long term (current) use of oral hypoglycemic drugs: Secondary | ICD-10-CM | POA: Diagnosis not present

## 2018-03-29 LAB — GLUCOSE, CAPILLARY
GLUCOSE-CAPILLARY: 142 mg/dL — AB (ref 65–99)
Glucose-Capillary: 131 mg/dL — ABNORMAL HIGH (ref 65–99)
Glucose-Capillary: 148 mg/dL — ABNORMAL HIGH (ref 65–99)
Glucose-Capillary: 170 mg/dL — ABNORMAL HIGH (ref 65–99)
Glucose-Capillary: 219 mg/dL — ABNORMAL HIGH (ref 65–99)

## 2018-03-29 LAB — BASIC METABOLIC PANEL
ANION GAP: 9 (ref 5–15)
ANION GAP: 7 (ref 5–15)
BUN: 39 mg/dL — ABNORMAL HIGH (ref 6–20)
BUN: 37 mg/dL — ABNORMAL HIGH (ref 6–20)
CALCIUM: 8.4 mg/dL — AB (ref 8.9–10.3)
CHLORIDE: 100 mmol/L — AB (ref 101–111)
CO2: 28 mmol/L (ref 22–32)
CO2: 30 mmol/L (ref 22–32)
Calcium: 8.5 mg/dL — ABNORMAL LOW (ref 8.9–10.3)
Chloride: 101 mmol/L (ref 101–111)
Creatinine, Ser: 1.79 mg/dL — ABNORMAL HIGH (ref 0.61–1.24)
Creatinine, Ser: 1.69 mg/dL — ABNORMAL HIGH (ref 0.61–1.24)
GFR calc non Af Amer: 35 mL/min — ABNORMAL LOW (ref >60)
GFR, EST AFRICAN AMERICAN: 38 mL/min — AB (ref >60)
GFR, EST AFRICAN AMERICAN: 40 mL/min — AB (ref >60)
GFR, EST NON AFRICAN AMERICAN: 32 mL/min — AB (ref >60)
Glucose, Bld: 134 mg/dL — ABNORMAL HIGH (ref 65–99)
Glucose, Bld: 143 mg/dL — ABNORMAL HIGH (ref 65–99)
POTASSIUM: 2.7 mmol/L — AB (ref 3.5–5.1)
POTASSIUM: 2.8 mmol/L — AB (ref 3.5–5.1)
SODIUM: 137 mmol/L (ref 135–145)
Sodium: 138 mmol/L (ref 135–145)

## 2018-03-29 LAB — IRON AND TIBC
Iron: 21 ug/dL — ABNORMAL LOW (ref 45–182)
SATURATION RATIOS: 5 % — AB (ref 17.9–39.5)
TIBC: 418 ug/dL (ref 250–450)
UIBC: 397 ug/dL

## 2018-03-29 LAB — VITAMIN B12: VITAMIN B 12: 1027 pg/mL — AB (ref 180–914)

## 2018-03-29 LAB — CBC
HEMATOCRIT: 32.1 % — AB (ref 40.0–52.0)
HEMOGLOBIN: 9.7 g/dL — AB (ref 13.0–18.0)
MCH: 20.5 pg — ABNORMAL LOW (ref 26.0–34.0)
MCHC: 30.2 g/dL — ABNORMAL LOW (ref 32.0–36.0)
MCV: 68.1 fL — ABNORMAL LOW (ref 80.0–100.0)
Platelets: 226 10*3/uL (ref 150–440)
RBC: 4.71 MIL/uL (ref 4.40–5.90)
RDW: 18.1 % — AB (ref 11.5–14.5)
WBC: 9.3 10*3/uL (ref 3.8–10.6)

## 2018-03-29 LAB — MAGNESIUM: MAGNESIUM: 1.9 mg/dL (ref 1.7–2.4)

## 2018-03-29 LAB — FOLATE: FOLATE: 20.9 ng/mL (ref >5.9)

## 2018-03-29 LAB — HEMOGLOBIN: HEMOGLOBIN: 9.2 g/dL — AB (ref 13.0–18.0)

## 2018-03-29 LAB — FERRITIN: Ferritin: 16 ng/mL — ABNORMAL LOW (ref 24–336)

## 2018-03-29 MED ORDER — SODIUM CHLORIDE 0.9 % IV SOLN
80.0000 mg | Freq: Once | INTRAVENOUS | Status: AC
  Administered 2018-03-29: 80 mg via INTRAVENOUS
  Filled 2018-03-29: qty 80

## 2018-03-29 MED ORDER — SODIUM CHLORIDE 0.9 % IV SOLN
INTRAVENOUS | Status: DC
  Administered 2018-03-29 (×2): via INTRAVENOUS

## 2018-03-29 MED ORDER — SODIUM CHLORIDE 0.9 % IV SOLN
INTRAVENOUS | Status: AC
  Administered 2018-03-29: 04:00:00 via INTRAVENOUS

## 2018-03-29 MED ORDER — SERTRALINE HCL 50 MG PO TABS
100.0000 mg | ORAL_TABLET | Freq: Every evening | ORAL | Status: DC
  Administered 2018-03-29: 100 mg via ORAL
  Filled 2018-03-29: qty 2

## 2018-03-29 MED ORDER — MAGNESIUM CITRATE PO SOLN
1.0000 | Freq: Once | ORAL | Status: AC
  Administered 2018-03-29: 1 via ORAL
  Filled 2018-03-29: qty 296

## 2018-03-29 MED ORDER — ACETAMINOPHEN 650 MG RE SUPP: 650.0000 mg | Freq: Four times a day (QID) | RECTAL | Status: DC | PRN

## 2018-03-29 MED ORDER — PEG 3350-KCL-NA BICARB-NACL 420 G PO SOLR
4000.0000 mL | Freq: Once | ORAL | Status: AC
  Administered 2018-03-29: 4000 mL via ORAL
  Filled 2018-03-29: qty 4000

## 2018-03-29 MED ORDER — ONDANSETRON HCL 4 MG/2ML IJ SOLN: 4.0000 mg | Freq: Four times a day (QID) | INTRAMUSCULAR | Status: DC | PRN

## 2018-03-29 MED ORDER — SODIUM CHLORIDE 0.9 % IV SOLN
INTRAVENOUS | Status: DC
  Administered 2018-03-30: 1000 mL via INTRAVENOUS

## 2018-03-29 MED ORDER — ACETAMINOPHEN 325 MG PO TABS: 650.0000 mg | ORAL_TABLET | Freq: Four times a day (QID) | ORAL | Status: DC | PRN

## 2018-03-29 MED ORDER — SODIUM CHLORIDE 0.9 % IV BOLUS
500.0000 mL | Freq: Once | INTRAVENOUS | Status: AC
  Administered 2018-03-29: 500 mL via INTRAVENOUS

## 2018-03-29 MED ORDER — PANTOPRAZOLE SODIUM 40 MG IV SOLR
8.0000 mg/h | INTRAVENOUS | Status: DC
  Administered 2018-03-29: 8 mg/h via INTRAVENOUS
  Filled 2018-03-29: qty 80

## 2018-03-29 MED ORDER — POTASSIUM CHLORIDE CRYS ER 20 MEQ PO TBCR
40.0000 meq | EXTENDED_RELEASE_TABLET | Freq: Two times a day (BID) | ORAL | Status: AC
  Administered 2018-03-29 – 2018-03-30 (×3): 40 meq via ORAL
  Filled 2018-03-29 (×3): qty 2

## 2018-03-29 MED ORDER — INSULIN ASPART 100 UNIT/ML ~~LOC~~ SOLN
0.0000 [IU] | Freq: Four times a day (QID) | SUBCUTANEOUS | Status: DC
  Administered 2018-03-29: 1 [IU] via SUBCUTANEOUS
  Administered 2018-03-29: 3 [IU] via SUBCUTANEOUS
  Administered 2018-03-29: 1 [IU] via SUBCUTANEOUS
  Administered 2018-03-29: 2 [IU] via SUBCUTANEOUS
  Administered 2018-03-30 (×2): 1 [IU] via SUBCUTANEOUS
  Administered 2018-03-30 – 2018-03-31 (×2): 2 [IU] via SUBCUTANEOUS
  Filled 2018-03-29 (×8): qty 1

## 2018-03-29 MED ORDER — PANTOPRAZOLE SODIUM 40 MG IV SOLR: 40.0000 mg | Freq: Two times a day (BID) | INTRAVENOUS | Status: DC

## 2018-03-29 MED ORDER — PANTOPRAZOLE SODIUM 40 MG IV SOLR
40.0000 mg | Freq: Two times a day (BID) | INTRAVENOUS | Status: DC
  Administered 2018-03-29 – 2018-03-31 (×5): 40 mg via INTRAVENOUS
  Filled 2018-03-29 (×5): qty 40

## 2018-03-29 MED ORDER — ONDANSETRON HCL 4 MG PO TABS: 4.0000 mg | ORAL_TABLET | Freq: Four times a day (QID) | ORAL | Status: DC | PRN

## 2018-03-29 MED ORDER — POTASSIUM CHLORIDE 10 MEQ/100ML IV SOLN
10.0000 meq | INTRAVENOUS | Status: DC
  Administered 2018-03-29 (×3): 10 meq via INTRAVENOUS
  Filled 2018-03-29 (×4): qty 100

## 2018-03-29 NOTE — Consult Note (Signed)
Gary Darby, MD [address]  [place]  [place], [place] [address]  Main: [phone]  Fax: [phone] Pager: [phone]   Consultation  Referring Provider:     No ref. provider found Primary Care Physician:  [name], MD Primary Gastroenterologist:  Dr. Sherri Sear         Reason for Consultation:     Symptomatic anemia, guaiac positive stool  Date of Admission:  03/28/2018 Date of Consultation:  03/29/2018         HPI:   Gary Chang is a 84 y.o. male with history of CVA, on Eliquis, AAA repair, CHF, questionable complaince, presents to the emergency department on 03/29/2018 secondary to hypotension and syncope. He was found to have guaiac positive stool which was done due to low hemoglobin compared to baseline. His last normal hemoglobin was 14.2 in 06/2017. Since 11/2017 his hemoglobin has been gradually declining, nadir 9.2 with low MCV since 01/2018. He is not on iron supplementation as outpatient. He denies abdominal pain, nausea, vomiting, melena, hematochezia, hematemesis. There is no evidence of chronic liver disease. He does have worsening chronic kidney disease.  NSAIDs: none  Antiplts/Anticoagulants/Anti thrombotics: Eliquis  GI Procedures: He denies having a colonoscopy in the past EGD 2017 by Dr Candace Cruise - EGD LA Grade A reflux esophagitis. Dilated to 47F. - Normal stomach. - Normal examined duodenum. - No specimens collected.  Past Medical History:  Diagnosis Date  . Abdominal aortic aneurysm (AAA) ([place])   . Allergic state   . Anxiety   . Arthritis   . Cancer ([place])    skin ca removed  . CHF (congestive heart failure) ([place])   . COPD (chronic obstructive pulmonary disease) ([place])   . Depression   . Diabetes mellitus    tingling in feet  . Dyspnea    with exertion  . Enlarged prostate    laser surgery  . GERD (gastroesophageal reflux disease)   . H/O hiatal hernia   . High cholesterol   . Hypertension    pcp  dr Jeneen Rinks [place]   [place]  . Kidney stones   . Melanoma ([place])   . Pneumonia   . Renal artery aneurysm [facility])     Past Surgical History:  Procedure Laterality Date  . ABDOMINAL AORTIC ANEURYSM REPAIR    . BACK SURGERY     lumbar  ,cervical surgeries  . CATARACT EXTRACTION W/PHACO Left 12/11/2016   Procedure: CATARACT EXTRACTION PHACO AND INTRAOCULAR LENS PLACEMENT (IOC);  Surgeon: Leandrew Koyanagi, MD;  Location: [facility] ORS;  Service: Ophthalmology;  Laterality: Left;  [place] 1:17 AP% 17.7 CDE 13.8 FLUID PACK LOT # [id number] H  . CATARACT EXTRACTION W/PHACO Right 08/26/2017   Procedure: CATARACT EXTRACTION PHACO AND INTRAOCULAR LENS PLACEMENT ([place]) RIGHT;  Surgeon: Leandrew Koyanagi, MD;  Location: [place];  Service: Ophthalmology;  Laterality: Right;  IVA TOPICAL DIABETES - oral meds  . ESOPHAGOGASTRODUODENOSCOPY (EGD) WITH PROPOFOL N/A 02/11/2016   Procedure: ESOPHAGOGASTRODUODENOSCOPY (EGD) WITH PROPOFOL;  Surgeon: Hulen Luster, MD;  Location: [facility] ENDOSCOPY;  Service: Gastroenterology;  Laterality: N/A;  . EYE SURGERY     right cataract  . GREEN LIGHT LASER TURP (TRANSURETHRAL RESECTION OF PROSTATE N/A 07/14/2017   Procedure: GREEN LIGHT LASER TURP (TRANSURETHRAL RESECTION OF PROSTATE;  Surgeon: Royston Cowper, MD;  Location: [facility] ORS;  Service: Urology;  Laterality: N/A;  . HERNIA REPAIR     inguinal  . KIDNEY STONE SURGERY    . LUMBAR LAMINECTOMY/DECOMPRESSION MICRODISCECTOMY  12/10/2011   Procedure: LUMBAR LAMINECTOMY/DECOMPRESSION MICRODISCECTOMY;  Surgeon: Ophelia Charter;  [facility]: [facility] NEURO ORS;  Service: Neurosurgery;;  Thoracic One-Two Laminectomy    Prior to Admission medications   Medication Sig Start Date End Date Taking? Authorizing Provider  apixaban (ELIQUIS) 5 MG TABS tablet Take 1 tablet by mouth 2 (two) times daily.   Yes [provider]  aspirin EC 81 MG tablet Take 81 mg by mouth at bedtime.    Yes [provider]  b complex vitamins tablet Take 1  tablet by mouth daily with lunch.    Yes [provider]  Cholecalciferol (VITAMIN D3) 1000 units CAPS Take 1,000 Units by mouth daily with lunch.    Yes [provider]  dicyclomine (BENTYL) 10 MG capsule Take 10 mg by mouth 4 (four) times daily as needed for spasms.    Yes [provider]  docusate sodium (COLACE) 100 MG capsule Take 2 capsules (200 mg total) by mouth 2 (two) times daily. 07/14/17  Yes Royston Cowper, MD  donepezil (ARICEPT) 5 MG tablet Take 5 mg by mouth every morning. 04/16/16  Yes [provider]  glipiZIDE (GLUCOTROL XL) 2.5 MG 24 hr tablet Take 2.5 mg by mouth at bedtime.     Yes [provider]  GLUCOSAMINE-CHONDROITIN PO Take 1 tablet by mouth 2 (two) times daily.    Yes [provider]  hydrALAZINE (APRESOLINE) 100 MG tablet Take 100 mg by mouth 3 (three) times daily.  11/02/13  Yes [provider]  hydrochlorothiazide (HYDRODIURIL) 25 MG tablet Take 25 mg by mouth daily.   Yes [provider]  HYDROcodone-acetaminophen (NORCO/VICODIN) 5-325 MG per tablet Take 1 tablet by mouth 2 (two) times daily as needed for moderate pain.  10/25/13  Yes [provider]  losartan (COZAAR) 100 MG tablet Take 100 mg by mouth every evening.    Yes [provider]  Magnesium Oxide 250 MG TABS Take 250 mg by mouth every evening.   Yes [provider]  Meth-Hyo-M Bl-Na Phos-Ph Sal (URO-MP) 118 MG CAPS Take 1 capsule by mouth 2 (two) times daily.   Yes [provider]  metoprolol succinate (TOPROL-XL) 50 MG 24 hr tablet Take 50 mg by mouth daily.    Yes [provider]  potassium chloride (K-DUR,KLOR-CON) 10 MEQ tablet Take 20 mEq by mouth 2 (two) times daily.   Yes [provider]  sertraline (ZOLOFT) 100 MG tablet Take 100 mg by mouth every evening.    Yes [provider]  torsemide (DEMADEX) 20 MG tablet Take 40 mg by mouth daily.  03/02/18  Yes [provider]    Family History  Problem Relation Age of Onset  . Heart Problems Mother   . Tuberculosis Father   . Anesthesia problems Neg Hx   . Hypotension Neg Hx   . Malignant hyperthermia Neg Hx   . Pseudochol deficiency Neg Hx      Social History   Tobacco Use  . Smoking status: Former Smoker    Types: Pipe    Last attempt to quit: 07/10/1987    Years since quitting: 30.7  . Smokeless tobacco: Never Used  Substance Use Topics  . Alcohol use: Yes    Comment: occ 1-2 beers a month  . Drug use: No    Allergies as of 03/28/2018 - Review Complete 03/28/2018  Allergen Reaction Noted  . Ezetimibe Other (See Comments) 05/07/2016  . Statins Other (See Comments) 12/08/2011  Review of Systems:    All systems reviewed and negative except where noted in HPI.   Physical Exam:  Vital signs in last 24 hours: Temp:  [98.2 F (36.8 C)-98.6 F (37 C)] 98.2 F (36.8 C) (04/08 1152) Pulse Rate:  [74-96] 84 (04/08 1152) Resp:  [16-23] 18 (04/08 1152) BP: (108-144)/(60-77) 137/73 (04/08 1152) SpO2:  [98 %-100 %] 98 % (04/08 1152) Weight:  [202 lb 9.6 oz (91.9 kg)-225 lb (102.1 kg)] 202 lb 9.6 oz (91.9 kg) (04/08 0248)   General:   Pleasant, cooperative in NAD Head:  Normocephalic and atraumatic. Eyes:   No icterus.   Conjunctiva pink. PERRLA. Ears:  Normal auditory acuity. Neck:  Supple; no masses or thyroidomegaly Lungs: Respirations even and unlabored. Lungs clear to auscultation bilaterally.   No wheezes, crackles, or rhonchi.  Heart:  Regular rate and rhythm;  Without murmur, clicks, rubs or gallops Abdomen:  Soft, nondistended, nontender. Normal bowel sounds. No appreciable masses or hepatomegaly.  No rebound or guarding.  Rectal:  Not performed. Msk:  Symmetrical without gross deformities.  Strength normal  Extremities:  Without edema, cyanosis or clubbing. Neurologic:  Alert and oriented x3;  grossly normal neurologically. Skin:  Intact without significant lesions  or rashes. Psych:  Alert and cooperative. Normal affect.  LAB RESULTS: CBC Latest Ref Rng & Units 03/29/2018 03/29/2018 03/28/2018  WBC 3.8 - 10.6 K/uL - 9.3 10.2  Hemoglobin 13.0 - 18.0 g/dL 9.2(L) 9.7(L) 10.3(L)  Hematocrit 40.0 - 52.0 % - 32.1(L) 33.7(L)  Platelets 150 - 440 K/uL - 226 259    BMET BMP Latest Ref Rng & Units 03/29/2018 03/29/2018 03/28/2018  Glucose 65 - 99 mg/dL 143(H) 134(H) 151(H)  BUN 6 - 20 mg/dL 37(H) 39(H) 42(H)  Creatinine 0.61 - 1.24 mg/dL 1.69(H) 1.79(H) 2.01(H)  Sodium 135 - 145 mmol/L 138 137 135  Potassium 3.5 - 5.1 mmol/L 2.8(L) 2.7(LL) 3.2(L)  Chloride 101 - 111 mmol/L 101 100(L) 96(L)  CO2 22 - 32 mmol/L '30 28 30  '$ Calcium 8.9 - 10.3 mg/dL 8.4(L) 8.5(L) 8.8(L)    LFT Hepatic Function Latest Ref Rng & Units 03/28/2018 01/05/2017 12/26/2016  Total Protein 6.5 - 8.1 g/dL 6.5 7.1 7.5  Albumin 3.5 - 5.0 g/dL 3.9 4.3 4.4  AST 15 - 41 U/L '22 25 30  '$ ALT 17 - 63 U/L 15(L) 22 19  Alk Phosphatase 38 - 126 U/L 70 87 106  Total Bilirubin 0.3 - 1.2 mg/dL 0.7 0.7 0.9     STUDIES: No results found.    Impression / Plan:   ALIM CATTELL is a 84 y.o. male with vasculopathy, on Eliquis presents with severe symptomatic anemia. He has microcytic anemia concerning for iron deficiency and occult GI blood loss. There is no evidence of active GI bleed  - Check ferritin, iron, TIBC, vitamin B12 and folate levels - discussed with patient about EGD and colonoscopy to be performed tomorrow and patient is agreeable - Continue to hold Eliquis - Clear liquid diet - Bowel prep today - Nothing by mouth past midnight  I have discussed alternative options, risks & benefits,  which include, but are not limited to, bleeding, infection, perforation,respiratory complication & drug reaction.  The patient agrees with this plan & written consent will be obtained.    Thank you for involving me in the care of this patient.      LOS: 0 days   Sherri Sear, MD  03/29/2018, 12:28  PM   Note: This dictation was  prepared with Dragon dictation along with smaller phrase technology. Any transcriptional errors that result from this process are unintentional.

## 2018-03-29 NOTE — Progress Notes (Signed)
[facility] [facility] [place] NAME: Gary Chang    MR#:  [id number]  DATE OF BIRTH:  1928/01/14  SUBJECTIVE:  CHIEF COMPLAINT:   Chief Complaint  Patient presents with  . Loss of Consciousness  . Weakness   Generalized weakness.  No melena or bloody stool. REVIEW OF SYSTEMS:  Review of Systems  Constitutional: Positive for malaise/fatigue. Negative for chills and fever.  HENT: Negative for sore throat.   Eyes: Negative for blurred vision and double vision.  Respiratory: Negative for cough, hemoptysis, shortness of breath, wheezing and stridor.   Cardiovascular: Negative for chest pain, palpitations, orthopnea and leg swelling.  Gastrointestinal: Negative for abdominal pain, blood in stool, diarrhea, melena, nausea and vomiting.  Genitourinary: Negative for dysuria, flank pain and hematuria.  Musculoskeletal: Negative for back pain and joint pain.  Skin: Negative for rash.  Neurological: Negative for dizziness, sensory change, focal weakness, seizures, loss of consciousness, weakness and headaches.  Endo/Heme/Allergies: Negative for polydipsia.  Psychiatric/Behavioral: Negative for depression. The patient is not nervous/anxious.     DRUG ALLERGIES:   Allergies  Allergen Reactions  . Ezetimibe Other (See Comments)    "Numbness and tingling"  . Statins Other (See Comments)    "Numbness and tingling"   VITALS:  Blood pressure 137/73, pulse 84, temperature 98.2 F (36.8 C), temperature source Oral, resp. rate 18, height 5\' 7"  (1.702 m), weight 202 lb 9.6 oz (91.9 kg), SpO2 98 %. PHYSICAL EXAMINATION:  Physical Exam  Constitutional: He is oriented to person, place, and time. He appears well-developed.  HENT:  Head: Normocephalic.  Mouth/Throat: Oropharynx is clear and moist.  Eyes: Pupils are equal, round, and reactive to light. Conjunctivae and EOM are normal. No scleral icterus.  Neck: Normal range of motion. Neck supple. No JVD  present. No tracheal deviation present.  Cardiovascular: Normal rate, regular rhythm and normal heart sounds. Exam reveals no gallop.  No murmur heard. Pulmonary/Chest: Effort normal and breath sounds normal. No respiratory distress. He has no wheezes. He has no rales.  Abdominal: Soft. Bowel sounds are normal. He exhibits no distension. There is no tenderness. There is no rebound.  Musculoskeletal: Normal range of motion. He exhibits no edema or tenderness.  Neurological: He is alert and oriented to person, place, and time. No cranial nerve deficit.  Skin: No rash noted. No erythema.   LABORATORY PANEL:  Male CBC Recent Labs  Lab 03/29/18 0319 03/29/18 0953  WBC 9.3  --   HGB 9.7* 9.2*  HCT 32.1*  --   PLT 226  --    ------------------------------------------------------------------------------------------------------------------ Chemistries  Recent Labs  Lab 03/28/18 2229  03/29/18 0426  NA 135   < > 138  K 3.2*   < > 2.8*  CL 96*   < > 101  CO2 30   < > 30  GLUCOSE 151*   < > 143*  BUN 42*   < > 37*  CREATININE 2.01*   < > 1.69*  CALCIUM 8.8*   < > 8.4*  MG  --   --  1.9  AST 22  --   --   ALT 15*  --   --   ALKPHOS 70  --   --   BILITOT 0.7  --   --    < > = values in this interval not displayed.   RADIOLOGY:  No results found. ASSESSMENT AND PLAN:   GI bleed Continue PPI IV, follow-up GI consult, hold  Eliquis. Clear liquid diet, n.p.o. after midnight for possible endoscopy in a.m.  Anemia of chronic disease and due to acute blood loss. Follow-up hemoglobin.  PRBC transfusion as needed.    Acute renal failure superimposed on stage 3 chronic kidney disease  Avoid nephrotoxins, improving with IV fluid support.  Hypokalemia.  Give potassium supplement and follow-up level.  PAF (paroxysmal atrial fibrillation) Hold Lopressor and hydralazine due to low side blood pressure.  Hold torsemide, HCTZ,  and losartan due to renal failure.  Hold Eliquis due to GI  bleeding.    Diabetes ([place]) -sliding scale insulin with corresponding glucose checks   GERD (gastroesophageal reflux disease) -PPI as above  Generalized weakness.  PT evaluation. All the records are reviewed and case discussed with Care Management/Social Worker. Management plans discussed with the patient, family and they are in agreement.  CODE STATUS: DNR  TOTAL TIME TAKING CARE OF THIS PATIENT: 36 minutes.   More than 50% of the time was spent in counseling/coordination of care: YES  POSSIBLE D/C IN 2 DAYS, DEPENDING ON CLINICAL CONDITION.   Demetrios Loll M.D on 03/29/2018 at 5:04 PM  Between 7am to 6pm - Pager - [phone]  After 6pm go to [email or web address] - [facility]

## 2018-03-29 NOTE — ED Notes (Addendum)
Report finished att pt reports feeling "terrible and lightheaded" after family member sat pt upright, BP 99/51   Dr Jannifer Franklin notified, orders received

## 2018-03-29 NOTE — ED Notes (Signed)
Delay to call report d/t to pt in adjacent room admission ready at same time

## 2018-03-29 NOTE — Progress Notes (Signed)
Initial Nutrition Assessment  DOCUMENTATION CODES:   Not applicable  INTERVENTION:   Ensure Enlive po BID when diet advanced, each supplement provides 350 kcal and 20 grams of protein  MVI daily when diet advanced  NUTRITION DIAGNOSIS:   Inadequate oral intake related to acute illness as evidenced by other (comment)(pt on clear liquid diet ).  GOAL:   Patient will meet greater than or equal to 90% of their needs  MONITOR:   PO intake, Supplement acceptance, Diet advancement, Labs, Weight trends, I & O's  REASON FOR ASSESSMENT:   Malnutrition Screening Tool    ASSESSMENT:   84 y/o male with h/o CKD III, DM, GERD admitted with GIB and syncope    Met with pt in room today. Pt reports good appetite and oral intake at baseline. Pt does report that as he has gotten older his oral intake has been slowly decreasing. Pt reports that he has noticed that he has lost alot of the muscles in his arms. Per chart, it appears pt has lost 18lbs(8%) over the past 3-4 weeks; RD is unsure if any true weight loss as there is only one recorded weight this admit. RD spoke with pt today regarding the importance of adequate protein intake needed to preserve lean muscle. Pt would like to have vanilla Ensure once his diet is advanced. Pt initiated on a clear liquid diet today; pt eating 100% of liquid diet. Pt with low potassium today; this is being supplemented.   Medications reviewed and include: insulin, protonix, KCl, zoloft  Labs reviewed: K 2.8(L), BUN 37(H), creat 1.69(H), Ca 8.4(L), Mg 1.9 wnl Hgb 9.2(L), Hct 32.1(L) cbgs- 151, 134, 143 x 24hrs  NUTRITION - FOCUSED PHYSICAL EXAM:    Most Recent Value  Orbital Region  No depletion  Upper Arm Region  Mild depletion  Thoracic and Lumbar Region  No depletion  Buccal Region  No depletion  Temple Region  No depletion  Clavicle Bone Region  No depletion  Clavicle and Acromion Bone Region  No depletion  Scapular Bone Region  No depletion   Dorsal Hand  No depletion  Patellar Region  Mild depletion  Anterior Thigh Region  Mild depletion  Posterior Calf Region  Mild depletion  Edema (RD Assessment)  None  Hair  Reviewed  Eyes  Reviewed  Mouth  Reviewed  Skin  Reviewed  Nails  Reviewed     Diet Order:  Diet clear liquid Room service appropriate? Yes; Fluid consistency: Thin Diet NPO time specified  EDUCATION NEEDS:   Education needs have been addressed  Skin: Reviewed RN Assessment  Last BM:  4/8- type 7  Height:   Ht Readings from Last 1 Encounters:  03/29/18 5' 7" (1.702 m)    Weight:   Wt Readings from Last 1 Encounters:  03/29/18 202 lb 9.6 oz (91.9 kg)    Ideal Body Weight:  67 kg  BMI:  Body mass index is 31.73 kg/m.  Estimated Nutritional Needs:   Kcal:  1700-2000kcal/day   Protein:  92-101g/day   Fluid:  >1.7L/day     MS, RD, LDN Pager #- [phone] After Hours Pager: [phone]  

## 2018-03-29 NOTE — ED Notes (Signed)
Transferred to 204

## 2018-03-29 NOTE — Consult Note (Signed)
[facility] Cardiology  CARDIOLOGY CONSULT NOTE  Patient ID: Gary Chang MRN: [id number] DOB/AGE: 1928/01/25 84 y.o.  Admit date: 03/28/2018 Referring Physician Bridgett Larsson Primary Physician [name] Primary Cardiologist [facility]  Reason for Consultation AF  HPI: Patient is a 84 y.o. Male who presented to the emergency department 03/29/2018 with hypotension and syncope.  In the emergency department, the patient was found to be heme positive and  Hgb/HCT was markedly decreased from his baseline.  Patient is currently on Eliquis for stroke prevention with questionable compliance.  He notes no chest pain or heart palpitations.     Review of systems complete and found to be negative unless listed above     Past Medical History:  Diagnosis Date  . Abdominal aortic aneurysm (AAA) ([place])   . Allergic state   . Anxiety   . Arthritis   . Cancer ([place])    skin ca removed  . CHF (congestive heart failure) ([place])   . COPD (chronic obstructive pulmonary disease) ([place])   . Depression   . Diabetes mellitus    tingling in feet  . Dyspnea    with exertion  . Enlarged prostate    laser surgery  . GERD (gastroesophageal reflux disease)   . H/O hiatal hernia   . High cholesterol   . Hypertension    pcp  dr Jeneen Rinks [place]  [place]  . Kidney stones   . Melanoma ([place])   . Pneumonia   . Renal artery aneurysm [facility])     Past Surgical History:  Procedure Laterality Date  . ABDOMINAL AORTIC ANEURYSM REPAIR    . BACK SURGERY     lumbar  ,cervical surgeries  . CATARACT EXTRACTION W/PHACO Left 12/11/2016   Procedure: CATARACT EXTRACTION PHACO AND INTRAOCULAR LENS PLACEMENT (IOC);  Surgeon: Leandrew Koyanagi, MD;  Location: [facility] ORS;  Service: Ophthalmology;  Laterality: Left;  [place] 1:17 AP% 17.7 CDE 13.8 FLUID PACK LOT # [id number]  . CATARACT EXTRACTION W/PHACO Right 08/26/2017   Procedure: CATARACT EXTRACTION PHACO AND INTRAOCULAR LENS PLACEMENT ([place]) RIGHT;  Surgeon: Leandrew Koyanagi, MD;   Location: [place];  Service: Ophthalmology;  Laterality: Right;  IVA TOPICAL DIABETES - oral meds  . ESOPHAGOGASTRODUODENOSCOPY (EGD) WITH PROPOFOL N/A 02/11/2016   Procedure: ESOPHAGOGASTRODUODENOSCOPY (EGD) WITH PROPOFOL;  Surgeon: Hulen Luster, MD;  [facility] ENDOSCOPY;  Service: Gastroenterology;  Laterality: N/A;  . EYE SURGERY     right cataract  . GREEN LIGHT LASER TURP (TRANSURETHRAL RESECTION OF PROSTATE N/A 07/14/2017   Procedure: GREEN LIGHT LASER TURP (TRANSURETHRAL RESECTION OF PROSTATE;  Surgeon: Royston Cowper, MD;  Location: [facility] ORS;  Service: Urology;  Laterality: N/A;  . HERNIA REPAIR     inguinal  . KIDNEY STONE SURGERY    . LUMBAR LAMINECTOMY/DECOMPRESSION MICRODISCECTOMY  12/10/2011   Procedure: LUMBAR LAMINECTOMY/DECOMPRESSION MICRODISCECTOMY;  Surgeon: Ophelia Charter;  Location: [facility] NEURO ORS;  Service: Neurosurgery;;  Thoracic One-Two Laminectomy    Medications Prior to Admission  Medication Sig Dispense Refill Last Dose  . apixaban (ELIQUIS) 5 MG TABS tablet Take 1 tablet by mouth 2 (two) times daily.   03/29/2018 at 1800  . aspirin EC 81 MG tablet Take 81 mg by mouth at bedtime.    03/28/2018 at 2100  . b complex vitamins tablet Take 1 tablet by mouth daily with lunch.    03/29/2018 at 1200  . Cholecalciferol (VITAMIN D3) 1000 units CAPS Take 1,000 Units by mouth daily with lunch.    03/29/2018 at 1200  .  dicyclomine (BENTYL) 10 MG capsule Take 10 mg by mouth 4 (four) times daily as needed for spasms.    Past Week at Unknown time  . docusate sodium (COLACE) 100 MG capsule Take 2 capsules (200 mg total) by mouth 2 (two) times daily. 120 capsule 3 03/29/2018 at 1800  . donepezil (ARICEPT) 5 MG tablet Take 5 mg by mouth every morning.   03/29/2018 at 0800  . glipiZIDE (GLUCOTROL XL) 2.5 MG 24 hr tablet Take 2.5 mg by mouth at bedtime.     03/28/2018 at 2100  . GLUCOSAMINE-CHONDROITIN PO Take 1 tablet by mouth 2 (two) times daily.    03/29/2018 at 1800  . hydrALAZINE  (APRESOLINE) 100 MG tablet Take 100 mg by mouth 3 (three) times daily.    03/29/2018 at 1800  . hydrochlorothiazide (HYDRODIURIL) 25 MG tablet Take 25 mg by mouth daily.   03/29/2018 at 0800  . HYDROcodone-acetaminophen (NORCO/VICODIN) 5-325 MG per tablet Take 1 tablet by mouth 2 (two) times daily as needed for moderate pain.    Past Week at Unknown time  . losartan (COZAAR) 100 MG tablet Take 100 mg by mouth every evening.    03/29/2018  . Magnesium Oxide 250 MG TABS Take 250 mg by mouth every evening.   03/29/2018 at Unknown time  . Meth-Hyo-M Bl-Na Phos-Ph Sal (URO-MP) 118 MG CAPS Take 1 capsule by mouth 2 (two) times daily.   03/29/2018 at Unknown time  . metoprolol succinate (TOPROL-XL) 50 MG 24 hr tablet Take 50 mg by mouth daily.    03/29/2018 at Unknown time  . potassium chloride (K-DUR,KLOR-CON) 10 MEQ tablet Take 20 mEq by mouth 2 (two) times daily.   03/29/2018 at Unknown time  . sertraline (ZOLOFT) 100 MG tablet Take 100 mg by mouth every evening.    03/29/2018 at Unknown time  . torsemide (DEMADEX) 20 MG tablet Take 40 mg by mouth daily.    03/29/2018 at Unknown time   Social History   Socioeconomic History  . Marital status: Married    Spouse name: [name]  . Number of children: 2  . Years of education: HS  . Highest education level: Not on file  Occupational History  . Occupation: Retired  Scientific laboratory technician  . Financial resource strain: Not on file  . Food insecurity:    Worry: Not on file    Inability: Not on file  . Transportation needs:    Medical: Not on file    Non-medical: Not on file  Tobacco Use  . Smoking status: Former Smoker    Types: Pipe    Last attempt to quit: 07/10/1987    Years since quitting: 30.7  . Smokeless tobacco: Never Used  Substance and Sexual Activity  . Alcohol use: Yes    Comment: occ 1-2 beers a month  . Drug use: No  . Sexual activity: Not Currently  Lifestyle  . Physical activity:    Days per week: Not on file    Minutes per session: Not on file  .  Stress: Not on file  Relationships  . Social connections:    Talks on phone: Not on file    Gets together: Not on file    Attends religious service: Not on file    Active member of club or organization: Not on file    Attends meetings of clubs or organizations: Not on file    Relationship status: Not on file  . Intimate partner violence:    Fear of current or  ex partner: Not on file    Emotionally abused: Not on file    Physically abused: Not on file    Forced sexual activity: Not on file  Other Topics Concern  . Not on file  Social History Narrative   Patient lives at home with his spouse.   Caffeine Use: 1-2 cups daily    Family History  Problem Relation Age of Onset  . Heart Problems Mother   . Tuberculosis Father   . Anesthesia problems Neg Hx   . Hypotension Neg Hx   . Malignant hyperthermia Neg Hx   . Pseudochol deficiency Neg Hx       Review of systems complete and found to be negative unless listed above      PHYSICAL EXAM  General: Well developed, well nourished, in no acute distress HEENT:  Normocephalic and atramatic Neck:  No JVD.  Lungs: Clear bilaterally to auscultation and percussion. Heart: Irregular. Normal S1 and S2 without gallops or murmurs.  Abdomen: Bowel sounds are positive, abdomen soft and non-tender  Msk:  Back normal, normal gait. Normal strength and tone for age. Extremities: No clubbing, cyanosis or edema.   Neuro: Alert and oriented X 3. Psych:  Good affect, responds appropriately  Labs:   Lab Results  Component Value Date   WBC 9.3 03/29/2018   HGB 9.2 (L) 03/29/2018   HCT 32.1 (L) 03/29/2018   MCV 68.1 (L) 03/29/2018   PLT 226 03/29/2018    Recent Labs  Lab 03/28/18 2229  03/29/18 0426  NA 135   < > 138  K 3.2*   < > 2.8*  CL 96*   < > 101  CO2 30   < > 30  BUN 42*   < > 37*  CREATININE 2.01*   < > 1.69*  CALCIUM 8.8*   < > 8.4*  PROT 6.5  --   --   BILITOT 0.7  --   --   ALKPHOS 70  --   --   ALT 15*  --   --    AST 22  --   --   GLUCOSE 151*   < > 143*   < > = values in this interval not displayed.   Lab Results  Component Value Date   CKTOTAL 89 01/24/2012   CKMB 1.7 01/24/2012   TROPONINI <0.03 03/28/2018   No results found for: CHOL No results found for: HDL No results found for: LDLCALC No results found for: TRIG No results found for: CHOLHDL No results found for: LDLDIRECT    Radiology: Dg Chest 2 View  Result Date: 02/27/2018 CLINICAL DATA:  Shortness of Breath EXAM: CHEST - 2 VIEW COMPARISON:  02/10/2018 FINDINGS: Cardiac shadow is stable. Aortic calcifications are again seen. Lungs are well aerated bilaterally. Small pleural effusions are seen stable from the prior exam. No focal infiltrate is noted. Degenerative changes of the thoracic spine are noted. Postsurgical changes are noted in the cervical spine. IMPRESSION: Stable small effusions posteriorly. No new focal abnormality is noted. Electronically Signed   By: Inez Catalina M.D.   On: 02/27/2018 20:16   Ct Head Wo Contrast  Result Date: 02/27/2018 CLINICAL DATA:  Recent syncopal episode with head injury and neck pain, initial encounter EXAM: CT HEAD WITHOUT CONTRAST CT CERVICAL SPINE WITHOUT CONTRAST TECHNIQUE: Multidetector CT imaging of the head and cervical spine was performed following the standard protocol without intravenous contrast. Multiplanar CT image reconstructions of the cervical spine were also generated. COMPARISON:  03/06/2016 FINDINGS: CT HEAD FINDINGS Brain: Mild atrophic changes are identified. No findings to suggest acute hemorrhage, acute infarction or space-occupying mass lesion are noted. Vascular: No hyperdense vessel or unexpected calcification. Skull: Normal. Negative for fracture or focal lesion. Sinuses/Orbits: No acute finding. Other: None. CT CERVICAL SPINE FINDINGS Alignment: Mild loss of the normal cervical lordosis is noted. Skull base and vertebrae: 7 cervical segments are well visualized. Changes of prior  fusion from C4-C7 are seen with anterior fixation. Diffuse facet hypertrophic changes are identified. No acute fracture or acute facet abnormality is noted. Soft tissues and spinal canal: No acute soft tissue abnormality is noted in the cervical spine. Upper chest: Moderate size right pleural effusion is noted greater than that expected based on recent chest x-ray. Other: None IMPRESSION: CT of the head: Mild atrophic changes without acute abnormality. CT of the cervical spine: Postsurgical and degenerative changes as described. Right-sided pleural effusion greater than that expected based on recent chest x-ray. Electronically Signed   By: Inez Catalina M.D.   On: 02/27/2018 20:33   Ct Cervical Spine Wo Contrast  Result Date: 02/27/2018 CLINICAL DATA:  Recent syncopal episode with head injury and neck pain, initial encounter EXAM: CT HEAD WITHOUT CONTRAST CT CERVICAL SPINE WITHOUT CONTRAST TECHNIQUE: Multidetector CT imaging of the head and cervical spine was performed following the standard protocol without intravenous contrast. Multiplanar CT image reconstructions of the cervical spine were also generated. COMPARISON:  03/06/2016 FINDINGS: CT HEAD FINDINGS Brain: Mild atrophic changes are identified. No findings to suggest acute hemorrhage, acute infarction or space-occupying mass lesion are noted. Vascular: No hyperdense vessel or unexpected calcification. Skull: Normal. Negative for fracture or focal lesion. Sinuses/Orbits: No acute finding. Other: None. CT CERVICAL SPINE FINDINGS Alignment: Mild loss of the normal cervical lordosis is noted. Skull base and vertebrae: 7 cervical segments are well visualized. Changes of prior fusion from C4-C7 are seen with anterior fixation. Diffuse facet hypertrophic changes are identified. No acute fracture or acute facet abnormality is noted. Soft tissues and spinal canal: No acute soft tissue abnormality is noted in the cervical spine. Upper chest: Moderate size right  pleural effusion is noted greater than that expected based on recent chest x-ray. Other: None IMPRESSION: CT of the head: Mild atrophic changes without acute abnormality. CT of the cervical spine: Postsurgical and degenerative changes as described. Right-sided pleural effusion greater than that expected based on recent chest x-ray. Electronically Signed   By: Inez Catalina M.D.   On: 02/27/2018 20:33    EKG: AF 94 bpm  ASSESSMENT AND PLAN:  Patient is a 84 y.o. Male with a known history of atrial fibrillation that is currently on metoprolol and Eliquis for stroke prevention.  He does not note a history of blood in stool and was found to be significantly anemic.  Recommendations  1.  Agree with current treatment plan 2.  Hold Eliquis for now, pending GI evaluation   Signed: Isaias Cowman MD,PhD, [facility] 03/29/2018, 12:55 PM

## 2018-03-29 NOTE — ED Notes (Signed)
No pharmacy to review meds, done by this RN, call to Jonelle Sidle, RN of plan of care

## 2018-03-29 NOTE — Progress Notes (Signed)
PT Cancellation Note  Patient Details Name: Gary Chang MRN: [id number] DOB: [birth year]   Cancelled Treatment:      PT consult received, chart reviewed. Pt K+ noted to be low (2.8) and Pt is not on telemetry.  Per PT guidelines, Pt not appropriate for PT at this time.  Will reattempt eval/treatment at a later date/time when medically appropriate.   Atharva Mirsky Mondrian-Pardue, SPT 03/29/2018, 2:48 PM

## 2018-03-29 NOTE — Progress Notes (Signed)
Chaplain engaged patient and discussed his illness, family, and career as a Engineering geologist. Patient seems confused on why he is here. After active listening, Chaplain prayed with the patient.

## 2018-03-29 NOTE — H&P (Signed)
[facility] NAME: Gary Chang    MR#:  [id number]  DATE OF BIRTH:  [birth year]  DATE OF ADMISSION:  03/28/2018  PRIMARY CARE PHYSICIAN: [name], MD   REQUESTING/REFERRING PHYSICIAN: Joni Fears, MD  CHIEF COMPLAINT:   Chief Complaint  Patient presents with  . Loss of Consciousness  . Weakness    HISTORY OF PRESENT ILLNESS:  Gary Chang  is a 84 y.o. male who presents with low blood pressure at home and syncope or near syncope.  Here in the ED the patient was found to have low and normal blood pressure, and was noted to be guaiac positive him a raising concern for possible GI bleed.  His hemoglobin is relatively stable but slightly decreased from recent prior values, and he has some acute on chronic renal failure.  PAST MEDICAL HISTORY:   Past Medical History:  Diagnosis Date  . Abdominal aortic aneurysm (AAA) ([place])   . Allergic state   . Anxiety   . Arthritis   . Cancer ([place])    skin ca removed  . CHF (congestive heart failure) ([place])   . COPD (chronic obstructive pulmonary disease) ([place])   . Depression   . Diabetes mellitus    tingling in feet  . Dyspnea    with exertion  . Enlarged prostate    laser surgery  . GERD (gastroesophageal reflux disease)   . H/O hiatal hernia   . High cholesterol   . Hypertension    pcp  dr Jeneen Rinks [place]  [place]  . Kidney stones   . Melanoma ([place])   . Pneumonia   . 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 ABDOMINAL AORTIC ANEURYSM REPAIR    . BACK SURGERY     lumbar  ,cervical surgeries  . CATARACT EXTRACTION W/PHACO Left 12/11/2016   Procedure: CATARACT EXTRACTION PHACO AND INTRAOCULAR LENS PLACEMENT (IOC);  Surgeon: Leandrew Koyanagi, MD;  Location: [facility] ORS;  Service: Ophthalmology;  Laterality: Left;  [place] 1:17 AP% 17.7 CDE 13.8 FLUID PACK LOT # [id number]  . CATARACT  EXTRACTION W/PHACO Right 08/26/2017   Procedure: CATARACT EXTRACTION PHACO AND INTRAOCULAR LENS PLACEMENT ([place]) RIGHT;  Surgeon: Leandrew Koyanagi, MD;  Location: [place];  Service: Ophthalmology;  Laterality: Right;  IVA TOPICAL DIABETES - oral meds  . ESOPHAGOGASTRODUODENOSCOPY (EGD) WITH PROPOFOL N/A 02/11/2016   Procedure: ESOPHAGOGASTRODUODENOSCOPY (EGD) WITH PROPOFOL;  Surgeon: Hulen Luster, MD;  Location: [facility] ENDOSCOPY;  Service: Gastroenterology;  Laterality: N/A;  . EYE SURGERY     right cataract  . GREEN LIGHT LASER TURP (TRANSURETHRAL RESECTION OF PROSTATE N/A 07/14/2017   Procedure: GREEN LIGHT LASER TURP (TRANSURETHRAL RESECTION OF PROSTATE;  Surgeon: Royston Cowper, MD;  Location: [facility] ORS;  Service: Urology;  Laterality: N/A;  . HERNIA REPAIR     inguinal  . KIDNEY STONE SURGERY    . LUMBAR LAMINECTOMY/DECOMPRESSION MICRODISCECTOMY  12/10/2011   Procedure: LUMBAR LAMINECTOMY/DECOMPRESSION MICRODISCECTOMY;  Surgeon: Ophelia Charter;  Location: [facility] NEURO ORS;  Service: Neurosurgery;;  Thoracic One-Two Laminectomy     SOCIAL HISTORY:   Social History   Tobacco Use  . Smoking status: Former Smoker    Types: Pipe    Last attempt to quit: 07/10/1987    Years since quitting: 30.7  . Smokeless tobacco: Never Used  Substance Use Topics  . Alcohol use: Yes    Comment: occ  1-2 beers a month     FAMILY HISTORY:   Family History  Problem Relation Age of Onset  . Heart Problems Mother   . Tuberculosis Father   . Anesthesia problems Neg Hx   . Hypotension Neg Hx   . Malignant hyperthermia Neg Hx   . Pseudochol deficiency Neg Hx      DRUG ALLERGIES:   Allergies  Allergen Reactions  . Ezetimibe Other (See Comments)    "Numbness and tingling"  . Statins Other (See Comments)    "Numbness and tingling"    MEDICATIONS AT HOME:   Prior to Admission medications   Medication Sig Start Date End Date Taking? Authorizing Provider  apixaban (ELIQUIS) 5 MG  TABS tablet Take 1 tablet by mouth 2 (two) times daily.    [provider]  aspirin EC 81 MG tablet Take 81 mg by mouth at bedtime.     [provider]  b complex vitamins tablet Take 1 tablet by mouth daily with lunch.     [provider]  Cholecalciferol (VITAMIN D3) 1000 units CAPS Take 1,000 Units by mouth daily with lunch.     [provider]  dicyclomine (BENTYL) 10 MG capsule Take 10 mg by mouth 4 (four) times daily as needed for spasms.     [provider]  docusate sodium (COLACE) 100 MG capsule Take 2 capsules (200 mg total) by mouth 2 (two) times daily. 07/14/17   Royston Cowper, MD  donepezil (ARICEPT) 5 MG tablet Take 5 mg by mouth every morning. 04/16/16   [provider]  glipiZIDE (GLUCOTROL XL) 2.5 MG 24 hr tablet Take 2.5 mg by mouth at bedtime.      [provider]  GLUCOSAMINE-CHONDROITIN PO Take 1 tablet by mouth 2 (two) times daily.     [provider]  hydrALAZINE (APRESOLINE) 100 MG tablet Take 100 mg by mouth 3 (three) times daily.  11/02/13   [provider]  hydrochlorothiazide (HYDRODIURIL) 25 MG tablet Take 25 mg by mouth daily.    [provider]  HYDROcodone-acetaminophen (NORCO/VICODIN) 5-325 MG per tablet Take 1 tablet by mouth 2 (two) times daily as needed for moderate pain.  10/25/13   [provider]  losartan (COZAAR) 100 MG tablet Take 100 mg by mouth every evening.     [provider]  Magnesium Oxide 250 MG TABS Take 250 mg by mouth every evening.    [provider]  Meth-Hyo-M Bl-Na Phos-Ph Sal (URO-MP) 118 MG CAPS Take 1 capsule by mouth 2 (two) times daily.    [provider]  metoprolol succinate (TOPROL-XL) 50 MG 24 hr tablet Take 50 mg by mouth daily.     [provider]  potassium chloride (K-DUR,KLOR-CON) 10 MEQ tablet Take 20 mEq by mouth 2 (two) times daily.    [provider]  sertraline (ZOLOFT) 100 MG  tablet Take 100 mg by mouth every evening.     [provider]  torsemide (DEMADEX) 20 MG tablet Take 40 mg by mouth daily.  03/02/18   [provider]    REVIEW OF SYSTEMS:  Review of Systems  Constitutional: Negative for chills, fever, malaise/fatigue and weight loss.  HENT: Negative for ear pain, hearing loss and tinnitus.   Eyes: Negative for blurred vision, double vision, pain and redness.  Respiratory: Negative for cough, hemoptysis and shortness of breath.   Cardiovascular: Negative for chest pain, palpitations, orthopnea and leg swelling.  Syncope or near syncope  Gastrointestinal: Negative for abdominal pain, constipation, diarrhea, nausea and vomiting.  Genitourinary: Negative for dysuria, frequency and hematuria.  Musculoskeletal: Negative for back pain, joint pain and neck pain.  Skin:       No acne, rash, or lesions  Neurological: Negative for dizziness, tremors, focal weakness and weakness.  Endo/Heme/Allergies: Negative for polydipsia. Does not bruise/bleed easily.  Psychiatric/Behavioral: Negative for depression. The patient is not nervous/anxious and does not have insomnia.      VITAL SIGNS:   Vitals:   03/28/18 2223 03/28/18 2224 03/28/18 2255  BP: 133/66  129/66  Pulse: 85  88  Resp: (!) 23  20  Temp: 98.6 F (37 C)    TempSrc: Oral    SpO2: 100%  100%  Weight:  102.1 kg (225 lb)   Height:  5\' 8"  (1.727 m)    Wt Readings from Last 3 Encounters:  03/28/18 102.1 kg (225 lb)  03/04/18 99.9 kg (220 lb 4 oz)  02/27/18 101.6 kg (224 lb)    PHYSICAL EXAMINATION:  Physical Exam  Vitals reviewed. Constitutional: He is oriented to person, place, and time. He appears well-developed and well-nourished. No distress.  HENT:  Head: Normocephalic and atraumatic.  Mouth/Throat: Oropharynx is clear and moist.  Eyes: Pupils are equal, round, and reactive to light. Conjunctivae and EOM are normal. No scleral icterus.  Neck: Normal range of  motion. Neck supple. No JVD present. No thyromegaly present.  Cardiovascular: Normal rate, regular rhythm and intact distal pulses. Exam reveals no gallop and no friction rub.  No murmur heard. Respiratory: Effort normal and breath sounds normal. No respiratory distress. He has no wheezes. He has no rales.  GI: Soft. Bowel sounds are normal. He exhibits no distension. There is no tenderness.  Musculoskeletal: Normal range of motion. He exhibits no edema.  No arthritis, no gout  Lymphadenopathy:    He has no cervical adenopathy.  Neurological: He is alert and oriented to person, place, and time. No cranial nerve deficit.  No dysarthria, no aphasia  Skin: Skin is warm and dry. No rash noted. No erythema.  Psychiatric: He has a normal mood and affect. His behavior is normal. Judgment and thought content normal.    LABORATORY PANEL:   CBC Recent Labs  Lab 03/28/18 2229  WBC 10.2  HGB 10.3*  HCT 33.7*  PLT 259   ------------------------------------------------------------------------------------------------------------------  Chemistries  Recent Labs  Lab 03/28/18 2229  NA 135  K 3.2*  CL 96*  CO2 30  GLUCOSE 151*  BUN 42*  CREATININE 2.01*  CALCIUM 8.8*  AST 22  ALT 15*  ALKPHOS 70  BILITOT 0.7   ------------------------------------------------------------------------------------------------------------------  Cardiac Enzymes Recent Labs  Lab 03/28/18 2229  TROPONINI <0.03   ------------------------------------------------------------------------------------------------------------------  RADIOLOGY:  No results found.  EKG:   Orders placed or performed during the hospital encounter of 03/28/18  . ED EKG  . ED EKG  . EKG 12-Lead  . EKG 12-Lead    IMPRESSION AND PLAN:  Principal Problem:   GI bleed -PPI drip tonight, n.p.o., GI consult, hold Eliquis Active Problems:   Acute renal failure superimposed on stage 3 chronic kidney disease (HCC) -avoid  nephrotoxins, gentle IV fluids tonight, monitor for expected improvement   PAF (paroxysmal atrial fibrillation) (HCC) -continue home rate controlling medication, but hold meds that may significantly lower his blood pressure, hold anticoagulation for now as above   Diabetes (HCC) -sliding scale insulin with corresponding glucose checks   GERD (gastroesophageal  reflux disease) -PPI as above  Chart review performed and case discussed with ED provider. Labs, imaging and/or ECG reviewed by provider and discussed with patient/family. Management plans discussed with the patient and/or family.  DVT PROPHYLAXIS: Mechanical only  GI PROPHYLAXIS: PPI  ADMISSION STATUS: Inpatient  CODE STATUS: DNR Code Status History    Date Active Date Inactive Code Status Order ID Comments User Context   02/10/2018 2005 02/12/2018 1526 DNR [id number]  Demetrios Loll, MD Inpatient   02/28/2016 1705 03/01/2016 2251 Full Code [id number]  Norman Herrlich, MD ED    Questions for Most Recent Historical Code Status (Order [id number])    Question Answer Comment   In the event of cardiac or respiratory ARREST Do not call a "code blue"    In the event of cardiac or respiratory ARREST Do not perform Intubation, CPR, defibrillation or ACLS    In the event of cardiac or respiratory ARREST Use medication by any route, position, wound care, and other measures to relive pain and suffering. May use oxygen, suction and manual treatment of airway obstruction as needed for comfort.       TOTAL TIME TAKING CARE OF THIS PATIENT: 45 minutes.   Raliegh Scobie FIELDING 03/29/2018, 12:58 AM  [facility]  Office  [phone]  CC: Primary care physician; [name], MD  Note:  This document was prepared using Dragon voice recognition software and may include unintentional dictation errors.

## 2018-03-29 NOTE — Care Management (Signed)
Patient admitted for GI bleed.  Patient lives at home with wife.  PCP [facility].  Patient on chronic eliquis.  Cardiology and GI have been consulted.  In February of 2019 referral was made to [facility], and [facility].  Per Alvis Lemmings case was closed last week.  PT consult pending.

## 2018-03-30 ENCOUNTER — Encounter: Payer: Self-pay | Admitting: Anesthesiology

## 2018-03-30 ENCOUNTER — Inpatient Hospital Stay: Payer: PPO | Admitting: Anesthesiology

## 2018-03-30 ENCOUNTER — Encounter
Admission: EM | Disposition: A | Discharge status: Home Health | Payer: Self-pay | Source: Home / Self Care | Attending: Internal Medicine

## 2018-03-30 DIAGNOSIS — K3189 Other diseases of stomach and duodenum: Secondary | ICD-10-CM

## 2018-03-30 DIAGNOSIS — C180 Malignant neoplasm of cecum: Principal | ICD-10-CM

## 2018-03-30 DIAGNOSIS — K644 Residual hemorrhoidal skin tags: Secondary | ICD-10-CM

## 2018-03-30 DIAGNOSIS — D124 Benign neoplasm of descending colon: Secondary | ICD-10-CM

## 2018-03-30 DIAGNOSIS — K228 Other specified diseases of esophagus: Secondary | ICD-10-CM

## 2018-03-30 DIAGNOSIS — D500 Iron deficiency anemia secondary to blood loss (chronic): Secondary | ICD-10-CM

## 2018-03-30 DIAGNOSIS — K921 Melena: Secondary | ICD-10-CM

## 2018-03-30 DIAGNOSIS — K449 Diaphragmatic hernia without obstruction or gangrene: Secondary | ICD-10-CM

## 2018-03-30 HISTORY — PX: ESOPHAGOGASTRODUODENOSCOPY: SHX5428

## 2018-03-30 HISTORY — PX: COLONOSCOPY: SHX5424

## 2018-03-30 LAB — GLUCOSE, CAPILLARY
GLUCOSE-CAPILLARY: 171 mg/dL — AB (ref 65–99)
Glucose-Capillary: 126 mg/dL — ABNORMAL HIGH (ref 65–99)
Glucose-Capillary: 135 mg/dL — ABNORMAL HIGH (ref 65–99)
Glucose-Capillary: 147 mg/dL — ABNORMAL HIGH (ref 65–99)

## 2018-03-30 LAB — BASIC METABOLIC PANEL
ANION GAP: 9 (ref 5–15)
Anion gap: 6 (ref 5–15)
BUN: 22 mg/dL — ABNORMAL HIGH (ref 6–20)
BUN: 19 mg/dL (ref 6–20)
CALCIUM: 8.7 mg/dL — AB (ref 8.9–10.3)
CHLORIDE: 106 mmol/L (ref 101–111)
CO2: 25 mmol/L (ref 22–32)
CO2: 25 mmol/L (ref 22–32)
CREATININE: 1.35 mg/dL — AB (ref 0.61–1.24)
Calcium: 9 mg/dL (ref 8.9–10.3)
Chloride: 104 mmol/L (ref 101–111)
Creatinine, Ser: 1.51 mg/dL — ABNORMAL HIGH (ref 0.61–1.24)
GFR calc Af Amer: 46 mL/min — ABNORMAL LOW (ref >60)
GFR calc non Af Amer: 46 mL/min — ABNORMAL LOW (ref >60)
GFR, EST AFRICAN AMERICAN: 53 mL/min — AB (ref >60)
GFR, EST NON AFRICAN AMERICAN: 40 mL/min — AB (ref >60)
GLUCOSE: 141 mg/dL — AB (ref 65–99)
Glucose, Bld: 145 mg/dL — ABNORMAL HIGH (ref 65–99)
POTASSIUM: 3 mmol/L — AB (ref 3.5–5.1)
Potassium: 3.7 mmol/L (ref 3.5–5.1)
SODIUM: 137 mmol/L (ref 135–145)
Sodium: 138 mmol/L (ref 135–145)

## 2018-03-30 LAB — HEMOGLOBIN: HEMOGLOBIN: 9.2 g/dL — AB (ref 13.0–18.0)

## 2018-03-30 SURGERY — EGD (ESOPHAGOGASTRODUODENOSCOPY)
Anesthesia: General

## 2018-03-30 MED ORDER — PROPOFOL 500 MG/50ML IV EMUL
INTRAVENOUS | Status: DC | PRN
  Administered 2018-03-30: 150 ug/kg/min via INTRAVENOUS

## 2018-03-30 MED ORDER — PROPOFOL 10 MG/ML IV BOLUS
INTRAVENOUS | Status: AC
  Filled 2018-03-30: qty 20

## 2018-03-30 MED ORDER — PHENYLEPHRINE HCL 10 MG/ML IJ SOLN
INTRAMUSCULAR | Status: DC | PRN
  Administered 2018-03-30: 100 ug via INTRAVENOUS

## 2018-03-30 MED ORDER — LIDOCAINE 2% (20 MG/ML) 5 ML SYRINGE
INTRAMUSCULAR | Status: DC | PRN
  Administered 2018-03-30: 100 mg via INTRAVENOUS

## 2018-03-30 MED ORDER — SODIUM CHLORIDE 0.9 % IV SOLN
510.0000 mg | Freq: Once | INTRAVENOUS | Status: AC
  Administered 2018-03-30: 510 mg via INTRAVENOUS
  Filled 2018-03-30: qty 17

## 2018-03-30 MED ORDER — PROPOFOL 10 MG/ML IV BOLUS
INTRAVENOUS | Status: DC | PRN
  Administered 2018-03-30: 50 mg via INTRAVENOUS

## 2018-03-30 MED ORDER — PANTOPRAZOLE SODIUM 40 MG PO TBEC: 40.0000 mg | DELAYED_RELEASE_TABLET | Freq: Every day | ORAL | 1 refills | Status: DC

## 2018-03-30 MED ORDER — POTASSIUM CHLORIDE CRYS ER 20 MEQ PO TBCR
40.0000 meq | EXTENDED_RELEASE_TABLET | Freq: Once | ORAL | Status: AC
  Administered 2018-03-30: 40 meq via ORAL
  Filled 2018-03-30: qty 2

## 2018-03-30 MED ORDER — ESMOLOL HCL 100 MG/10ML IV SOLN
INTRAVENOUS | Status: DC | PRN
  Administered 2018-03-30: 10 mg via INTRAVENOUS

## 2018-03-30 MED ORDER — LIDOCAINE HCL (PF) 2 % IJ SOLN
INTRAMUSCULAR | Status: AC
  Filled 2018-03-30: qty 10

## 2018-03-30 NOTE — Op Note (Signed)
[facility] Gastroenterology Patient Name: Gary Chang Procedure Date: 03/30/2018 5:04 PM MRN: [id number] Account #: [id number] Date of Birth: Mar 02, 1928 Admit Type: Inpatient Age: 84 Room: [facility] ENDO ROOM 4 Gender: Male Note Status: Finalized Procedure:            Upper GI endoscopy Indications:          Unexplained iron deficiency anemia Providers:            Lin Landsman MD, MD Referring MD:         Janine Ores. Rosanna Randy, MD (Referring MD) Medicines:            Monitored Anesthesia Care Complications:        No immediate complications. Estimated blood loss:                        Minimal. Procedure:            Pre-Anesthesia Assessment:                       - Prior to the procedure, a History and Physical was                        performed, and patient medications and allergies were                        reviewed. The patient is competent. The risks and                        benefits of the procedure and the sedation options and                        risks were discussed with the patient. All questions                        were answered and informed consent was obtained.                        Patient identification and proposed procedure were                        verified by the physician, the nurse, the                        anesthesiologist, the anesthetist and the technician in                        the pre-procedure area in the procedure room in the                        endoscopy suite. Mental Status Examination: alert and                        oriented. Airway Examination: normal oropharyngeal                        airway and neck mobility. Respiratory Examination:                        clear to auscultation. CV Examination: normal.  Prophylactic Antibiotics: The patient does not require                        prophylactic antibiotics. Prior Anticoagulants: The                        patient has taken Eliquis  (apixaban), last dose was 1                        day prior to procedure. ASA Grade Assessment: IV - A                        patient with severe systemic disease that is a constant                        threat to life. After reviewing the risks and benefits,                        the patient was deemed in satisfactory condition to                        undergo the procedure. The anesthesia plan was to use                        monitored anesthesia care (MAC). Immediately prior to                        administration of medications, the patient was                        re-assessed for adequacy to receive sedatives. The                        heart rate, respiratory rate, oxygen saturations, blood                        pressure, adequacy of pulmonary ventilation, and                        response to care were monitored throughout the                        procedure. The physical status of the patient was                        re-assessed after the procedure.                       After obtaining informed consent, the endoscope was                        passed under direct vision. Throughout the procedure,                        the patient's blood pressure, pulse, and oxygen                        saturations were monitored continuously. The Endoscope  was introduced through the mouth, and advanced to the                        second part of duodenum. The upper GI endoscopy was                        accomplished without difficulty. The patient tolerated                        the procedure fairly well. Findings:      The duodenal bulb and second portion of the duodenum were normal.      Multiple dispersed, small non-bleeding erosions were found in the       gastric antrum. There were no stigmata of recent bleeding.      A small hiatal hernia was present.      The cardia, gastric fundus and gastric body were normal. Biopsies were       taken with a cold  forceps for Helicobacter pylori testing.      The cardia and gastric fundus were normal on retroflexion.      The Z-line was irregular.      The examined esophagus was normal. Impression:           - Normal duodenal bulb and second portion of the                        duodenum.                       - Non-bleeding erosive gastropathy.                       - Small hiatal hernia.                       - Normal cardia, gastric fundus and gastric body.                        Biopsied.                       - Z-line irregular.                       - Normal esophagus. Recommendation:       - Await pathology results.                       - Use a proton pump inhibitor PO BID for 3 months. Procedure Code(s):    --- Professional ---                       [id number], Esophagogastroduodenoscopy, flexible, transoral;                        with biopsy, single or multiple Diagnosis Code(s):    --- Professional ---                       K31.89, Other diseases of stomach and duodenum                       K44.9, Diaphragmatic hernia without obstruction or  gangrene                       K22.8, Other specified diseases of esophagus                       D50.9, Iron deficiency anemia, unspecified CPT copyright 2017 American Medical Association. All rights reserved. The codes documented in this report are preliminary and upon coder review may  be revised to meet current compliance requirements. Dr. Ulyess Mort Lin Landsman MD, MD 03/30/2018 5:28:28 PM This report has been signed electronically. Number of Addenda: 0 Note Initiated On: 03/30/2018 5:04 PM      [facility]

## 2018-03-30 NOTE — Consult Note (Signed)
   [facility] CM Inpatient Consult   03/30/2018  Gary Chang 06/21/1928 [id number]    Attempted x2 to see patient this day. Patient was sleeping and then was in a procedure. Patient is currently active with [facility] for chronic disease management services.  Patient has been engaged by a [employer].  Our community based plan of care has focused on disease management and community resource support.  Patient will receive a post discharge transition of care call and will be evaluated for monthly home visits for assessments and disease process education.  Inpatient Case Manager aware that [facility] following. Of note, [facility] Care Management services does not replace or interfere with any services that are needed or arranged by inpatient case management or social work.  For additional questions or referrals please contact:  Lynise Porr RN, [facility] Liaison  [phone]) [place] [phone]) Toll free office

## 2018-03-30 NOTE — Progress Notes (Signed)
03/30/18 1541  PT Visit Information  Last PT Received On 03/30/18  Assistance Needed +1  History of Present Illness 84 y/o male here with SOB admitted with acute CHF.   Precautions  Precautions Fall  Restrictions  Weight Bearing Restrictions No  Home Living  Family/patient expects to be discharged to: Private residence  Living Arrangements Spouse/significant other  Available Help at Discharge Family;Available PRN/intermittently  Type of Home House  Home Access Stairs to enter  Entrance Stairs-Number of Steps 1  Entrance Stairs-Rails Right  Home Layout One level  Bathroom Shower/Tub Tub/shower unit;Walk-in shower;Door  Tax adviser - 4 wheels;Cane - single point  Prior Function  Level of Independence Independent with assistive device(s)  Comments Pt reports they rollator w/ household distances and the cane w/ community distances. Pt reports he has not driven for ~ 1 yr. and daughter helps w/ grocery shopping and other ADLs  Communication  Communication No difficulties  Cognition  Arousal/Alertness Awake/alert  Behavior During Therapy WFL for tasks assessed/performed  Overall Cognitive Status Within Functional Limits for tasks assessed  General Comments Pt A&O x4  Upper Extremity Assessment  RUE Deficits / Details AROM WNL strength 4/5 MMT shoulder flexion, elbow flexion/extension, supination/pronation  LUE Deficits / Details AROM WNL strength 4/5 MMT shoulder flexion, elbow flexion/extension, supination/pronation  Lower Extremity Assessment  RLE Deficits / Details AROM WNL strength 4/5 MMT hip flexion, knee flexion/extension, dorsiflexion/plantarflexion  LLE Deficits / Details AROM WNL strength 4/5 MMT hip flexion, knee flexion/extension, dorsiflexion/plantarflexion  Cervical / Trunk Assessment  Cervical / Trunk Assessment Normal  Bed Mobility  Overal bed mobility Independent  Bed Mobility Supine to Sit;Sit to Supine  Supine to sit  Independent  Sit to supine Independent  General bed mobility comments Pt was standing and ambulating from bathroom back to bed upon PT arrival. Pt then demonstrated sit to/return supine with good control and stability.  Transfers  Overall transfer level Independent  Transfers Sit to/from Stand  General transfer comment Pt demonstrated sit to stand w/ good control and stability  Ambulation/Gait  Ambulation/Gait assistance Min guard  Assistive device Rolling walker (2 wheeled)  Gait Pattern/deviations Step-through pattern  General Gait Details Pt demonstrated safe use of RW, step through pattern; CGA provided for safety but not required  Balance  Overall balance assessment Needs assistance  Sitting-balance support No upper extremity supported;Feet supported  Sitting balance-Leahy Scale Normal  Sitting balance - Comments demonstrated normal sitting balance EOB  Standing balance-Leahy Scale Good  Standing balance comment demonstrated good standing balance reaching outside of BOS for phone  PT - End of Session  Equipment Utilized During Treatment Gait belt  Activity Tolerance Patient limited by fatigue  Patient left in chair;with call bell/phone within reach;with chair alarm set (B heels elevated by heels)  Nurse Communication Mobility status  PT Assessment  PT Recommendation/Assessment Patient needs continued PT services  PT Visit Diagnosis Unsteadiness on feet (R26.81);Difficulty in walking, not elsewhere classified (R26.2);Muscle weakness (generalized) (M62.81);Other abnormalities of gait and mobility (R26.89)  PT Problem List Decreased strength;Decreased knowledge of use of DME;Decreased activity tolerance;Decreased balance;Decreased mobility;Cardiopulmonary status limiting activity;Decreased safety awareness;Decreased knowledge of precautions;Decreased coordination  PT Plan  PT Frequency (ACUTE ONLY) Min 2X/week  PT Treatment/Interventions (ACUTE ONLY) DME instruction;Gait  training;Balance training;Stair training;Functional mobility training;Therapeutic activities;Therapeutic exercise;Patient/family education  AM-PAC PT "6 Clicks" Daily Activity Outcome Measure  Difficulty turning over in bed (including adjusting bedclothes, sheets and blankets)? 4  Difficulty moving from lying on back  to sitting on the side of the bed?  4  Difficulty sitting down on and standing up from a chair with arms (e.g., wheelchair, bedside commode, etc,.)? 4  Help needed moving to and from a bed to chair (including a wheelchair)? 3  Help needed walking in hospital room? 3  Help needed climbing 3-5 steps with a railing?  3  6 Click Score 21  Mobility G Code  CJ  PT Recommendation  Follow Up Recommendations Home health PT  PT equipment Rolling walker with 5" wheels  Individuals Consulted  Consulted and Agree with Results and Recommendations Patient  Acute Rehab PT Goals  Patient Stated Goal to go home  PT Goal Formulation With patient  Time For Goal Achievement 04/13/18  Potential to Achieve Goals Good  PT Time Calculation  PT Start Time (ACUTE ONLY) 1335  PT Stop Time (ACUTE ONLY) 1405  PT Time Calculation (min) (ACUTE ONLY) 30 min  PT General Charges  $$ ACUTE PT VISIT 1 Visit  PT Evaluation  $PT Eval Low Complexity 1 Low  PT Treatments  $Gait Training 8-22 mins  Written Expression  Dominant Hand Right   Late Entry:  Clinical Impression: Pt demonstrated bed mobility and transfers (independent), ambulation 200 ft (RW CGA for safety) (see mobility section for details). Pt BP monitored throughout session 165/95 (supine; beginning of session), 168/87 (sitting; end of session); RN notified. Pt would benefit from skilled PT to progress strength, ambulation, and optimal return to PLOF. Recommend HHPT upon discharge from acute hospitalization.  [employer], SPT

## 2018-03-30 NOTE — Transfer of Care (Signed)
Immediate Anesthesia Transfer of Care Note  Patient: Gary Chang  Procedure(s) Performed: ESOPHAGOGASTRODUODENOSCOPY (EGD) (N/A ) COLONOSCOPY (N/A )  Patient Location: Endoscopy Unit  Anesthesia Type:General  Level of Consciousness: sedated  Airway & Oxygen Therapy: Patient connected to nasal cannula oxygen  Post-op Assessment: Post -op Vital signs reviewed and stable  Post vital signs: stable  Last Vitals:  Vitals Value Taken Time  BP    Temp    Pulse 113 03/30/2018  5:51 PM  Resp    SpO2 98 % 03/30/2018  5:51 PM  Vitals shown include unvalidated device data.  Last Pain:  Vitals:   03/30/18 1522  TempSrc: Tympanic  PainSc:          Complications: No apparent anesthesia complications

## 2018-03-30 NOTE — Op Note (Signed)
[facility] Gastroenterology Patient Name: Gary Chang Procedure Date: 03/30/2018 5:04 PM MRN: [id number] Account #: [id number] Date of Birth: [birth year] Admit Type: Inpatient Age: 84 Room: [facility] ENDO ROOM 4 Gender: Male Note Status: Finalized Procedure:            Colonoscopy Indications:          Unexplained iron deficiency anemia Providers:            Lin Landsman MD, MD Referring MD:         Janine Ores. Rosanna Randy, MD (Referring MD) Medicines:            Monitored Anesthesia Care Complications:        No immediate complications. Estimated blood loss:                        Minimal. Procedure:            Pre-Anesthesia Assessment:                       - Prior to the procedure, a History and Physical was                        performed, and patient medications and allergies were                        reviewed. The patient is competent. The risks and                        benefits of the procedure and the sedation options and                        risks were discussed with the patient. All questions                        were answered and informed consent was obtained.                        Patient identification and proposed procedure were                        verified by the physician, the nurse, the                        anesthesiologist, the anesthetist and the technician in                        the pre-procedure area in the procedure room in the                        endoscopy suite. Mental Status Examination: alert and                        oriented. Airway Examination: normal oropharyngeal                        airway and neck mobility. Respiratory Examination:                        clear to auscultation. CV Examination: normal.  Prophylactic Antibiotics: The patient does not require                        prophylactic antibiotics. Prior Anticoagulants: The                        patient has taken Eliquis  (apixaban), last dose was 1                        day prior to procedure. ASA Grade Assessment: IV - A                        patient with severe systemic disease that is a constant                        threat to life. After reviewing the risks and benefits,                        the patient was deemed in satisfactory condition to                        undergo the procedure. The anesthesia plan was to use                        monitored anesthesia care (MAC). Immediately prior to                        administration of medications, the patient was                        re-assessed for adequacy to receive sedatives. The                        heart rate, respiratory rate, oxygen saturations, blood                        pressure, adequacy of pulmonary ventilation, and                        response to care were monitored throughout the                        procedure. The physical status of the patient was                        re-assessed after the procedure.                       After obtaining informed consent, the colonoscope was                        passed under direct vision. Throughout the procedure,                        the patient's blood pressure, pulse, and oxygen                        saturations were monitored continuously. The  Colonoscope was introduced through the anus and                        advanced to the the cecum, identified by appendiceal                        orifice and ileocecal valve. The colonoscopy was                        performed without difficulty. The patient tolerated the                        procedure well. The quality of the bowel preparation                        was evaluated using the BBPS [facility] Bowel Preparation                        Scale) with scores of: Right Colon = 2 (minor amount of                        residual staining, small fragments of stool and/or                        opaque liquid, but  mucosa seen well), Transverse Colon                        = 2 (minor amount of residual staining, small fragments                        of stool and/or opaque liquid, but mucosa seen well)                        and Left Colon = 2 (minor amount of residual staining,                        small fragments of stool and/or opaque liquid, but                        mucosa seen well). The total BBPS score equals 6. The                        quality of the bowel preparation was fair. Findings:      The perianal and digital rectal examinations were normal. Pertinent       negatives include normal sphincter tone and no palpable rectal lesions.      A small to moderate amount of stool mixed with blood was found in the       entire colon, interfering with visualization. Lavage was performed using       sterile water, resulting in clearance with fair visualization.      A frond-like/villous and fungating non-obstructing large mass was found       in the cecum. The mass was non-circumferential. Oozing was present.       Biopsies were taken with a cold forceps for histology.      A 6 mm polyp was found in the descending colon. The polyp was sessile.       Polypectomy was not attempted.  External hemorrhoids were found during retroflexion. The hemorrhoids       were medium-sized. Impression:           - Preparation of the colon was fair.                       - Stool in the entire examined colon.                       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 Biopsied.                       - One 6 mm polyp in the descending colon. Resection not                        attempted.                       - External hemorrhoids. Recommendation:       - Return patient to hospital ward for ongoing care.                       - Resume previous diet today.                       - Continue present medications.                       - Hold eliquis                       - Await pathology results.                        - Consult colo-rectal surgeon tomorrow.                       - Consult oncologist tomorrow. Procedure Code(s):    --- Professional ---                       [phone], Colonoscopy, flexible; with biopsy, single or                        multiple Diagnosis Code(s):    --- Professional ---                       K64.4, Residual hemorrhoidal skin tags                       C18.0, Malignant neoplasm of cecum                       D12.4, Benign neoplasm of descending colon                       D50.9, Iron deficiency anemia, unspecified CPT copyright 2017 American Medical Association. All rights reserved. The codes documented in this report are preliminary and upon coder review may  be revised to meet current compliance requirements. Dr. Ulyess Mort Lin Landsman MD, MD 03/30/2018 5:52:23 PM This report has been signed electronically. Number of Addenda: 0 Note Initiated On: 03/30/2018 5:04 PM Scope Withdrawal Time: 0 hours 8 minutes 32 seconds  Total Procedure Duration: 0 hours 15 minutes  [facility]

## 2018-03-30 NOTE — Anesthesia Postprocedure Evaluation (Signed)
Anesthesia Post Note  Patient: Gary Chang  Procedure(s) Performed: ESOPHAGOGASTRODUODENOSCOPY (EGD) (N/A ) COLONOSCOPY (N/A )  Patient location during evaluation: Endoscopy Anesthesia Type: General Level of consciousness: awake and alert Pain management: pain level controlled Vital Signs Assessment: post-procedure vital signs reviewed and stable Respiratory status: spontaneous breathing, nonlabored ventilation, respiratory function stable and patient connected to nasal cannula oxygen Cardiovascular status: blood pressure returned to baseline and stable Postop Assessment: no apparent nausea or vomiting Anesthetic complications: no     Last Vitals:  Vitals:   03/30/18 1811 03/30/18 1829  BP: 111/80 129/67  Pulse: (!) 104 95  Resp: (!) 21 16  Temp:  (!) 36.4 C  SpO2: 98% 98%    Last Pain:  Vitals:   03/30/18 1801  TempSrc:   PainSc: Asleep                 Precious Haws Kosisochukwu [place]

## 2018-03-30 NOTE — Progress Notes (Signed)
MD notified of green-colored urine, no new symptoms.  No new orders; will continue to monitor.

## 2018-03-30 NOTE — Progress Notes (Addendum)
EGD and colonoscopy post procedure note  Antral erosions in stomach Cecal mass concerning for malignancy, biopsies performed Discussed findings with the patient and his wife over the telephone  Recs: Full liquid diet Do not discharge patient home today Consult oncology and colorectal surgery tomorrow Parenteral iron while inpatient  Please call GI back with questions/concerns  [name], MD [place]  [place], [place] [address]  Main: [phone]  Fax: [phone] Pager: [phone]

## 2018-03-30 NOTE — Anesthesia Post-op Follow-up Note (Signed)
Anesthesia QCDR form completed.        

## 2018-03-30 NOTE — Care Management (Signed)
PT has assessed patient and recommended home health PT.  Patient agreeable to services, and states he would like to use [facility] again.  Referral called to [facility].  RNCM requested home health orders from MD

## 2018-03-30 NOTE — Anesthesia Preprocedure Evaluation (Addendum)
Anesthesia Evaluation  Patient identified by MRN, date of birth, ID band Patient awake    Reviewed: Allergy & Precautions, H&P , NPO status , Patient's Chart, lab work & pertinent test results  History of Anesthesia Complications Negative for: history of anesthetic complications  Airway Mallampati: III  TM Distance: >3 FB Neck ROM: full    Dental  (+) Chipped, Poor Dentition, Missing   Pulmonary shortness of breath and with exertion, pneumonia, COPD, former smoker,           Cardiovascular Exercise Tolerance: Good hypertension, (-) angina+ Peripheral Vascular Disease and +CHF  (-) Past MI and (-) DOE      Neuro/Psych  Headaches, PSYCHIATRIC DISORDERS Anxiety Depression    GI/Hepatic Neg liver ROS, hiatal hernia, GERD  Medicated and Controlled,  Endo/Other  diabetes, Type 2  Renal/GU Renal disease  negative genitourinary   Musculoskeletal  (+) Arthritis ,   Abdominal   Peds  Hematology negative hematology ROS (+)   Anesthesia Other Findings Past Medical History: No date: Abdominal aortic aneurysm (AAA) ([place]) No date: Allergic state No date: Anxiety No date: Arthritis No date: Cancer ([place])     Comment:  skin ca removed No date: CHF (congestive heart failure) (HCC) No date: COPD (chronic obstructive pulmonary disease) (HCC) No date: Depression No date: Diabetes mellitus     Comment:  tingling in feet No date: Dyspnea     Comment:  with exertion No date: Enlarged prostate     Comment:  laser surgery No date: GERD (gastroesophageal reflux disease) No date: H/O hiatal hernia No date: High cholesterol No date: Hypertension     Comment:  pcp  dr Jeneen Rinks [place] No date: Kidney stones No date: Melanoma ([place]) No date: Pneumonia No date: Renal artery aneurysm [facility])  Past Surgical History: No date: ABDOMINAL AORTIC ANEURYSM REPAIR No date: BACK SURGERY     Comment:  lumbar  ,cervical  surgeries 12/11/2016: CATARACT EXTRACTION W/PHACO; Left     Comment:  Procedure: CATARACT EXTRACTION PHACO AND INTRAOCULAR               LENS PLACEMENT (IOC);  Surgeon: Leandrew Koyanagi, MD;               Location: [facility] ORS;  Service: Ophthalmology;  Laterality:              Left;  [place] 1:17 AP% 17.7 CDE 13.8 FLUID PACK LOT #               [id number] 08/26/2017: CATARACT EXTRACTION W/PHACO; Right     Comment:  Procedure: CATARACT EXTRACTION PHACO AND INTRAOCULAR               LENS PLACEMENT ([facility]) RIGHT;  Surgeon: Leandrew Koyanagi, MD;  Location: [facility];  Service:               Ophthalmology;  Laterality: Right;  IVA TOPICAL DIABETES              - oral meds 02/11/2016: ESOPHAGOGASTRODUODENOSCOPY (EGD) WITH PROPOFOL; N/A     Comment:  Procedure: ESOPHAGOGASTRODUODENOSCOPY (EGD) WITH               PROPOFOL;  Surgeon: Hulen Luster, MD;  Location: [facility]               ENDOSCOPY;  Service: Gastroenterology;  Laterality: N/A; No date: EYE SURGERY  Comment:  right cataract 07/14/2017: GREEN LIGHT LASER TURP (TRANSURETHRAL RESECTION OF  PROSTATE; N/A     Comment:  Procedure: GREEN LIGHT LASER TURP (TRANSURETHRAL               RESECTION OF PROSTATE;  Surgeon: Royston Cowper, MD;                Location: [facility] ORS;  Service: Urology;  Laterality: N/A; No date: HERNIA REPAIR     Comment:  inguinal No date: KIDNEY STONE SURGERY 12/10/2011: LUMBAR LAMINECTOMY/DECOMPRESSION MICRODISCECTOMY     Comment:  Procedure: LUMBAR LAMINECTOMY/DECOMPRESSION               MICRODISCECTOMY;  Surgeon: Ophelia Charter;  [facility];  Service: Neurosurgery;;  Thoracic One-Two               Laminectomy  BMI    Body Mass Index:  31.73 kg/m      Reproductive/Obstetrics negative OB ROS                          Anesthesia Physical Anesthesia Plan  ASA: IV  Anesthesia Plan: General   Post-op Pain Management:    Induction:  Intravenous  PONV Risk Score and Plan: 2 and Propofol infusion and TIVA  Airway Management Planned: Natural Airway and Nasal Cannula  Additional Equipment:   Intra-op Plan:   Post-operative Plan:   Informed Consent: I have reviewed the patients History and Physical, chart, labs and discussed the procedure including the risks, benefits and alternatives for the proposed anesthesia with the patient or authorized representative who has indicated his/her understanding and acceptance.   Dental Advisory Given  Plan Discussed with: CRNA  Anesthesia Plan Comments: (Plan to suspend DNR for procedure.  Patient voiced understanding.   Patient consented for risks of anesthesia including but not limited to:  - adverse reactions to medications - risk of intubation if required - damage to teeth, lips or other oral mucosa - sore throat or hoarseness - Damage to heart, brain, lungs or loss of life  Patient voiced understanding.)       Anesthesia Quick Evaluation

## 2018-03-30 NOTE — Progress Notes (Signed)
Gary Chang at [place] NAME: Gary Chang    MR#:  [id number]  DATE OF BIRTH:  1928/04/20  SUBJECTIVE:  CHIEF COMPLAINT:   Chief Complaint  Patient presents with  . Loss of Consciousness  . Weakness   The patient has no complaints..  No melena or bloody stool. REVIEW OF SYSTEMS:  Review of Systems  Constitutional: Positive for malaise/fatigue. Negative for chills and fever.  HENT: Negative for sore throat.   Eyes: Negative for blurred vision and double vision.  Respiratory: Negative for cough, hemoptysis, shortness of breath, wheezing and stridor.   Cardiovascular: Negative for chest pain, palpitations, orthopnea and leg swelling.  Gastrointestinal: Negative for abdominal pain, blood in stool, diarrhea, melena, nausea and vomiting.  Genitourinary: Negative for dysuria, flank pain and hematuria.  Musculoskeletal: Negative for back pain and joint pain.  Skin: Negative for rash.  Neurological: Negative for dizziness, sensory change, focal weakness, seizures, loss of consciousness, weakness and headaches.  Endo/Heme/Allergies: Negative for polydipsia.  Psychiatric/Behavioral: Negative for depression. The patient is not nervous/anxious.     DRUG ALLERGIES:   Allergies  Allergen Reactions  . Ezetimibe Other (See Comments)    "Numbness and tingling"  . Statins Other (See Comments)    "Numbness and tingling"   VITALS:  Blood pressure 111/80, pulse (!) 104, temperature (!) 97.1 F (36.2 C), temperature source Tympanic, resp. rate (!) 21, height 5\' 7"  (1.702 m), weight 202 lb 9.6 oz (91.9 kg), SpO2 98 %. PHYSICAL EXAMINATION:  Physical Exam  Constitutional: He is oriented to person, place, and time. He appears well-developed.  HENT:  Head: Normocephalic.  Mouth/Throat: Oropharynx is clear and moist.  Eyes: Pupils are equal, round, and reactive to light. Conjunctivae and EOM are normal. No scleral icterus.  Neck: Normal range of  motion. Neck supple. No JVD present. No tracheal deviation present.  Cardiovascular: Normal rate, regular rhythm and normal heart sounds. Exam reveals no gallop.  No murmur heard. Pulmonary/Chest: Effort normal and breath sounds normal. No respiratory distress. He has no wheezes. He has no rales.  Abdominal: Soft. Bowel sounds are normal. He exhibits no distension. There is no tenderness. There is no rebound.  Musculoskeletal: Normal range of motion. He exhibits no edema or tenderness.  Neurological: He is alert and oriented to person, place, and time. No cranial nerve deficit.  Skin: No rash noted. No erythema.   LABORATORY PANEL:  Male CBC Recent Labs  Lab 03/29/18 0319  03/30/18 0332  WBC 9.3  --   --   HGB 9.7*   < > 9.2*  HCT 32.1*  --   --   PLT 226  --   --    < > = values in this interval not displayed.   ------------------------------------------------------------------------------------------------------------------ Chemistries  Recent Labs  Lab 03/28/18 2229  03/29/18 0426  03/30/18 1223  NA 135   < > 138   < > 137  K 3.2*   < > 2.8*   < > 3.7  CL 96*   < > 101   < > 106  CO2 30   < > 30   < > 25  GLUCOSE 151*   < > 143*   < > 145*  BUN 42*   < > 37*   < > 19  CREATININE 2.01*   < > 1.69*   < > 1.35*  CALCIUM 8.8*   < > 8.4*   < > 8.7*  MG  --   --  1.9  --   --   AST 22  --   --   --   --   ALT 15*  --   --   --   --   ALKPHOS 70  --   --   --   --   BILITOT 0.7  --   --   --   --    < > = values in this interval not displayed.   RADIOLOGY:  No results found. ASSESSMENT AND PLAN:   GI bleed  The patient has been treated with PPI, hold Eliquis.  endoscopy just now showed cecum cancer. Dr. Marius Ditch suggest surgical consult for possible surgery.  Anemia of chronic disease and due to acute blood loss. Hemoglobin is a stable.    Acute renal failure superimposed on stage 3 chronic kidney disease  Avoid nephrotoxins, improved with IV fluid  support.  Hypokalemia.  Give potassium supplement and improved.  PAF (paroxysmal atrial fibrillation) Hold Lopressor and hydralazine due to low side blood pressure.  Hold torsemide, HCTZ,  and losartan due to renal failure.  Hold Eliquis due to GI bleeding.    Diabetes ([place]) -sliding scale insulin with corresponding glucose checks   GERD (gastroesophageal reflux disease) -PPI as above  Generalized weakness.  PT evaluation: HHT.  I discussed with Dr. Marius Ditch. All the records are reviewed and case discussed with Care Management/Social Worker. Management plans discussed with the patient, family and they are in agreement.  CODE STATUS: DNR  TOTAL TIME TAKING CARE OF THIS PATIENT: 33 minutes.   More than 50% of the time was spent in counseling/coordination of care: YES  POSSIBLE D/C IN 2 DAYS, DEPENDING ON CLINICAL CONDITION.   Demetrios Loll M.D on 03/30/2018 at 6:22 PM  Between 7am to 6pm - Pager - [phone]  After 6pm go to [email or web address] - [facility]

## 2018-03-31 ENCOUNTER — Other Ambulatory Visit: Payer: Self-pay | Admitting: *Deleted

## 2018-03-31 LAB — GLUCOSE, CAPILLARY
GLUCOSE-CAPILLARY: 119 mg/dL — AB (ref 65–99)
GLUCOSE-CAPILLARY: 185 mg/dL — AB (ref 65–99)

## 2018-03-31 MED ORDER — ACETAMINOPHEN 325 MG PO TABS: 325.0000 mg | ORAL_TABLET | Freq: Four times a day (QID) | ORAL | Status: DC | PRN

## 2018-03-31 MED ORDER — ADULT MULTIVITAMIN W/MINERALS CH
1.0000 | ORAL_TABLET | Freq: Every day | ORAL | Status: DC
  Administered 2018-03-31: 1 via ORAL
  Filled 2018-03-31: qty 1

## 2018-03-31 MED ORDER — ENSURE ENLIVE PO LIQD
237.0000 mL | Freq: Two times a day (BID) | ORAL | Status: DC
  Administered 2018-03-31 (×2): 237 mL via ORAL

## 2018-03-31 MED ORDER — PANTOPRAZOLE SODIUM 40 MG PO TBEC: 40.0000 mg | DELAYED_RELEASE_TABLET | Freq: Every day | ORAL | 1 refills | Status: AC

## 2018-03-31 MED ORDER — DOCUSATE SODIUM 100 MG PO CAPS: 100.0000 mg | ORAL_CAPSULE | Freq: Two times a day (BID) | ORAL | Status: DC

## 2018-03-31 MED ORDER — SODIUM CHLORIDE 0.9 % IV SOLN
100.0000 mg | Freq: Every day | INTRAVENOUS | Status: DC
  Administered 2018-03-31: 100 mg via INTRAVENOUS
  Filled 2018-03-31: qty 5

## 2018-03-31 MED ORDER — FERROUS SULFATE 325 (65 FE) MG PO TBEC: 325.0000 mg | DELAYED_RELEASE_TABLET | Freq: Two times a day (BID) | ORAL | 3 refills | Status: AC

## 2018-03-31 MED ORDER — ENSURE ENLIVE PO LIQD: 237.0000 mL | Freq: Two times a day (BID) | ORAL | 1 refills | Status: AC

## 2018-03-31 MED ORDER — ADULT MULTIVITAMIN W/MINERALS CH: 1.0000 | ORAL_TABLET | Freq: Every day | ORAL | Status: AC

## 2018-03-31 NOTE — Progress Notes (Signed)
New referral for out patient PALLIATIVE to follow at home received from [facility]. Plan is for discharge today. Patient will discharge with [place]. Patient information faxed to referral. Flo Shanks RN, BSN, [facility] and [facility] [place], hospital Liaison [phone]

## 2018-03-31 NOTE — Patient Outreach (Signed)
View in EMR pt remains in the hospital,admitted 4/7 for Syncope, GI hemorrhage, [facility] liaison notified.   RN CM to follow up at discharge.    [name].   [place] Care Management  [phone]

## 2018-03-31 NOTE — Care Management Note (Signed)
Case Management Note  Patient Details  Name: MEYER ARORA MRN: [id number] Date of Birth: 30-Mar-1928   Notified Tommi Rumps from [place] that patient to discharge today. MD has requested outpatient palliative to follow patient.  Referral called to Santiago Glad with [facility]  Subjective/Objective:                    Action/Plan:   Expected Discharge Date:  03/31/18               Expected Discharge Plan:  [place]  In-House Referral:     Discharge planning Services  CM Consult  Post Acute Care Choice:    Choice offered to:  Patient  DME Arranged:    DME Agency:     HH Arranged:  RN, PT [place] Agency:  [facility]  Status of Service:  Completed, signed off  If discussed at [place] of Stay Meetings, dates discussed:    Additional Comments:  Beverly Sessions, RN 03/31/2018, 12:06 PM

## 2018-03-31 NOTE — Consult Note (Signed)
Patient ID: Gary Chang, male   DOB: 1928-06-26, 84 y.o.   MRN: [id number]  HPI Gary Chang is a 84 y.o. male with multiple comorbidities including CHF, a fib COPD, diabetes status post open AAA repair several years ago by Dr. do resulting in the large ventral defect.  He presented couple of days to the emergency room complaining of a lower GI bleed.  The patient was on anticoagulation for his A. Fib. He underwent a colonoscopy yesterday showing evidence of a large cecal mass that was friable like the culprit for the bleeding.  No evidence of obstruction. I have personally reviewed the images from the endoscopic report showing the cecal mass.  I have also personally reviewed the CT scan from a year ago showing the large ventral hernias with loss of domain. Help acute kidney injury but is improving.  His creatinine is 1.3 this morning.  Globin 9.2  HPI  Past Medical History:  Diagnosis Date  . Abdominal aortic aneurysm (AAA) ([place])   . Allergic state   . Anxiety   . Arthritis   . Cancer ([place])    skin ca removed  . CHF (congestive heart failure) ([place])   . COPD (chronic obstructive pulmonary disease) ([place])   . Depression   . Diabetes mellitus    tingling in feet  . Dyspnea    with exertion  . Enlarged prostate    laser surgery  . GERD (gastroesophageal reflux disease)   . H/O hiatal hernia   . High cholesterol   . Hypertension    pcp  dr Jeneen Rinks [place]rick  [place]  . Kidney stones   . Melanoma ([place])   . Pneumonia   . Renal artery aneurysm [facility])     Past Surgical History:  Procedure Laterality Date  . ABDOMINAL AORTIC ANEURYSM REPAIR    . BACK SURGERY     lumbar  ,cervical surgeries  . CATARACT EXTRACTION W/PHACO Left 12/11/2016   Procedure: CATARACT EXTRACTION PHACO AND INTRAOCULAR LENS PLACEMENT (IOC);  Surgeon: Leandrew Koyanagi, MD;  Location: [facility] ORS;  Service: Ophthalmology;  Laterality: Left;  [place] 1:17 AP% 17.7 CDE 13.8 FLUID PACK LOT # [id number]  .  CATARACT EXTRACTION W/PHACO Right 08/26/2017   Procedure: CATARACT EXTRACTION PHACO AND INTRAOCULAR LENS PLACEMENT ([place]) RIGHT;  Surgeon: Leandrew Koyanagi, MD;  Location: [place];  Service: Ophthalmology;  Laterality: Right;  IVA TOPICAL DIABETES - oral meds  . ESOPHAGOGASTRODUODENOSCOPY (EGD) WITH PROPOFOL N/A 02/11/2016   Procedure: ESOPHAGOGASTRODUODENOSCOPY (EGD) WITH PROPOFOL;  Surgeon: Hulen Luster, MD;  Location: [facility] ENDOSCOPY;  Service: Gastroenterology;  Laterality: N/A;  . EYE SURGERY     right cataract  . GREEN LIGHT LASER TURP (TRANSURETHRAL RESECTION OF PROSTATE N/A 07/14/2017   Procedure: GREEN LIGHT LASER TURP (TRANSURETHRAL RESECTION OF PROSTATE;  Surgeon: Royston Cowper, MD;  Location: [facility] ORS;  Service: Urology;  Laterality: N/A;  . HERNIA REPAIR     inguinal  . KIDNEY STONE SURGERY    . LUMBAR LAMINECTOMY/DECOMPRESSION MICRODISCECTOMY  12/10/2011   Procedure: LUMBAR LAMINECTOMY/DECOMPRESSION MICRODISCECTOMY;  Surgeon: Ophelia Charter;  [facility];  Service: Neurosurgery;;  Thoracic One-Two Laminectomy    Family History  Problem Relation Age of Onset  . Heart Problems Mother   . Tuberculosis Father   . Anesthesia problems Neg Hx   . Hypotension Neg Hx   . Malignant hyperthermia Neg Hx   . Pseudochol deficiency Neg Hx     Social History Social History   Tobacco  Use  . Smoking status: Former Smoker    Types: Pipe    Last attempt to quit: 07/10/1987    Years since quitting: 30.7  . Smokeless tobacco: Never Used  Substance Use Topics  . Alcohol use: Yes    Comment: occ 1-2 beers a month  . Drug use: No    Allergies  Allergen Reactions  . Ezetimibe Other (See Comments)    "Numbness and tingling"  . Statins Other (See Comments)    "Numbness and tingling"    Current Facility-Administered Medications  Medication Dose Route Frequency Provider Last Rate Last Dose  . 0.9 %  sodium chloride infusion   Intravenous Continuous Demetrios Loll, MD 50 mL/hr at 03/30/18 1525    . acetaminophen (TYLENOL) tablet 650 mg  650 mg Oral Q6H PRN Lance Coon, MD       Or  . acetaminophen (TYLENOL) suppository 650 mg  650 mg Rectal Q6H PRN Lance Coon, MD      . feeding supplement (ENSURE ENLIVE) (ENSURE ENLIVE) liquid 237 mL  237 mL Oral BID BM Gouru, Aruna, MD   237 mL at 03/31/18 0930  . insulin aspart (novoLOG) injection 0-9 Units  0-9 Units Subcutaneous Q6H Lance Coon, MD   1 Units at 03/30/18 2359  . iron sucrose (VENOFER) 100 mg in sodium chloride 0.9 % 100 mL IVPB  100 mg Intravenous Daily Gouru, Aruna, MD      . multivitamin with minerals tablet 1 tablet  1 tablet Oral Daily Gouru, Aruna, MD   1 tablet at 03/31/18 0930  . ondansetron (ZOFRAN) tablet 4 mg  4 mg Oral Q6H PRN Lance Coon, MD       Or  . ondansetron [facility]) injection 4 mg  4 mg Intravenous Q6H PRN Lance Coon, MD      . pantoprazole (PROTONIX) injection 40 mg  40 mg Intravenous Q12H Demetrios Loll, MD   40 mg at 03/31/18 0930  . sertraline (ZOLOFT) tablet 100 mg  100 mg Oral QPM Lance Coon, MD   100 mg at 03/29/18 1840     Review of Systems Full ROS  was asked and was negative except for the information on the HPI  Physical Exam Blood pressure (!) 154/75, pulse 92, temperature 98.3 F (36.8 C), temperature source Oral, resp. rate 18, height 5\' 7"  (1.702 m), weight 95.5 kg (210 lb 8.6 oz), SpO2 99 %. CONSTITUTIONAL: Elderly male in NAD EYES: Pupils are equal, round, and reactive to light, Sclera are non-icteric. EARS, NOSE, MOUTH AND THROAT: The oropharynx is clear. The oral mucosa is pink and moist. Hearing is intact to voice. LYMPH NODES:  Lymph nodes in the neck are normal. RESPIRATORY:  Lungs are clear. There is normal respiratory effort, with equal breath sounds bilaterally, and without pathologic use of accessory muscles. CARDIOVASCULAR: Heart is regular without murmurs, gallops, or rubs. GI: The abdomen is  soft, nontender, and nondistended.Large  ventral hernia w loss of domain of abdominal wall. GU: Rectal deferred.   MUSCULOSKELETAL: Normal muscle strength and tone. No cyanosis or edema.   SKIN: Turgor is good and there are no pathologic skin lesions or ulcers. NEUROLOGIC: Motor and sensation is grossly normal. Cranial nerves are grossly intact. PSYCH:  Oriented to person, place and time. Affect is normal.  Data Reviewed I have personally reviewed the patient's imaging, laboratory findings and medical records.    Assessment/ Plan 84 year old male with multiple comorbidities including A. fib, CHF, peripheral vascular disease, massive ventral hernia with  loss of domain after AAA repair on anticoagulation presented and found to have an nonobstructive large cecal mass. Had a lengthy discussion with the patient about options.  He is obviously 89 and he will be a major challenge to perform a right hemicolectomy on a debilitated male with heart failure and loss of domain from his ventral hernia.  At this time he is comfortable not doing any surgical intervention.  He is a DNR and has opted not to do anything aggressive.  He is willing to stop the Eliquis and knows that there is a significant risk of a stroke.  This decision will ultimately need to be addressed by the primary team, I have d/w the family and  Dr Margaretmary Eddy in detail. We will be on stand by for now.   Caroleen Hamman, MD FACS General Surgeon 03/31/2018, 11:44 AM

## 2018-03-31 NOTE — Discharge Summary (Signed)
[place] [place] NAME: Gary Chang    MR#:  [id number]  DATE OF BIRTH:  07-Oct-1928  DATE OF ADMISSION:  03/28/2018 ADMITTING PHYSICIAN: Lance Coon, MD  DATE OF DISCHARGE:  03/31/18 PRIMARY CARE PHYSICIAN: [name], MD    ADMISSION DIAGNOSIS:  Orthostatic hypotension [I95.1] Gastrointestinal hemorrhage, unspecified gastrointestinal hemorrhage type [K92.2] Syncope, unspecified syncope type [R55]  DISCHARGE DIAGNOSIS:  Principal Problem:   GI bleed Active Problems:   PAF (paroxysmal atrial fibrillation) (HCC)   Diabetes (HCC)   GERD (gastroesophageal reflux disease)   Acute renal failure superimposed on stage 3 chronic kidney disease (HCC)   Iron deficiency anemia due to chronic blood loss   Hematochezia   SECONDARY DIAGNOSIS:   Past Medical History:  Diagnosis Date  . Abdominal aortic aneurysm (AAA) ([place])   . Allergic state   . Anxiety   . Arthritis   . Cancer ([place])    skin ca removed  . CHF (congestive heart failure) ([place])   . COPD (chronic obstructive pulmonary disease) ([place])   . Depression   . Diabetes mellitus    tingling in feet  . Dyspnea    with exertion  . Enlarged prostate    laser surgery  . GERD (gastroesophageal reflux disease)   . H/O hiatal hernia   . High cholesterol   . Hypertension    pcp  dr Jeneen Rinks hedrick  [place]  . Kidney stones   . Melanoma ([place])   . Pneumonia   . Renal artery aneurysm [facility])     HOSPITAL COURSE:  Kylie Gros  is a 84 y.o. male who presents with low blood pressure at home and syncope or near syncope.  Here in the ED the patient was found to have low and normal blood pressure, and was noted to be guaiac positive him a raising concern for possible GI bleed.  His hemoglobin is relatively stable but slightly decreased from recent prior values, and he has some acute on chronic renal failure    GI bleed  The patient has been treated with PPI, HELD  Eliquis.   EGD 03/30/18  Antral erosions in stomach Cecal mass concerning for malignancy, biopsies performed Patient was seen by surgery patient refused surgery and oncology consult Surgery recommended to discontinue Eliquis patient and daughter who is the healthcare power of attorney are agreeable Outpatient palliative care follow-up  Anemia of chronic disease and due to acute blood loss. Hemoglobin is a stable. Iron supplements  Acute renal failure superimposed on stage 3 chronic kidney disease  Avoid nephrotoxins, improved with IV fluid support.  Hypokalemia.  Give potassium supplement and improved.  PAF (paroxysmal atrial fibrillation)  D/CEliquis due to GI bleeding.  Diabetes ([place]) -sliding scale insulin with corresponding glucose checks  GERD (gastroesophageal reflux disease) -PPI as above  Generalized weakness.  PT evaluation: [place]  Discharge patient had a lengthy discussion with the patient, wife and daughter at bedside.  Patient refused surgery and anonymously agrees to go home   DISCHARGE CONDITIONS:   fair  CONSULTS OBTAINED:  Treatment Team:  Lin Landsman, MD Isaias Cowman, MD Florene Glen, MD Lloyd Huger, MD   PROCEDURES   EGD 03/30/18  Antral erosions in stomach Cecal mass concerning for malignancy, biopsies performed    DRUG ALLERGIES:   Allergies  Allergen Reactions  . Ezetimibe Other (See Comments)    "Numbness and tingling"  . Statins Other (See Comments)    "Numbness and  tingling"    DISCHARGE MEDICATIONS:   Allergies as of 03/31/2018      Reactions   Ezetimibe Other (See Comments)   "Numbness and tingling"   Statins Other (See Comments)   "Numbness and tingling"      Medication List    STOP taking these medications   aspirin EC 81 MG tablet   ELIQUIS 5 MG Tabs tablet Generic drug:  apixaban   hydrochlorothiazide 25 MG tablet Commonly known as:  HYDRODIURIL   HYDROcodone-acetaminophen 5-325  MG tablet Commonly known as:  NORCO/VICODIN     TAKE these medications   acetaminophen 325 MG tablet Commonly known as:  TYLENOL Take 1 tablet (325 mg total) by mouth every 6 (six) hours as needed for mild pain, fever or headache (or Fever >/= 101).   b complex vitamins tablet Take 1 tablet by mouth daily with lunch.   dicyclomine 10 MG capsule Commonly known as:  BENTYL Take 10 mg by mouth 4 (four) times daily as needed for spasms.   docusate sodium 100 MG capsule Commonly known as:  COLACE Take 2 capsules (200 mg total) by mouth 2 (two) times daily.   donepezil 5 MG tablet Commonly known as:  ARICEPT Take 5 mg by mouth every morning.   feeding supplement (ENSURE ENLIVE) Liqd Take 237 mLs by mouth 2 (two) times daily between meals.   ferrous sulfate 325 (65 FE) MG EC tablet Take 1 tablet (325 mg total) by mouth 2 (two) times daily.   glipiZIDE 2.5 MG 24 hr tablet Commonly known as:  GLUCOTROL XL Take 2.5 mg by mouth at bedtime.   GLUCOSAMINE-CHONDROITIN PO Take 1 tablet by mouth 2 (two) times daily.   hydrALAZINE 100 MG tablet Commonly known as:  APRESOLINE Take 100 mg by mouth 3 (three) times daily.   losartan 100 MG tablet Commonly known as:  COZAAR Take 100 mg by mouth every evening.   Magnesium Oxide 250 MG Tabs Take 250 mg by mouth every evening.   metoprolol succinate 50 MG 24 hr tablet Commonly known as:  TOPROL-XL Take 50 mg by mouth daily.   multivitamin with minerals Tabs tablet Take 1 tablet by mouth daily. Start taking on:  04/01/2018   pantoprazole 40 MG tablet Commonly known as:  PROTONIX Take 1 tablet (40 mg total) by mouth daily.   potassium chloride 10 MEQ tablet Commonly known as:  K-DUR,KLOR-CON Take 20 mEq by mouth 2 (two) times daily.   sertraline 100 MG tablet Commonly known as:  ZOLOFT Take 100 mg by mouth every evening.   torsemide 20 MG tablet Commonly known as:  DEMADEX Take 40 mg by mouth daily.   URO-MP 118 MG  Caps Take 1 capsule by mouth 2 (two) times daily.   Vitamin D3 1000 units Caps Take 1,000 Units by mouth daily with lunch.        DISCHARGE INSTRUCTIONS:   Follow-up with primary care physician in a week  outpatient palliative care follow-up Follow-up with Dr. Marius Ditch on 05/14/2018  DIET:  Cardiac diet  DISCHARGE CONDITION:  Fair  ACTIVITY:  Activity as tolerated  OXYGEN:  Home Oxygen: No.   Oxygen Delivery: room air  DISCHARGE LOCATION:  home   If you experience worsening of your admission symptoms, develop shortness of breath, life threatening emergency, suicidal or homicidal thoughts you must seek medical attention immediately by calling 911 or calling your MD immediately  if symptoms less severe.  You Must read complete instructions/literature along with all  the possible adverse reactions/side effects for all the Medicines you take and that have been prescribed to you. Take any new Medicines after you have completely understood and accpet all the possible adverse reactions/side effects.   Please note  You were cared for by a hospitalist during your hospital stay. If you have any questions about your discharge medications or the care you received while you were in the hospital after you are discharged, you can call the unit and asked to speak with the hospitalist on call if the hospitalist that took care of you is not available. Once you are discharged, your primary care physician will handle any further medical issues. Please note that NO REFILLS for any discharge medications will be authorized once you are discharged, as it is imperative that you return to your primary care physician (or establish a relationship with a primary care physician if you do not have one) for your aftercare needs so that they can reassess your need for medications and monitor your lab values.     Today  Chief Complaint  Patient presents with  . Loss of Consciousness  . Weakness   Patient  denies any active bleeding.  Tolerating diet.  Wants to go home.  Refusing surgery and oncology consult  ROS:  CONSTITUTIONAL: Denies fevers, chills. Denies any fatigue, weakness.  EYES: Denies blurry vision, double vision, eye pain. EARS, NOSE, THROAT: Denies tinnitus, ear pain, hearing loss. RESPIRATORY: Denies cough, wheeze, shortness of breath.  CARDIOVASCULAR: Denies chest pain, palpitations, edema.  GASTROINTESTINAL: Denies nausea, vomiting, diarrhea, abdominal pain. Denies bright red blood per rectum. GENITOURINARY: Denies dysuria, hematuria. ENDOCRINE: Denies nocturia or thyroid problems. HEMATOLOGIC AND LYMPHATIC: Denies easy bruising or bleeding. SKIN: Denies rash or lesion. MUSCULOSKELETAL: Denies pain in neck, back, shoulder, knees, hips or arthritic symptoms.  NEUROLOGIC: Denies paralysis, paresthesias.  PSYCHIATRIC: Denies anxiety or depressive symptoms.   VITAL SIGNS:  Blood pressure (!) 154/75, pulse 92, temperature 98.3 F (36.8 C), temperature source Oral, resp. rate 18, height 5\' 7"  (1.702 m), weight 95.5 kg (210 lb 8.6 oz), SpO2 99 %.  I/O:    Intake/Output Summary (Last 24 hours) at 03/31/2018 1340 Last data filed at 03/31/2018 1055 Gross per 24 hour  Intake 951 ml  Output 350 ml  Net 601 ml    PHYSICAL EXAMINATION:  GENERAL:  84 y.o.-year-old patient lying in the bed with no acute distress.  EYES: Pupils equal, round, reactive to light and accommodation. No scleral icterus. Extraocular muscles intact.  HEENT: Head atraumatic, normocephalic. Oropharynx and nasopharynx clear.  NECK:  Supple, no jugular venous distention. No thyroid enlargement, no tenderness.  LUNGS: Normal breath sounds bilaterally, no wheezing, rales,rhonchi or crepitation. No use of accessory muscles of respiration.  CARDIOVASCULAR: S1, S2 normal. No murmurs, rubs, or gallops.  ABDOMEN: Soft, non-tender, non-distended. Bowel sounds present. EXTREMITIES: No pedal edema, cyanosis, or  clubbing.  NEUROLOGIC: Cranial nerves II through XII are intact. Sensation intact. Gait not checked.  PSYCHIATRIC: The patient is alert and oriented x 3.  SKIN: No obvious rash, lesion, or ulcer.   DATA REVIEW:   CBC Recent Labs  Lab 03/29/18 0319  03/30/18 0332  WBC 9.3  --   --   HGB 9.7*   < > 9.2*  HCT 32.1*  --   --   PLT 226  --   --    < > = values in this interval not displayed.    Chemistries  Recent Labs  Lab 03/28/18 2229  03/29/18 0426  03/30/18 1223  NA 135   < > 138   < > 137  K 3.2*   < > 2.8*   < > 3.7  CL 96*   < > 101   < > 106  CO2 30   < > 30   < > 25  GLUCOSE 151*   < > 143*   < > 145*  BUN 42*   < > 37*   < > 19  CREATININE 2.01*   < > 1.69*   < > 1.35*  CALCIUM 8.8*   < > 8.4*   < > 8.7*  MG  --   --  1.9  --   --   AST 22  --   --   --   --   ALT 15*  --   --   --   --   ALKPHOS 70  --   --   --   --   BILITOT 0.7  --   --   --   --    < > = values in this interval not displayed.    Cardiac Enzymes Recent Labs  Lab 03/28/18 2229  TROPONINI <0.03    Microbiology Results  Results for orders placed or performed during the hospital encounter of 12/12/17  Culture, blood (routine x 2)     Status: None   Collection Time: 12/12/17  7:59 PM  Result Value Ref Range Status   Specimen Description BLOOD BLOOD LEFT HAND  Final   Special Requests   Final    BOTTLES DRAWN AEROBIC AND ANAEROBIC Blood Culture adequate volume   Culture   Final    NO GROWTH 5 DAYS Performed at [facility], [address] [place], [place] [address]    Report Status 12/17/2017 FINAL  Final  Culture, blood (routine x 2)     Status: None   Collection Time: 12/12/17  7:59 PM  Result Value Ref Range Status   Specimen Description BLOOD BLOOD RIGHT FOREARM  Final   Special Requests   Final    BOTTLES DRAWN AEROBIC AND ANAEROBIC Blood Culture results may not be optimal due to an excessive volume of blood received in culture bottles   Culture   Final    NO  GROWTH 5 DAYS Performed at [facility], [address], [place], [place] [address]    Report Status 12/17/2017 FINAL  Final    RADIOLOGY:  No results found.  EKG:   Orders placed or performed during the hospital encounter of 03/28/18  . ED EKG  . ED EKG  . EKG 12-Lead  . EKG 12-Lead      Management plans discussed with the patient, family and they are in agreement.  CODE STATUS:     Code Status Orders  (From admission, onward)        Start     Ordered   03/29/18 0246  Do not attempt resuscitation (DNR)  Continuous    Question Answer Comment  In the event of cardiac or respiratory ARREST Do not call a "code blue"   In the event of cardiac or respiratory ARREST Do not perform Intubation, CPR, defibrillation or ACLS   In the event of cardiac or respiratory ARREST Use medication by any route, position, wound care, and other measures to relive pain and suffering. May use oxygen, suction and manual treatment of airway obstruction as needed for comfort.      03/29/18 0246  Code Status History    Date Active Date Inactive Code Status Order ID Comments User Context   02/10/2018 2005 02/12/2018 1526 DNR [id number]  Demetrios Loll, MD Inpatient   02/28/2016 1705 03/01/2016 2251 Full Code [id number]  Norman Herrlich, MD ED    Advance Directive Documentation     Most Recent Value  Type of Advance Directive  Healthcare Power of Attorney, Living will  Pre-existing out of facility DNR order (yellow form or pink MOST form)  -  "MOST" Form in Place?  -      TOTAL TIME TAKING CARE OF THIS PATIENT: 43  minutes.   Note: This dictation was prepared with Dragon dictation along with smaller phrase technology. Any transcriptional errors that result from this process are unintentional.   @[facility] @  on 03/31/2018 at 1:40 PM  Between 7am to 6pm - Pager - [phone]  After 6pm go to [email or web address] - password [email or web address] [facility]  [facility]  Office  [phone]  CC: Primary  care physician; [name], MD

## 2018-03-31 NOTE — Discharge Instructions (Signed)
°  Follow-up with primary care physician in a week  outpatient palliative care follow-up Follow-up with Dr. Marius Ditch on 05/14/2018    Gastrointestinal Bleeding Gastrointestinal bleeding is bleeding somewhere along the path food travels through the body (digestive tract). This path is anywhere between the mouth and the opening of the butt (anus). You may have blood in your poop (stools) or have black poop. If you throw up (vomit), there may be blood in it. This condition can be mild, serious, or even life-threatening. If you have a lot of bleeding, you may need to stay in the hospital. Follow these instructions at home:  Take over-the-counter and prescription medicines only as told by your doctor.  Eat foods that have a lot of fiber in them. These foods include whole grains, fruits, and vegetables. You can also try eating 1-3 prunes each day.  Drink enough fluid to keep your pee (urine) clear or pale yellow.  Keep all follow-up visits as told by your doctor. This is important. Contact a doctor if:  Your symptoms do not get better. Get help right away if:  Your bleeding gets worse.  You feel dizzy or you pass out (faint).  You feel weak.  You have very bad cramps in your back or belly (abdomen).  You pass large clumps of blood (clots) in your poop.  Your symptoms are getting worse. This information is not intended to replace advice given to you by your health care provider. Make sure you discuss any questions you have with your health care provider. Document Released: 09/16/2008 Document Revised: 05/15/2016 Document Reviewed: 05/28/2015 Elsevier Interactive Patient Education  2018 [employer].

## 2018-04-01 ENCOUNTER — Encounter: Payer: Self-pay | Admitting: Gastroenterology

## 2018-04-01 LAB — SURGICAL PATHOLOGY

## 2018-04-02 ENCOUNTER — Other Ambulatory Visit: Payer: Self-pay | Admitting: *Deleted

## 2018-04-02 ENCOUNTER — Telehealth: Payer: Self-pay

## 2018-04-02 DIAGNOSIS — N183 Chronic kidney disease, stage 3 (moderate): Secondary | ICD-10-CM | POA: Diagnosis not present

## 2018-04-02 DIAGNOSIS — I480 Paroxysmal atrial fibrillation: Secondary | ICD-10-CM | POA: Diagnosis not present

## 2018-04-02 DIAGNOSIS — E1122 Type 2 diabetes mellitus with diabetic chronic kidney disease: Secondary | ICD-10-CM | POA: Diagnosis not present

## 2018-04-02 DIAGNOSIS — Z85828 Personal history of other malignant neoplasm of skin: Secondary | ICD-10-CM | POA: Diagnosis not present

## 2018-04-02 DIAGNOSIS — F419 Anxiety disorder, unspecified: Secondary | ICD-10-CM | POA: Diagnosis not present

## 2018-04-02 DIAGNOSIS — I130 Hypertensive heart and chronic kidney disease with heart failure and stage 1 through stage 4 chronic kidney disease, or unspecified chronic kidney disease: Secondary | ICD-10-CM | POA: Diagnosis not present

## 2018-04-02 DIAGNOSIS — F329 Major depressive disorder, single episode, unspecified: Secondary | ICD-10-CM | POA: Diagnosis not present

## 2018-04-02 DIAGNOSIS — J449 Chronic obstructive pulmonary disease, unspecified: Secondary | ICD-10-CM | POA: Diagnosis not present

## 2018-04-02 DIAGNOSIS — M1991 Primary osteoarthritis, unspecified site: Secondary | ICD-10-CM | POA: Diagnosis not present

## 2018-04-02 DIAGNOSIS — K922 Gastrointestinal hemorrhage, unspecified: Secondary | ICD-10-CM | POA: Diagnosis not present

## 2018-04-02 DIAGNOSIS — I509 Heart failure, unspecified: Secondary | ICD-10-CM | POA: Diagnosis not present

## 2018-04-02 NOTE — Telephone Encounter (Signed)
Patients wife and daughter have been informed that pathology results confirmed colon cancer. Wife stated she missed call from [facility].  I've contacted Hospice to get in touch with them.  Thanks [employer]

## 2018-04-02 NOTE — Telephone Encounter (Signed)
-----   Message from Lin Landsman, MD sent at 04/01/2018  6:16 PM EDT ----- Path confirmed that he has colon cancer. Please let pt know and he is already aware about the colon mass found on colonoscopy. This was performed as inpatient  -RV

## 2018-04-02 NOTE — Patient Outreach (Signed)
[place] [facility]) Care Management   Rande Roylance  Oct 01, 1928 [id number]  Successful telephone encounter to Gary Chang, 84 year old male, follow up on recent hospitalization April 7-10,2019 for Orthostatic Hypotension, GI hemorrhage, Syncope.  PCP office on list of completing transition of care calls.  This RN CM followed pt for transition of care/previous hospital stay February 20-22,2019 for Atrial fib, CHF (new onset).   Spoke with pt, HIPAA identifiers verified.  Pt reports doing good since  Discharge home, in the hospital they found a tumor in his intestines, not going to have surgery, keeping me comfortable.  Pt reports no pain or blood in stools.  Pt reports heart  is doing good, no chest pain or sob, weights past 4 days 214 lbs. Pt  reports he continues to be dizzy when getting up, does it slowly, no problems when sitting.  Pt reports taking all of her medications, got them refilled today,continues to  manage own medications.  Pt reports MD follow up appointments scheduled, to see one next week.    Plan:  As discussed with pt, plan to follow up in 2 weeks telephonically- check  On clinical status.    [name].   [place] Care Management  [phone]

## 2018-04-04 DIAGNOSIS — C189 Malignant neoplasm of colon, unspecified: Secondary | ICD-10-CM | POA: Insufficient documentation

## 2018-04-04 NOTE — Progress Notes (Deleted)
[place]  [phone] [phone]) [phone]  ID: Gary Chang OB: [birth year]  MR#: [id number]  XKG#:[id number]  Patient Care Team: [name], MD as PCP - General (Family Medicine) Bertram Gala as Physician Assistant (Gastroenterology) Bary Castilla, Forest Gleason, MD (General Surgery) Lyman Speller, RN as [place] Management  CHIEF COMPLAINT: Colon cancer, iron deficiency anemia  INTERVAL HISTORY: ***  REVIEW OF SYSTEMS:   ROS  As per HPI. Otherwise, a complete review of systems is negative.  PAST MEDICAL HISTORY: Past Medical History:  Diagnosis Date  . Abdominal aortic aneurysm (AAA) ([place])   . Allergic state   . Anxiety   . Arthritis   . Cancer ([place])    skin ca removed  . CHF (congestive heart failure) ([place])   . COPD (chronic obstructive pulmonary disease) ([place])   . Depression   . Diabetes mellitus    tingling in feet  . Dyspnea    with exertion  . Enlarged prostate    laser surgery  . GERD (gastroesophageal reflux disease)   . H/O hiatal hernia   . High cholesterol   . Hypertension    pcp  dr Jeneen Rinks [place]  [place]  . Kidney stones   . Melanoma ([place])   . Pneumonia   . Renal artery aneurysm ([place])     PAST SURGICAL HISTORY: Past Surgical History:  Procedure Laterality Date  . ABDOMINAL AORTIC ANEURYSM REPAIR    . BACK SURGERY     lumbar  ,cervical surgeries  . CATARACT EXTRACTION W/PHACO Left 12/11/2016   Procedure: CATARACT EXTRACTION PHACO AND INTRAOCULAR LENS PLACEMENT (IOC);  Surgeon: Leandrew Koyanagi, MD;  Location: [facility] ORS;  Service: Ophthalmology;  Laterality: Left;  [place] 1:17 AP% 17.7 CDE 13.8 FLUID PACK LOT # [id number]  . CATARACT EXTRACTION W/PHACO Right 08/26/2017   Procedure: CATARACT EXTRACTION PHACO AND INTRAOCULAR LENS PLACEMENT ([place]) RIGHT;  Surgeon: Leandrew Koyanagi, MD;  Location: [place];  Service: Ophthalmology;  Laterality: Right;  IVA  TOPICAL DIABETES - oral meds  . COLONOSCOPY N/A 03/30/2018   Procedure: COLONOSCOPY;  Surgeon: Lin Landsman, MD;  Location: [facility] ENDOSCOPY;  Service: Gastroenterology;  Laterality: N/A;  . ESOPHAGOGASTRODUODENOSCOPY N/A 03/30/2018   Procedure: ESOPHAGOGASTRODUODENOSCOPY (EGD);  Surgeon: Lin Landsman, MD;  Location[facility] ENDOSCOPY;  Service: Gastroenterology;  Laterality: N/A;  . ESOPHAGOGASTRODUODENOSCOPY (EGD) WITH PROPOFOL N/A 02/11/2016   Procedure: ESOPHAGOGASTRODUODENOSCOPY (EGD) WITH PROPOFOL;  Surgeon: Hulen Luster, MD;  Location: [facility] ENDOSCOPY;  Service: Gastroenterology;  Laterality: N/A;  . EYE SURGERY     right cataract  . GREEN LIGHT LASER TURP (TRANSURETHRAL RESECTION OF PROSTATE N/A 07/14/2017   Procedure: GREEN LIGHT LASER TURP (TRANSURETHRAL RESECTION OF PROSTATE;  Surgeon: Royston Cowper, MD;  Location: [facility] ORS;  Service: Urology;  Laterality: N/A;  . HERNIA REPAIR     inguinal  . KIDNEY STONE SURGERY    . LUMBAR LAMINECTOMY/DECOMPRESSION MICRODISCECTOMY  12/10/2011   Procedure: LUMBAR LAMINECTOMY/DECOMPRESSION MICRODISCECTOMY;  Surgeon: Ophelia Charter;  [facility] NEURO [facility]S;  Service: Neurosurgery;;  Thoracic One-Two Laminectomy    FAMILY HISTORY: Family History  Problem Relation Age of Onset  . Heart Problems Mother   . Tuberculosis Father   . Anesthesia problems Neg Hx   . Hypotension Neg Hx   . Malignant hyperthermia Neg Hx   . Pseudochol deficiency Neg Hx     ADVANCED DIRECTIVES (Y/N):  N  HEALTH MAINTENANCE: Social History   Tobacco Use  . Smoking status:  Former Smoker    Types: Pipe    Last attempt to quit: 07/10/1987    Years since quitting: 30.7  . Smokeless tobacco: Never Used  Substance Use Topics  . Alcohol use: Yes    Comment: occ 1-2 beers a month  . Drug use: No     Colonoscopy:  PAP:  Bone density:  Lipid panel:  Allergies  Allergen Reactions  . Ezetimibe Other (See Comments)    "Numbness and tingling"  . Statins  Other (See Comments)    "Numbness and tingling"    Current Outpatient Medications  Medication Sig Dispense Refill  . acetaminophen (TYLENOL) 325 MG tablet Take 1 tablet (325 mg total) by mouth every 6 (six) hours as needed for mild pain, fever or headache (or Fever >/= 101).    Marland Kitchen b complex vitamins tablet Take 1 tablet by mouth daily with lunch.     . Cholecalciferol (VITAMIN D3) 1000 units CAPS Take 1,000 Units by mouth daily with lunch.     . dicyclomine (BENTYL) 10 MG capsule Take 10 mg by mouth 4 (four) times daily as needed for spasms.     Marland Kitchen docusate sodium (COLACE) 100 MG capsule Take 2 capsules (200 mg total) by mouth 2 (two) times daily. 120 capsule 3  . donepezil (ARICEPT) 5 MG tablet Take 5 mg by mouth every morning.    . feeding supplement, ENSURE ENLIVE, (ENSURE ENLIVE) LIQD Take 237 mLs by mouth 2 (two) times daily between meals. 60 Bottle 1  . ferrous sulfate 325 (65 FE) MG EC tablet Take 1 tablet (325 mg total) by mouth 2 (two) times daily. 60 tablet 3  . glipiZIDE (GLUCOTROL XL) 2.5 MG 24 hr tablet Take 2.5 mg by mouth at bedtime.      Marland Kitchen GLUCOSAMINE-CHONDROITIN PO Take 1 tablet by mouth 2 (two) times daily.     . hydrALAZINE (APRESOLINE) 100 MG tablet Take 100 mg by mouth 3 (three) times daily.     Marland Kitchen losartan (COZAAR) 100 MG tablet Take 100 mg by mouth every evening.     . Magnesium Oxide 250 MG TABS Take 250 mg by mouth every evening.    . Meth-Hyo-M Bl-Na Phos-Ph Sal (URO-MP) 118 MG CAPS Take 1 capsule by mouth 2 (two) times daily.    . metoprolol succinate (TOPROL-XL) 50 MG 24 hr tablet Take 50 mg by mouth daily.     . Multiple Vitamin (MULTIVITAMIN WITH MINERALS) TABS tablet Take 1 tablet by mouth daily.    . pantoprazole (PROTONIX) 40 MG tablet Take 1 tablet (40 mg total) by mouth daily. 60 tablet 1  . potassium chloride (K-DUR,KLOR-CON) 10 MEQ tablet Take 20 mEq by mouth 2 (two) times daily.    . sertraline (ZOLOFT) 100 MG tablet Take 100 mg by mouth every evening.      . torsemide (DEMADEX) 20 MG tablet Take 40 mg by mouth daily.      No current facility-administered medications for this visit.     OBJECTIVE: There were no vitals filed for this visit.   There is no height or weight on file to calculate BMI.    ECOG FS:{CHL ONC [id number]  General: Well-developed, well-nourished, no acute distress. Eyes: Pink conjunctiva, anicteric sclera. HEENT: Normocephalic, moist mucous membranes, clear oropharnyx. Lungs: Clear to auscultation bilaterally. Heart: Regular rate and rhythm. No rubs, murmurs, or gallops. Abdomen: Soft, nontender, nondistended. No organomegaly noted, normoactive bowel sounds. Musculoskeletal: No edema, cyanosis, or clubbing. Neuro: Alert, answering all questions appropriately.  Cranial nerves grossly intact. Skin: No rashes or petechiae noted. Psych: Normal affect. Lymphatics: No cervical, calvicular, axillary or inguinal LAD.   LAB RESULTS:  Lab Results  Component Value Date   NA 137 03/30/2018   K 3.7 03/30/2018   CL 106 03/30/2018   CO2 25 03/30/2018   GLUCOSE 145 (H) 03/30/2018   BUN 19 03/30/2018   CREATININE 1.35 (H) 03/30/2018   CALCIUM 8.7 (L) 03/30/2018   PROT 6.5 03/28/2018   ALBUMIN 3.9 03/28/2018   AST 22 03/28/2018   ALT 15 (L) 03/28/2018   ALKPHOS 70 03/28/2018   BILITOT 0.7 03/28/2018   GFRNONAA 46 (L) 03/30/2018   GFRAA 53 (L) 03/30/2018    Lab Results  Component Value Date   WBC 9.3 03/29/2018   NEUTROABS 7.2 (H) 03/28/2018   HGB 9.2 (L) 03/30/2018   HCT 32.1 (L) 03/29/2018   MCV 68.1 (L) 03/29/2018   PLT 226 03/29/2018     STUDIES: No results found.  ASSESSMENT: Colon cancer, iron deficiency anemia  PLAN:    1. Colon cancer  2.  Iron deficiency anemia  Patient expressed understanding and was in agreement with this plan. He also understands that He can call clinic at any time with any questions, concerns, or complaints.   Cancer Staging No matching staging information was found  for the patient.  Lloyd Huger, MD   04/04/2018 11:13 PM

## 2018-04-05 ENCOUNTER — Ambulatory Visit: Payer: PPO | Admitting: Family

## 2018-04-06 ENCOUNTER — Inpatient Hospital Stay: Payer: PPO | Admitting: Oncology

## 2018-04-07 ENCOUNTER — Ambulatory Visit (INDEPENDENT_AMBULATORY_CARE_PROVIDER_SITE_OTHER): Payer: PPO | Admitting: Gastroenterology

## 2018-04-07 ENCOUNTER — Encounter: Payer: Self-pay | Admitting: Gastroenterology

## 2018-04-07 ENCOUNTER — Other Ambulatory Visit: Payer: Self-pay

## 2018-04-07 VITALS — BP 122/68 | HR 75 | Temp 97.9°F | Ht 67.0 in | Wt 216.6 lb

## 2018-04-07 DIAGNOSIS — C182 Malignant neoplasm of ascending colon: Secondary | ICD-10-CM

## 2018-04-07 NOTE — Progress Notes (Signed)
Gary Darby, MD [address]  [place]  [place], [place] [address]  Main: [phone]  Fax: [phone]    Gastroenterology Consultation  Referring Provider:     [name], MD Primary Care Physician:  [name], MD Primary Gastroenterologist:  Dr. Cephas Chang Reason for Consultation:     Colon cancer, iron deficiency anemia        HPI:   Gary Chang is a 84 y.o. male referred by Dr. [name], MD  for consultation & management of colon cancer and iron deficiency anemia. Patient was recently admitted to the hospital for 719 02/04/2018 secondary to severe symptomatic iron deficiency anemia, syncope. Underwent EGD which was unremarkable, then underwent colonoscopy which revealed cecal mass and pathology confirmed moderately differentiated adenocarcinoma. Patient was seen by the surgeon, Dr. Dahlia Byes prior to discharge and patient decided not to pursue any treatment for his cancer. He was told that it is a high risk surgery given his age, comorbidities and large ventral hernia. He is here to discuss about the colon cancer and sequelae, follow-up care. He is accompanied by his wife and his daughter who takes care of time. He denies feeling worse since discharge. He is taking iron daily. He reports feeling queasy in his stomach. He denies constipation. He denies abdominal pain, bloating, diarrhea.  NSAIDs: none  Antiplts/Anticoagulants/Anti thrombotics: stopped Effient   GI Procedures: EGD and colonoscopy EGD 03/30/2018 - Normal duodenal bulb and second portion of the duodenum. - Non-bleeding erosive gastropathy. - Small hiatal hernia. - Normal cardia, gastric fundus and gastric body. Biopsied. - Z-line irregular. - Normal esophagus.  Colonoscopy 03/30/2018 - Preparation of the colon was fair. - Stool in the entire examined colon. 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 Biopsied. - One 6 mm polyp in the descending colon. Resection not attempted. - External  hemorrhoids. DIAGNOSIS:  A. STOMACH; COLD BIOPSY:  - ANTRAL AND OXYNTIC MUCOSA WITH FOCAL REACTIVE FOVEOLAR HYPERPLASIA.  - NEGATIVE FOR ACTIVE INFLAMMATION, INTESTINAL METAPLASIA, DYSPLASIA,  AND MALIGNANCY.  - NEGATIVE FOR HELICOBACTER PYLORI IN HEMATOXYLIN AND EOSIN SECTIONS.   B. COLON MASS, CECUM; COLD BIOPSY:  - INVASIVE ADENOCARCINOMA, MODERATELY DIFFERENTIATED.   Past Medical History:  Diagnosis Date  . Abdominal aortic aneurysm (AAA) ([place])   . Allergic state   . Anxiety   . Arthritis   . Cancer ([place])    skin ca removed  . CHF (congestive heart failure) ([place])   . COPD (chronic obstructive pulmonary disease) ([place])   . Depression   . Diabetes mellitus    tingling in feet  . Dyspnea    with exertion  . Enlarged prostate    laser surgery  . GERD (gastroesophageal reflux disease)   . H/O hiatal hernia   . High cholesterol   . Hypertension    pcp  dr Jeneen Rinks [place]  [place]  . Kidney stones   . Melanoma ([place])   . Pneumonia   . Renal artery aneurysm [facility])     Past Surgical History:  Procedure Laterality Date  . ABDOMINAL AORTIC ANEURYSM REPAIR    . BACK SURGERY     lumbar  ,cervical surgeries  . CATARACT EXTRACTION W/PHACO Left 12/11/2016   Procedure: CATARACT EXTRACTION PHACO AND INTRAOCULAR LENS PLACEMENT (IOC);  Surgeon: Leandrew Koyanagi, MD;  Location: [facility] ORS;  Service: Ophthalmology;  Laterality: Left;  [place] 1:17 AP% 17.7 CDE 13.8 FLUID PACK LOT # [id number] H  . CATARACT EXTRACTION W/PHACO Right 08/26/2017   Procedure: CATARACT EXTRACTION PHACO  AND INTRAOCULAR LENS PLACEMENT ([place]) RIGHT;  Surgeon: Leandrew Koyanagi, MD;  Location: [place];  Service: Ophthalmology;  Laterality: Right;  IVA TOPICAL DIABETES - oral meds  . COLONOSCOPY N/A 03/30/2018   Procedure: COLONOSCOPY;  Surgeon: Lin Landsman, MD;  Location: [facility] ENDOSCOPY;  Service: Gastroenterology;  Laterality: N/A;  . ESOPHAGOGASTRODUODENOSCOPY N/A 03/30/2018   Procedure:  ESOPHAGOGASTRODUODENOSCOPY (EGD);  Surgeon: Lin Landsman, MD;  Location: [facility] ENDOSCOPY;  Service: Gastroenterology;  Laterality: N/A;  . ESOPHAGOGASTRODUODENOSCOPY (EGD) WITH PROPOFOL N/A 02/11/2016   Procedure: ESOPHAGOGASTRODUODENOSCOPY (EGD) WITH PROPOFOL;  Surgeon: Hulen Luster, MD;  Location[facility] ENDOSCOPY;  Service: Gastroenterology;  Laterality: N/A;  . EYE SURGERY     right cataract  . GREEN LIGHT LASER TURP (TRANSURETHRAL RESECTION OF PROSTATE N/A 07/14/2017   Procedure: GREEN LIGHT LASER TURP (TRANSURETHRAL RESECTION OF PROSTATE;  Surgeon: Royston Cowper, MD;  Location: [facility] ORS;  Service: Urology;  Laterality: N/A;  . HERNIA REPAIR     inguinal  . KIDNEY STONE SURGERY    . LUMBAR LAMINECTOMY/DECOMPRESSION MICRODISCECTOMY  12/10/2011   Procedure: LUMBAR LAMINECTOMY/DECOMPRESSION MICRODISCECTOMY;  Surgeon: Ophelia Charter;  Location: [facility] NEURO [facility];  Service: Neurosurgery;;  Thoracic One-Two Laminectomy    Current Outpatient Medications:  .  acetaminophen (TYLENOL) 325 MG tablet, Take 1 tablet (325 mg total) by mouth every 6 (six) hours as needed for mild pain, fever or headache (or Fever >/= 101)., Disp: , Rfl:  .  b complex vitamins tablet, Take 1 tablet by mouth daily with lunch. , Disp: , Rfl:  .  Cholecalciferol (VITAMIN D3) 1000 units CAPS, Take 1,000 Units by mouth daily with lunch. , Disp: , Rfl:  .  dicyclomine (BENTYL) 10 MG capsule, Take 10 mg by mouth 4 (four) times daily as needed for spasms. , Disp: , Rfl:  .  docusate sodium (COLACE) 100 MG capsule, Take 2 capsules (200 mg total) by mouth 2 (two) times daily., Disp: 120 capsule, Rfl: 3 .  donepezil (ARICEPT) 5 MG tablet, Take 5 mg by mouth every morning., Disp: , Rfl:  .  feeding supplement, ENSURE ENLIVE, (ENSURE ENLIVE) LIQD, Take 237 mLs by mouth 2 (two) times daily between meals., Disp: 60 Bottle, Rfl: 1 .  ferrous sulfate 325 (65 FE) MG EC tablet, Take 1 tablet (325 mg total) by mouth 2 (two) times daily.,  Disp: 60 tablet, Rfl: 3 .  GLUCOSAMINE-CHONDROITIN PO, Take 1 tablet by mouth 2 (two) times daily. , Disp: , Rfl:  .  hydrALAZINE (APRESOLINE) 100 MG tablet, Take 100 mg by mouth 3 (three) times daily. , Disp: , Rfl:  .  losartan (COZAAR) 100 MG tablet, Take 100 mg by mouth every evening. , Disp: , Rfl:  .  Magnesium Oxide 250 MG TABS, Take 250 mg by mouth every evening., Disp: , Rfl:  .  Meth-Hyo-M Bl-Na Phos-Ph Sal (URO-MP) 118 MG CAPS, Take 1 capsule by mouth 2 (two) times daily., Disp: , Rfl:  .  metoprolol succinate (TOPROL-XL) 50 MG 24 hr tablet, Take 50 mg by mouth daily. , Disp: , Rfl:  .  Multiple Vitamin (MULTIVITAMIN WITH MINERALS) TABS tablet, Take 1 tablet by mouth daily., Disp: , Rfl:  .  pantoprazole (PROTONIX) 40 MG tablet, Take 1 tablet (40 mg total) by mouth daily., Disp: 60 tablet, Rfl: 1 .  potassium chloride (K-DUR,KLOR-CON) 10 MEQ tablet, Take 20 mEq by mouth 2 (two) times daily., Disp: , Rfl:  .  sertraline (ZOLOFT) 100 MG tablet, Take 100  mg by mouth every evening. , Disp: , Rfl:  .  torsemide (DEMADEX) 20 MG tablet, Take 40 mg by mouth daily. , Disp: , Rfl:  .  glipiZIDE (GLUCOTROL XL) 2.5 MG 24 hr tablet, Take 2.5 mg by mouth at bedtime.  , Disp: , Rfl:    Family History  Problem Relation Age of Onset  . Heart Problems Mother   . Tuberculosis Father   . Anesthesia problems Neg Hx   . Hypotension Neg Hx   . Malignant hyperthermia Neg Hx   . Pseudochol deficiency Neg Hx      Social History   Tobacco Use  . Smoking status: Former Smoker    Types: Pipe    Last attempt to quit: 07/10/1987    Years since quitting: 30.7  . Smokeless tobacco: Never Used  Substance Use Topics  . Alcohol use: Yes    Comment: occ 1-2 beers a month  . Drug use: No    Allergies as of 04/07/2018 - Review Complete 04/07/2018  Allergen Reaction Noted  . Ezetimibe Other (See Comments) 05/07/2016  . Statins Other (See Comments) 12/08/2011    Review of Systems:    All systems  reviewed and negative except where noted in HPI.   Physical Exam:  BP 122/68   Pulse 75   Temp 97.9 F (36.6 C) (Oral)   Ht 5\' 7"  (1.702 m)   Wt 216 lb 9.6 oz (98.2 kg)   BMI 33.92 kg/m  No LMP for male patient.  General:   Alert,  Well-developed, well-nourished, pleasant and cooperative in NAD Head:  Normocephalic and atraumatic. Eyes:  Sclera clear, no icterus.   Conjunctiva pink. Ears: decreased auditory acuity in his right ear. Nose:  No deformity, discharge, or lesions. Mouth:  No deformity or lesions,oropharynx pink & moist. Neck:  Supple; no masses or thyromegaly. Lungs:  Respirations even and unlabored.  Clear throughout to auscultation.   No wheezes, crackles, or rhonchi. No acute distress. Heart:  Regular rate and rhythm; no murmurs, clicks, rubs, or gallops. Abdomen:  Normal bowel sounds. Soft, non-tender and large ventral hernia without hepatosplenomegaly. No guarding or rebound tenderness.   Rectal: Not performed Msk:  Symmetrical without gross deformities. Good, equal movement & strength bilaterally. Pulses:  Normal pulses noted. Extremities:  No clubbing or edema.  No cyanosis. Neurologic:  Alert and oriented x3 Psych:  Alert and cooperative. Normal mood and affect.  Imaging Studies: reviewed  Assessment and Plan:   Gary Chang is a 84 y.o. 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 I discussed in depth with patient's family about the implications of undergoing further evaluation for staging which will decide the treatment options either surgery alone or chemotherapy and surgery. I advised them to seek a second opinion by the colorectal surgery group at [facility] and see an oncologist at [facility], for an informed decision making. However, patient  is adamant not to undergo any kind of treatment at this time as in last he is not suffering from pain. I told him that he will continue to have blood loss from the tumor resulting in iron deficiency and worsening of anemia. He can continue taking iron and have hemoglobin checked regularly by his primary care doctor. Watch for symptoms of anemia. I told him that the tumor with close slowly  and may lead to bowel obstruction. I recommended him to keep his bowels regular and take stool softener as needed to avoid constipation. I have addressed patient's and his family's all questions and concerns.   Follow up as needed   Gary Darby, MD

## 2018-04-14 ENCOUNTER — Ambulatory Visit: Payer: PPO | Admitting: Gastroenterology

## 2018-04-14 NOTE — Progress Notes (Signed)
Gary Chang  [phone] [phone]  ID: Gary Chang OB: 19-Nov-1928  MR#: [id number]  AOZ#:[id number]  Patient Care Team: [name], MD as PCP - General (Family Medicine) Bertram Gala as Physician Assistant (Gastroenterology) Bary Castilla, Forest Gleason, MD (General Surgery) Lyman Speller, RN as [place] Management  CHIEF COMPLAINT: Colon cancer, iron deficiency anemia  INTERVAL HISTORY: Patient is an 84 year old male who was recently admitted to the hospital and found to have significant iron deficiency anemia secondary to biopsy-proven colon cancer.  Given patient's advanced age and underlying dementia, he and his family elected not to pursue any treatment including surgery for his colon cancer.  Currently he feels well.  He has no neurologic complaints.  He denies any recent fevers.  He has a fair appetite, but denies weight loss.  He has no chest pain or shortness of breath.  He denies any nausea, vomiting, constipation, or diarrhea.  He denies any further hematochezia, but admits to occasional melena.  He has no urinary complaints.  Patient offers no further specific complaints today.  REVIEW OF SYSTEMS:   Review of Systems  Constitutional: Negative.  Negative for fever, malaise/fatigue and weight loss.  Respiratory: Negative.  Negative for cough, hemoptysis and shortness of breath.   Cardiovascular: Negative.  Negative for chest pain and leg swelling.  Gastrointestinal: Positive for melena. Negative for abdominal pain, blood in stool, constipation and diarrhea.  Genitourinary: Negative.  Negative for hematuria.  Musculoskeletal: Negative.   Skin: Negative.  Negative for rash.  Neurological: Negative.  Negative for sensory change, focal weakness and weakness.  Psychiatric/Behavioral: Negative.  The patient is not nervous/anxious.     As per HPI. Otherwise, a complete review of systems is negative.  PAST  MEDICAL HISTORY: Past Medical History:  Diagnosis Date  . Abdominal aortic aneurysm (AAA) ([place])   . Allergic state   . Anxiety   . Arthritis   . Cancer ([place])    skin ca removed  . CHF (congestive heart failure) ([place])   . COPD (chronic obstructive pulmonary disease) ([place])   . Depression   . Diabetes mellitus    tingling in feet  . Dyspnea    with exertion  . Enlarged prostate    laser surgery  . GERD (gastroesophageal reflux disease)   . H/O hiatal hernia   . High cholesterol   . Hypertension    pcp  dr Jeneen Rinks [place]  [place]  . Kidney stones   . Melanoma ([place])   . Pneumonia   . Renal artery aneurysm ([place])     PAST SURGICAL HISTORY: Past Surgical History:  Procedure Laterality Date  . ABDOMINAL AORTIC ANEURYSM REPAIR    . BACK SURGERY     lumbar  ,cervical surgeries  . CATARACT EXTRACTION W/PHACO Left 12/11/2016   Procedure: CATARACT EXTRACTION PHACO AND INTRAOCULAR LENS PLACEMENT (IOC);  Surgeon: Leandrew Koyanagi, MD;  Location: [facility] ORS;  Service: Ophthalmology;  Laterality: Left;  [place] 1:17 AP% 17.7 CDE 13.8 FLUID PACK LOT # [id number]  . CATARACT EXTRACTION W/PHACO Right 08/26/2017   Procedure: CATARACT EXTRACTION PHACO AND INTRAOCULAR LENS PLACEMENT ([place]) RIGHT;  Surgeon: Leandrew Koyanagi, MD;  Location: [facility];  Service: Ophthalmology;  Laterality: Right;  IVA TOPICAL DIABETES - oral meds  . COLONOSCOPY N/A 03/30/2018   Procedure: COLONOSCOPY;  Surgeon: Lin Landsman, MD;  Location: [facility] ENDOSCOPY;  Service: Gastroenterology;  Laterality: N/A;  . ESOPHAGOGASTRODUODENOSCOPY N/A 03/30/2018   Procedure: ESOPHAGOGASTRODUODENOSCOPY (EGD);  Surgeon: Lin Landsman, MD;  Location: [facility] ENDOSCOPY;  Service: Gastroenterology;  Laterality: N/A;  . ESOPHAGOGASTRODUODENOSCOPY (EGD) WITH PROPOFOL N/A 02/11/2016   Procedure: ESOPHAGOGASTRODUODENOSCOPY (EGD) WITH PROPOFOL;  Surgeon: Hulen Luster, MD;  Location: [facility] ENDOSCOPY;  Service: Gastroenterology;   Laterality: N/A;  . EYE SURGERY     right cataract  . GREEN LIGHT LASER TURP (TRANSURETHRAL RESECTION OF PROSTATE N/A 07/14/2017   Procedure: GREEN LIGHT LASER TURP (TRANSURETHRAL RESECTION OF PROSTATE;  Surgeon: Royston Cowper, MD;  Location: [facility] ORS;  Service: Urology;  Laterality: N/A;  . HERNIA REPAIR     inguinal  . KIDNEY STONE SURGERY    . LUMBAR LAMINECTOMY/DECOMPRESSION MICRODISCECTOMY  12/10/2011   Procedure: LUMBAR LAMINECTOMY/DECOMPRESSION MICRODISCECTOMY;  Surgeon: Ophelia Charter;  Location: [facility] NEURO ORS;  Service: Neurosurgery;;  Thoracic One-Two Laminectomy    FAMILY HISTORY: Family History  Problem Relation Age of Onset  . Heart Problems Mother   . Tuberculosis Father   . Heart Problems Sister   . Aortic aneurysm Brother   . Heart attack Brother   . Heart Problems Maternal Grandmother   . Heart Problems Brother   . Heart Problems Brother   . Anesthesia problems Neg Hx   . Hypotension Neg Hx   . Malignant hyperthermia Neg Hx   . Pseudochol deficiency Neg Hx     ADVANCED DIRECTIVES (Y/N):  N  HEALTH MAINTENANCE: Social History   Tobacco Use  . Smoking status: Former Smoker    Years: 20.00    Types: Pipe    Last attempt to quit: 07/10/1975    Years since quitting: 42.8  . Smokeless tobacco: Never Used  Substance Use Topics  . Alcohol use: Not Currently  . Drug use: No     Colonoscopy:  PAP:  Bone density:  Lipid panel:  Allergies  Allergen Reactions  . Ezetimibe Other (See Comments)    "Numbness and tingling"  . Statins Other (See Comments)    "Numbness and tingling"    Current Outpatient Medications  Medication Sig Dispense Refill  . b complex vitamins tablet Take 1 tablet by mouth daily with lunch.     . Cholecalciferol (VITAMIN D3) 1000 units CAPS Take 1,000 Units by mouth daily with lunch.     . docusate sodium (COLACE) 100 MG capsule Take 2 capsules (200 mg total) by mouth 2 (two) times daily. 120 capsule 3  . donepezil (ARICEPT) 5  MG tablet Take 5 mg by mouth every morning.    . feeding supplement, ENSURE ENLIVE, (ENSURE ENLIVE) LIQD Take 237 mLs by mouth 2 (two) times daily between meals. 60 Bottle 1  . ferrous sulfate 325 (65 FE) MG EC tablet Take 1 tablet (325 mg total) by mouth 2 (two) times daily. 60 tablet 3  . glipiZIDE (GLUCOTROL XL) 2.5 MG 24 hr tablet Take 2.5 mg by mouth at bedtime.      Marland Kitchen GLUCOSAMINE-CHONDROITIN PO Take 1 tablet by mouth 2 (two) times daily.     . hydrALAZINE (APRESOLINE) 100 MG tablet Take 100 mg by mouth 3 (three) times daily.     . hydrochlorothiazide (HYDRODIURIL) 25 MG tablet TAKE ONE TABLET EVERY DAY    . losartan (COZAAR) 100 MG tablet Take 100 mg by mouth every evening.     . Magnesium Oxide 250 MG TABS Take 250 mg by mouth every evening.    . Meth-Hyo-M Bl-Na Phos-Ph Sal (URO-MP) 118 MG CAPS Take 1 capsule by mouth 2 (two) times daily.    Marland Kitchen  metoprolol succinate (TOPROL-XL) 50 MG 24 hr tablet Take 50 mg by mouth daily.     . Multiple Vitamin (MULTIVITAMIN WITH MINERALS) TABS tablet Take 1 tablet by mouth daily.    . pantoprazole (PROTONIX) 40 MG tablet Take 1 tablet (40 mg total) by mouth daily. 60 tablet 1  . potassium chloride (K-DUR,KLOR-CON) 10 MEQ tablet Take 20 mEq by mouth 2 (two) times daily.    . sertraline (ZOLOFT) 100 MG tablet Take 100 mg by mouth every evening.     . torsemide (DEMADEX) 20 MG tablet Take 40 mg by mouth daily.     Marland Kitchen acetaminophen (TYLENOL) 325 MG tablet Take 1 tablet (325 mg total) by mouth every 6 (six) hours as needed for mild pain, fever or headache (or Fever >/= 101). (Patient not taking: Reported on 04/15/2018)    . dicyclomine (BENTYL) 10 MG capsule Take 10 mg by mouth 4 (four) times daily as needed for spasms.     Marland Kitchen HYDROcodone-acetaminophen (NORCO/VICODIN) 5-325 MG tablet Take by mouth.     No current facility-administered medications for this visit.     OBJECTIVE: Vitals:   04/15/18 1502  BP: 132/75  Pulse: (!) 102  Resp: 20  Temp: (!) 96.6  F (35.9 C)     Body mass index is 31.57 kg/m.    ECOG FS:1 - Symptomatic but completely ambulatory  General: Well-developed, well-nourished, no acute distress. Eyes: Pink conjunctiva, anicteric sclera. HEENT: Normocephalic, moist mucous membranes, clear oropharnyx. Lungs: Clear to auscultation bilaterally. Heart: Regular rate and rhythm. No rubs, murmurs, or gallops. Abdomen: Soft, nontender, nondistended. No organomegaly noted, normoactive bowel sounds. Musculoskeletal: No edema, cyanosis, or clubbing. Neuro: Alert, answering all questions appropriately. Cranial nerves grossly intact. Skin: No rashes or petechiae noted. Psych: Normal affect. Lymphatics: No cervical, calvicular, axillary or inguinal LAD.   LAB RESULTS:  Lab Results  Component Value Date   NA 137 03/30/2018   K 3.7 03/30/2018   CL 106 03/30/2018   CO2 25 03/30/2018   GLUCOSE 145 (H) 03/30/2018   BUN 19 03/30/2018   CREATININE 1.35 (H) 03/30/2018   CALCIUM 8.7 (L) 03/30/2018   PROT 6.5 03/28/2018   ALBUMIN 3.9 03/28/2018   AST 22 03/28/2018   ALT 15 (L) 03/28/2018   ALKPHOS 70 03/28/2018   BILITOT 0.7 03/28/2018   GFRNONAA 46 (L) 03/30/2018   GFRAA 53 (L) 03/30/2018    Lab Results  Component Value Date   WBC 9.3 03/29/2018   NEUTROABS 7.2 (H) 03/28/2018   HGB 9.2 (L) 03/30/2018   HCT 32.1 (L) 03/29/2018   MCV 68.1 (L) 03/29/2018   PLT 226 03/29/2018   Lab Results  Component Value Date   IRON 21 (L) 03/29/2018   TIBC 418 03/29/2018   IRONPCTSAT 5 (L) 03/29/2018   Lab Results  Component Value Date   FERRITIN 16 (L) 03/29/2018      STUDIES: No results found.  ASSESSMENT: Colon cancer, iron deficiency anemia  PLAN:    1. Colon cancer: Biopsy-proven on recent colonoscopy.  Patient was evaluated by surgery and elected not to pursue any surgical intervention given his advanced age and underlying dementia.  He does not desire any further treatment or work-up for this.  We discussed at  length possibilities as he has disease  Progression including possible obstruction requiring emergent surgery.  Patient also is not ready for hospice at this time. 2.  Iron deficiency anemia: Secondary to underlying colon cancer.  Patient's hemoglobin and iron stores are  significantly reduced.  Patient reports he does take oral iron supplementation.  Return to clinic in 1 and 2 weeks to receive 510 mg IV Feraheme.  Patient will then return to clinic in 2 months for repeat laboratory work, further evaluation, and consideration of additional iron.  Approximately 60 minutes was spent in discussion of which greater than 50% was consultation.  Patient expressed understanding and was in agreement with this plan. He also understands that He can call clinic at any time with any questions, concerns, or complaints.   Cancer Staging No matching staging information was found for the patient.  Lloyd Huger, MD   04/17/2018 5:23 PM

## 2018-04-15 ENCOUNTER — Other Ambulatory Visit: Payer: Self-pay

## 2018-04-15 ENCOUNTER — Inpatient Hospital Stay: Payer: PPO | Attending: Oncology | Admitting: Oncology

## 2018-04-15 ENCOUNTER — Encounter: Payer: Self-pay | Admitting: Oncology

## 2018-04-15 VITALS — BP 132/75 | HR 102 | Temp 96.6°F | Resp 20 | Ht 68.5 in | Wt 210.7 lb

## 2018-04-15 DIAGNOSIS — F0390 Unspecified dementia without behavioral disturbance: Secondary | ICD-10-CM | POA: Insufficient documentation

## 2018-04-15 DIAGNOSIS — D509 Iron deficiency anemia, unspecified: Secondary | ICD-10-CM | POA: Insufficient documentation

## 2018-04-15 DIAGNOSIS — Z79899 Other long term (current) drug therapy: Secondary | ICD-10-CM | POA: Diagnosis not present

## 2018-04-15 DIAGNOSIS — D500 Iron deficiency anemia secondary to blood loss (chronic): Secondary | ICD-10-CM

## 2018-04-15 DIAGNOSIS — C189 Malignant neoplasm of colon, unspecified: Secondary | ICD-10-CM | POA: Insufficient documentation

## 2018-04-15 DIAGNOSIS — Z87891 Personal history of nicotine dependence: Secondary | ICD-10-CM | POA: Diagnosis not present

## 2018-04-15 NOTE — Progress Notes (Signed)
Here for new pt evaluation . Age related dementia dx per daughter who is here w pt and his wife.

## 2018-04-16 ENCOUNTER — Ambulatory Visit: Payer: Self-pay | Admitting: *Deleted

## 2018-04-16 ENCOUNTER — Other Ambulatory Visit: Payer: Self-pay | Admitting: *Deleted

## 2018-04-16 DIAGNOSIS — I130 Hypertensive heart and chronic kidney disease with heart failure and stage 1 through stage 4 chronic kidney disease, or unspecified chronic kidney disease: Secondary | ICD-10-CM | POA: Diagnosis not present

## 2018-04-16 DIAGNOSIS — I509 Heart failure, unspecified: Secondary | ICD-10-CM | POA: Diagnosis not present

## 2018-04-16 DIAGNOSIS — E1122 Type 2 diabetes mellitus with diabetic chronic kidney disease: Secondary | ICD-10-CM | POA: Diagnosis not present

## 2018-04-16 DIAGNOSIS — N183 Chronic kidney disease, stage 3 (moderate): Secondary | ICD-10-CM | POA: Diagnosis not present

## 2018-04-16 DIAGNOSIS — K922 Gastrointestinal hemorrhage, unspecified: Secondary | ICD-10-CM | POA: Diagnosis not present

## 2018-04-16 NOTE — Patient Outreach (Signed)
[facility]) Care Management  04/16/2018  DESHAUN WEISINGER [birth year] [id number]   Unsuccessful telephone encounter to [name], 84 year old male - follow up On current clinical status.   Pt's recent hospitalization April 7-10,2019 for Orthostatic  Hypotension, GI hemorrhage, Syncope.  During inpatient stay colonoscopy revealed  Cecal mass,pathology confirmed colon cancer.  RN CM followed pt for chronic  Disease management of HF.  HIPAA compliant voice message left with RN CM's Contact information, request to return call.   Plan: if no response, unsuccessful outreach letter to be sent to pt and if no  Response in 10 business days- to close case.   RN CM to make next outreach  Call in 3 business days.    [name].   [place] Care Management  [phone]

## 2018-04-19 ENCOUNTER — Other Ambulatory Visit: Payer: Self-pay

## 2018-04-21 ENCOUNTER — Ambulatory Visit: Payer: Self-pay | Admitting: *Deleted

## 2018-04-21 ENCOUNTER — Other Ambulatory Visit: Payer: Self-pay | Admitting: *Deleted

## 2018-04-21 DIAGNOSIS — E1122 Type 2 diabetes mellitus with diabetic chronic kidney disease: Secondary | ICD-10-CM | POA: Diagnosis not present

## 2018-04-21 DIAGNOSIS — N183 Chronic kidney disease, stage 3 (moderate): Secondary | ICD-10-CM | POA: Diagnosis not present

## 2018-04-21 DIAGNOSIS — I509 Heart failure, unspecified: Secondary | ICD-10-CM | POA: Diagnosis not present

## 2018-04-21 DIAGNOSIS — K922 Gastrointestinal hemorrhage, unspecified: Secondary | ICD-10-CM | POA: Diagnosis not present

## 2018-04-21 DIAGNOSIS — I130 Hypertensive heart and chronic kidney disease with heart failure and stage 1 through stage 4 chronic kidney disease, or unspecified chronic kidney disease: Secondary | ICD-10-CM | POA: Diagnosis not present

## 2018-04-21 NOTE — Patient Outreach (Signed)
[facility]) Care Management  04/21/2018  Gary Chang January 15, 1928 [id number]   Second unsuccessful telephone encounter to Gary Chang, 84 year old male.  Pt had a Recent hospitalization April 7-10,2019 for Orthostatic Hypotension, GI hemorrhage, Syncope. During inpatient stay colonoscopy revealed cecal mass, pathology confirmed colon cancer.  PCP on list for doing transition of care call, RN CM followed pt for chronic disease Management of HF.   HIPAA compliant voice message left with RN CM's contact information, Request to return call.   Plan:  If no response to voice message left, plan to make third call in 4 business days.   Unsuccessful outreach letter sent 4/26- no response in 10 business days to close case  And notify PCP.  [name].   [place] Care Management  [phone]

## 2018-04-22 ENCOUNTER — Inpatient Hospital Stay: Payer: PPO

## 2018-04-22 DIAGNOSIS — C184 Malignant neoplasm of transverse colon: Secondary | ICD-10-CM | POA: Diagnosis not present

## 2018-04-22 DIAGNOSIS — D509 Iron deficiency anemia, unspecified: Secondary | ICD-10-CM | POA: Diagnosis not present

## 2018-04-26 ENCOUNTER — Other Ambulatory Visit: Payer: Self-pay | Admitting: *Deleted

## 2018-04-26 DIAGNOSIS — I5032 Chronic diastolic (congestive) heart failure: Secondary | ICD-10-CM | POA: Diagnosis not present

## 2018-04-26 DIAGNOSIS — R42 Dizziness and giddiness: Secondary | ICD-10-CM | POA: Diagnosis not present

## 2018-04-26 DIAGNOSIS — I10 Essential (primary) hypertension: Secondary | ICD-10-CM | POA: Diagnosis not present

## 2018-04-26 DIAGNOSIS — I481 Persistent atrial fibrillation: Secondary | ICD-10-CM | POA: Diagnosis not present

## 2018-04-26 NOTE — Patient Outreach (Signed)
[facility]) Care Management  04/26/2018  Gary Chang 23-Oct-1928 [id number]  Successful follow up call  to Gary Chang  In response to  message left- received letter From RN CM.   Spoke with pt, HIPAA identifiers verified, discussed reason for letter sent was  Unable to contact him.  Pt reports HF not bad, sob at times, no chest pain, weights staying  Between 205-210 lbs the last 30 days, no edema or 2-3 lbs weight gain in a day or 5 lbs in  A week.  Pt reports has cancer end of his intestines, not ready for Hospice, currently no Pain or bleeding,feeling okay maybe he will beat this.   Pt reports taking all of his medications, talked with PCP last week.  RN CM discussed with pt plan to discharge from [facility], no further case management needs to which pt agreed.  Provided pt with [facility]'s  Main office number to call if needs arise in the future, pt has RN CM's contact information.   Plan:  As discussed with pt, plan to discharge from [facility] CM services.            Plan to inform PCP of discharge, send case closure letter.   [name].   [place] Care Management  [phone]

## 2018-04-27 ENCOUNTER — Ambulatory Visit: Payer: Self-pay | Admitting: *Deleted

## 2018-04-29 ENCOUNTER — Ambulatory Visit: Payer: PPO

## 2018-05-04 DIAGNOSIS — R42 Dizziness and giddiness: Secondary | ICD-10-CM | POA: Diagnosis not present

## 2018-05-04 DIAGNOSIS — H6121 Impacted cerumen, right ear: Secondary | ICD-10-CM | POA: Diagnosis not present

## 2018-05-04 DIAGNOSIS — R05 Cough: Secondary | ICD-10-CM | POA: Diagnosis not present

## 2018-05-04 DIAGNOSIS — H9191 Unspecified hearing loss, right ear: Secondary | ICD-10-CM | POA: Diagnosis not present

## 2018-05-31 DIAGNOSIS — L570 Actinic keratosis: Secondary | ICD-10-CM | POA: Diagnosis not present

## 2018-05-31 DIAGNOSIS — C189 Malignant neoplasm of colon, unspecified: Secondary | ICD-10-CM | POA: Diagnosis not present

## 2018-05-31 DIAGNOSIS — Z85820 Personal history of malignant melanoma of skin: Secondary | ICD-10-CM | POA: Diagnosis not present

## 2018-05-31 DIAGNOSIS — Z85828 Personal history of other malignant neoplasm of skin: Secondary | ICD-10-CM | POA: Diagnosis not present

## 2018-05-31 DIAGNOSIS — L820 Inflamed seborrheic keratosis: Secondary | ICD-10-CM | POA: Diagnosis not present

## 2018-05-31 DIAGNOSIS — I509 Heart failure, unspecified: Secondary | ICD-10-CM | POA: Diagnosis not present

## 2018-05-31 DIAGNOSIS — L821 Other seborrheic keratosis: Secondary | ICD-10-CM | POA: Diagnosis not present

## 2018-06-07 DIAGNOSIS — R351 Nocturia: Secondary | ICD-10-CM | POA: Diagnosis not present

## 2018-06-07 DIAGNOSIS — N401 Enlarged prostate with lower urinary tract symptoms: Secondary | ICD-10-CM | POA: Diagnosis not present

## 2018-06-07 DIAGNOSIS — R350 Frequency of micturition: Secondary | ICD-10-CM | POA: Diagnosis not present

## 2018-06-14 DIAGNOSIS — R351 Nocturia: Secondary | ICD-10-CM | POA: Diagnosis not present

## 2018-06-14 DIAGNOSIS — N401 Enlarged prostate with lower urinary tract symptoms: Secondary | ICD-10-CM | POA: Diagnosis not present

## 2018-06-14 DIAGNOSIS — R350 Frequency of micturition: Secondary | ICD-10-CM | POA: Diagnosis not present

## 2018-06-17 ENCOUNTER — Ambulatory Visit: Payer: PPO

## 2018-06-17 ENCOUNTER — Other Ambulatory Visit: Payer: PPO

## 2018-06-17 ENCOUNTER — Ambulatory Visit: Payer: PPO | Admitting: Oncology

## 2018-07-13 DIAGNOSIS — N401 Enlarged prostate with lower urinary tract symptoms: Secondary | ICD-10-CM | POA: Diagnosis not present

## 2018-07-19 DIAGNOSIS — N3281 Overactive bladder: Secondary | ICD-10-CM | POA: Diagnosis not present

## 2018-07-19 DIAGNOSIS — R351 Nocturia: Secondary | ICD-10-CM | POA: Diagnosis not present

## 2018-07-19 DIAGNOSIS — N401 Enlarged prostate with lower urinary tract symptoms: Secondary | ICD-10-CM | POA: Diagnosis not present

## 2018-08-02 DIAGNOSIS — N401 Enlarged prostate with lower urinary tract symptoms: Secondary | ICD-10-CM | POA: Diagnosis not present

## 2018-08-02 DIAGNOSIS — Z125 Encounter for screening for malignant neoplasm of prostate: Secondary | ICD-10-CM | POA: Diagnosis not present

## 2018-08-02 DIAGNOSIS — R351 Nocturia: Secondary | ICD-10-CM | POA: Diagnosis not present

## 2018-08-10 DIAGNOSIS — I10 Essential (primary) hypertension: Secondary | ICD-10-CM | POA: Diagnosis not present

## 2018-08-10 DIAGNOSIS — R413 Other amnesia: Secondary | ICD-10-CM | POA: Diagnosis not present

## 2018-08-10 DIAGNOSIS — R109 Unspecified abdominal pain: Secondary | ICD-10-CM | POA: Diagnosis not present

## 2018-08-10 DIAGNOSIS — G8929 Other chronic pain: Secondary | ICD-10-CM | POA: Diagnosis not present

## 2018-08-10 DIAGNOSIS — N183 Chronic kidney disease, stage 3 (moderate): Secondary | ICD-10-CM | POA: Diagnosis not present

## 2018-08-10 DIAGNOSIS — E119 Type 2 diabetes mellitus without complications: Secondary | ICD-10-CM | POA: Diagnosis not present

## 2018-09-30 DIAGNOSIS — L918 Other hypertrophic disorders of the skin: Secondary | ICD-10-CM | POA: Diagnosis not present

## 2018-09-30 DIAGNOSIS — L821 Other seborrheic keratosis: Secondary | ICD-10-CM | POA: Diagnosis not present

## 2018-09-30 DIAGNOSIS — C189 Malignant neoplasm of colon, unspecified: Secondary | ICD-10-CM | POA: Diagnosis not present

## 2018-09-30 DIAGNOSIS — Z1283 Encounter for screening for malignant neoplasm of skin: Secondary | ICD-10-CM | POA: Diagnosis not present

## 2018-09-30 DIAGNOSIS — Z85828 Personal history of other malignant neoplasm of skin: Secondary | ICD-10-CM | POA: Diagnosis not present

## 2018-09-30 DIAGNOSIS — D225 Melanocytic nevi of trunk: Secondary | ICD-10-CM | POA: Diagnosis not present

## 2018-09-30 DIAGNOSIS — D1800 Hemangioma unspecified site: Secondary | ICD-10-CM | POA: Diagnosis not present

## 2018-09-30 DIAGNOSIS — Z85820 Personal history of malignant melanoma of skin: Secondary | ICD-10-CM | POA: Diagnosis not present

## 2018-09-30 DIAGNOSIS — C44519 Basal cell carcinoma of skin of other part of trunk: Secondary | ICD-10-CM | POA: Diagnosis not present

## 2018-09-30 DIAGNOSIS — D2260 Melanocytic nevi of unspecified upper limb, including shoulder: Secondary | ICD-10-CM | POA: Diagnosis not present

## 2018-09-30 DIAGNOSIS — L578 Other skin changes due to chronic exposure to nonionizing radiation: Secondary | ICD-10-CM | POA: Diagnosis not present

## 2018-09-30 DIAGNOSIS — L570 Actinic keratosis: Secondary | ICD-10-CM | POA: Diagnosis not present

## 2018-11-22 ENCOUNTER — Other Ambulatory Visit: Payer: PPO | Admitting: Student

## 2018-11-22 DIAGNOSIS — I480 Paroxysmal atrial fibrillation: Secondary | ICD-10-CM

## 2018-11-22 DIAGNOSIS — C189 Malignant neoplasm of colon, unspecified: Secondary | ICD-10-CM | POA: Diagnosis not present

## 2018-11-22 DIAGNOSIS — I5032 Chronic diastolic (congestive) heart failure: Secondary | ICD-10-CM | POA: Diagnosis not present

## 2018-11-22 NOTE — Progress Notes (Addendum)
INITIAL PALLIATIVE CARE CONSULT VISIT   PATIENT NAME: Gary Chang DOB: [birth year] MRN: [id number]  PRIMARY CARE PROVIDER: [name], MD  REFERRING PROVIDER: [name], MD [address] [facility], [place] [address]  RESPONSIBLE PARTY:  Acct ID - Guarantor Home Phone Work Phone Relationship Acct Type  [id number] - Gary [phone]  Self P/F     [address], [place], [place] [address]   BILLABLE ICD-10:  Z51.5 PRIMARY DECISION MAKER: PATIENT , HCPOA PRIMARY CAREGIVER: self    RECOMMENDATIONS and PLAN:  Gary Chang is a 84 y.o. year old male admitted with diagnosis of Diastolic Heart Failure, Paroxsymal Atrial Fibrillation, Colon Cancer. Palliative Care referral received from Dr. Kary Chang and has been contacted regarding referral and requests the team focus on Goals of Care.  Patient is to follow up with Cardiologist, Dr. Nehemiah Chang due to recent weight gain and dyspnea.   I spent 60 minutes providing this consultation,  from 3:00PM  to 4:00PM. More than 50% of the time in this consultation was spent coordinating communication.   Patient is to follow up with Cardiologist, Dr. Nehemiah Chang due to recent weight gain, dyspnea and history of Atrial Fibrillation.   Code status not addressed this visit; this will be an ongoing conversation.   Introduced Hospice conversation if he continues to decline due to his Heart Failure and cecal mass.    HISTORY OF PRESENT ILLNESS:  Gary Chang is alert and oriented x 3. He and wife endorse patient becoming more forgetful. He also reports feeling weaker, has shortness of breath with minimal exertion. He uses rollator walker for ambulation. Daughter Gary Chang states he is getting to point where he needs more assistance with ADL's. He declines therapy. He weighs himself daily; was 205 pounds one month ago, yesterday he was 218 pounds, although he reports a decline in his appetite. He reports blood sugars being  "normal" when he checks them; daughter Gary Chang states they are not over 150mg /dL. He reports sleeping more throughout the day. He reports dizziness at times. He reports abdominal pain that is severe at times; takes hydrocodone-acetaminophen with effectiveness. He opted against further work-up of cecal mass that was found 03/2018. Patient stopped driving one year ago; daughter provides transportation. Daughter concerned that patient does not always take medications as scheduled due to sleeping in late.    CODE STATUS: Not addressed this visit. ADVANCED DIRECTIVES: N  PPS: 60% HOSPICE ELIGIBILITY/DIAGNOSIS: Unknown  PAST MEDICAL HISTORY:  Past Medical History:  Diagnosis Date  . Abdominal aortic aneurysm (AAA) ([place])   . Allergic state   . Anxiety   . Arthritis   . Cancer ([place])    skin ca removed  . CHF (congestive heart failure) ([place])   . COPD (chronic obstructive pulmonary disease) ([place])   . Depression   . Diabetes mellitus    tingling in feet  . Dyspnea    with exertion  . Enlarged prostate    laser surgery  . GERD (gastroesophageal reflux disease)   . H/O hiatal hernia   . High cholesterol   . Hypertension    pcp  dr Jeneen Rinks [place]  [place]  . Kidney stones   . Melanoma ([place])   . Pneumonia   . Renal artery aneurysm ([facility])     SOCIAL HX:  Social History   Tobacco Use  . Smoking status: Former Smoker    Years: 20.00    Types: Pipe    Last attempt to quit: 07/10/1975  Years since quitting: 43.4  . Smokeless tobacco: Never Used  Substance Use Topics  . Alcohol use: Not Currently    ALLERGIES:  Allergies  Allergen Reactions  . Ezetimibe Other (See Comments)    "Numbness and tingling"  . Statins Other (See Comments)    "Numbness and tingling"     REVIEWED: Yes PERTINENT MEDICATIONS:  Outpatient Encounter Medications as of 11/22/2018  Medication Sig  . feeding supplement, ENSURE ENLIVE, (ENSURE ENLIVE) LIQD Take 237 mLs by mouth 2 (two) times daily between meals.   . hydrALAZINE (APRESOLINE) 100 MG tablet Take 100 mg by mouth 3 (three) times daily.   Marland Kitchen HYDROcodone-acetaminophen (NORCO) 7.5-325 MG tablet Take 1 tablet by mouth 2 (two) times daily as needed.   Marland Kitchen losartan (COZAAR) 100 MG tablet Take 100 mg by mouth every evening.   . magnesium oxide (MAG-OX) 400 MG tablet Take 400 mg by mouth every evening.   . metoprolol succinate (TOPROL-XL) 100 MG 24 hr tablet Take 100 mg by mouth daily.   . Multiple Vitamin (MULTIVITAMIN WITH MINERALS) TABS tablet Take 1 tablet by mouth daily.  . pantoprazole (PROTONIX) 40 MG tablet Take 1 tablet (40 mg total) by mouth daily.  . potassium chloride (K-DUR,KLOR-CON) 10 MEQ tablet Take 20 mEq by mouth daily. Two tabs daily  . sertraline (ZOLOFT) 100 MG tablet Take 100 mg by mouth every evening.   . torsemide (DEMADEX) 20 MG tablet Take 40 mg by mouth daily.   Marland Kitchen acetaminophen (TYLENOL) 325 MG tablet Take 1 tablet (325 mg total) by mouth every 6 (six) hours as needed for mild pain, fever or headache (or Fever >/= 101). (Patient not taking: Reported on 04/15/2018)  . b complex vitamins tablet Take 1 tablet by mouth daily with lunch.   . Cholecalciferol (VITAMIN D3) 1000 units CAPS Take 1,000 Units by mouth daily with lunch.   . dicyclomine (BENTYL) 10 MG capsule Take 10 mg by mouth 4 (four) times daily as needed for spasms.   Marland Kitchen docusate sodium (COLACE) 100 MG capsule Take 2 capsules (200 mg total) by mouth 2 (two) times daily.  Marland Kitchen donepezil (ARICEPT) 5 MG tablet Take 5 mg by mouth every morning.  . ferrous sulfate 325 (65 FE) MG EC tablet Take 1 tablet (325 mg total) by mouth 2 (two) times daily. (Patient not taking: Reported on 11/22/2018)  . glipiZIDE (GLUCOTROL XL) 2.5 MG 24 hr tablet Take 2.5 mg by mouth at bedtime.    Marland Kitchen GLUCOSAMINE-CHONDROITIN PO Take 1 tablet by mouth 2 (two) times daily.   . hydrochlorothiazide (HYDRODIURIL) 25 MG tablet TAKE ONE TABLET EVERY DAY  . Meth-Hyo-M Bl-Na Phos-Ph Sal (URO-MP) 118 MG CAPS Take 1  capsule by mouth 2 (two) times daily.   No facility-administered encounter medications on file as of 11/22/2018.    REVIEWED MEDICATIONS: Yes  PHYSICAL EXAM:   GEN: Frail appearing, thin LUNGS: clear to auscultation  CARDIAC: Irregular heart rate, No murmurs  ABD: abnormal findings:  bowel sounds positive; abdominal hernia present. EXTREMITIES: negative SKIN: Skin color, texture, turgor normal. No rashes or lesions  NEURO: negative  LAST [place], NP

## 2018-11-25 ENCOUNTER — Telehealth: Payer: Self-pay | Admitting: Student

## 2018-11-25 NOTE — Telephone Encounter (Signed)
11/24/18 at 3:20pm.NP phone call: NP called Dr. Alveria Apley office regarding patient having increasing weight gain and worsening dyspnea. NP discussed patient needing appointment; staff is to call patient to schedule appointment. Awaiting return call from nurse.

## 2018-11-26 DIAGNOSIS — I5032 Chronic diastolic (congestive) heart failure: Secondary | ICD-10-CM | POA: Diagnosis not present

## 2018-11-26 DIAGNOSIS — I10 Essential (primary) hypertension: Secondary | ICD-10-CM | POA: Diagnosis not present

## 2018-11-26 DIAGNOSIS — R000 Tachycardia, unspecified: Secondary | ICD-10-CM | POA: Diagnosis not present

## 2018-11-26 DIAGNOSIS — I4819 Other persistent atrial fibrillation: Secondary | ICD-10-CM | POA: Diagnosis not present

## 2018-11-26 DIAGNOSIS — R0602 Shortness of breath: Secondary | ICD-10-CM | POA: Diagnosis not present

## 2018-11-27 ENCOUNTER — Emergency Department
Admission: EM | Admit: 2018-11-27 | Discharge: 2018-11-28 | Disposition: A | Discharge status: Home / Self Care | Payer: PPO | Source: Home / Self Care | Attending: Emergency Medicine | Admitting: Emergency Medicine

## 2018-11-27 ENCOUNTER — Encounter: Payer: Self-pay | Admitting: Emergency Medicine

## 2018-11-27 ENCOUNTER — Other Ambulatory Visit: Payer: Self-pay

## 2018-11-27 DIAGNOSIS — R52 Pain, unspecified: Secondary | ICD-10-CM | POA: Diagnosis not present

## 2018-11-27 DIAGNOSIS — J9601 Acute respiratory failure with hypoxia: Secondary | ICD-10-CM | POA: Diagnosis not present

## 2018-11-27 DIAGNOSIS — Z7984 Long term (current) use of oral hypoglycemic drugs: Secondary | ICD-10-CM | POA: Insufficient documentation

## 2018-11-27 DIAGNOSIS — M1990 Unspecified osteoarthritis, unspecified site: Secondary | ICD-10-CM | POA: Diagnosis not present

## 2018-11-27 DIAGNOSIS — R0602 Shortness of breath: Secondary | ICD-10-CM | POA: Insufficient documentation

## 2018-11-27 DIAGNOSIS — E1140 Type 2 diabetes mellitus with diabetic neuropathy, unspecified: Secondary | ICD-10-CM | POA: Insufficient documentation

## 2018-11-27 DIAGNOSIS — E1122 Type 2 diabetes mellitus with diabetic chronic kidney disease: Secondary | ICD-10-CM | POA: Insufficient documentation

## 2018-11-27 DIAGNOSIS — I130 Hypertensive heart and chronic kidney disease with heart failure and stage 1 through stage 4 chronic kidney disease, or unspecified chronic kidney disease: Secondary | ICD-10-CM | POA: Insufficient documentation

## 2018-11-27 DIAGNOSIS — C189 Malignant neoplasm of colon, unspecified: Secondary | ICD-10-CM

## 2018-11-27 DIAGNOSIS — I480 Paroxysmal atrial fibrillation: Secondary | ICD-10-CM

## 2018-11-27 DIAGNOSIS — J90 Pleural effusion, not elsewhere classified: Secondary | ICD-10-CM

## 2018-11-27 DIAGNOSIS — R262 Difficulty in walking, not elsewhere classified: Secondary | ICD-10-CM | POA: Diagnosis not present

## 2018-11-27 DIAGNOSIS — I482 Chronic atrial fibrillation, unspecified: Secondary | ICD-10-CM | POA: Diagnosis not present

## 2018-11-27 DIAGNOSIS — J9811 Atelectasis: Secondary | ICD-10-CM | POA: Diagnosis not present

## 2018-11-27 DIAGNOSIS — Z961 Presence of intraocular lens: Secondary | ICD-10-CM | POA: Diagnosis not present

## 2018-11-27 DIAGNOSIS — J449 Chronic obstructive pulmonary disease, unspecified: Secondary | ICD-10-CM | POA: Diagnosis not present

## 2018-11-27 DIAGNOSIS — D631 Anemia in chronic kidney disease: Secondary | ICD-10-CM | POA: Diagnosis not present

## 2018-11-27 DIAGNOSIS — R7989 Other specified abnormal findings of blood chemistry: Secondary | ICD-10-CM | POA: Diagnosis not present

## 2018-11-27 DIAGNOSIS — K5900 Constipation, unspecified: Secondary | ICD-10-CM | POA: Insufficient documentation

## 2018-11-27 DIAGNOSIS — Z87442 Personal history of urinary calculi: Secondary | ICD-10-CM | POA: Diagnosis not present

## 2018-11-27 DIAGNOSIS — Z87891 Personal history of nicotine dependence: Secondary | ICD-10-CM

## 2018-11-27 DIAGNOSIS — E7800 Pure hypercholesterolemia, unspecified: Secondary | ICD-10-CM | POA: Diagnosis not present

## 2018-11-27 DIAGNOSIS — I509 Heart failure, unspecified: Secondary | ICD-10-CM | POA: Diagnosis not present

## 2018-11-27 DIAGNOSIS — Z23 Encounter for immunization: Secondary | ICD-10-CM | POA: Diagnosis not present

## 2018-11-27 DIAGNOSIS — F419 Anxiety disorder, unspecified: Secondary | ICD-10-CM | POA: Diagnosis not present

## 2018-11-27 DIAGNOSIS — N400 Enlarged prostate without lower urinary tract symptoms: Secondary | ICD-10-CM | POA: Diagnosis not present

## 2018-11-27 DIAGNOSIS — R1084 Generalized abdominal pain: Secondary | ICD-10-CM | POA: Diagnosis not present

## 2018-11-27 DIAGNOSIS — N183 Chronic kidney disease, stage 3 (moderate): Secondary | ICD-10-CM | POA: Insufficient documentation

## 2018-11-27 DIAGNOSIS — Z8249 Family history of ischemic heart disease and other diseases of the circulatory system: Secondary | ICD-10-CM | POA: Diagnosis not present

## 2018-11-27 DIAGNOSIS — R000 Tachycardia, unspecified: Secondary | ICD-10-CM | POA: Diagnosis not present

## 2018-11-27 DIAGNOSIS — Z9842 Cataract extraction status, left eye: Secondary | ICD-10-CM | POA: Diagnosis not present

## 2018-11-27 DIAGNOSIS — I5032 Chronic diastolic (congestive) heart failure: Secondary | ICD-10-CM

## 2018-11-27 DIAGNOSIS — Z9841 Cataract extraction status, right eye: Secondary | ICD-10-CM | POA: Diagnosis not present

## 2018-11-27 DIAGNOSIS — I110 Hypertensive heart disease with heart failure: Secondary | ICD-10-CM | POA: Diagnosis not present

## 2018-11-27 DIAGNOSIS — F329 Major depressive disorder, single episode, unspecified: Secondary | ICD-10-CM | POA: Diagnosis not present

## 2018-11-27 DIAGNOSIS — R778 Other specified abnormalities of plasma proteins: Secondary | ICD-10-CM | POA: Diagnosis not present

## 2018-11-27 DIAGNOSIS — Z66 Do not resuscitate: Secondary | ICD-10-CM | POA: Diagnosis not present

## 2018-11-27 DIAGNOSIS — K439 Ventral hernia without obstruction or gangrene: Secondary | ICD-10-CM | POA: Diagnosis not present

## 2018-11-27 DIAGNOSIS — K219 Gastro-esophageal reflux disease without esophagitis: Secondary | ICD-10-CM | POA: Diagnosis not present

## 2018-11-27 DIAGNOSIS — I10 Essential (primary) hypertension: Secondary | ICD-10-CM | POA: Diagnosis not present

## 2018-11-27 DIAGNOSIS — I5023 Acute on chronic systolic (congestive) heart failure: Secondary | ICD-10-CM | POA: Diagnosis not present

## 2018-11-27 NOTE — ED Triage Notes (Signed)
Pt arrives from home with c/o constipation since 630pm tonight; pt says he has not a normal bowel movement since yesterday; EMS says pt was using accessory muscles to breathe-sats 97% on room air with clear lungs to auscultation; pt says he has taken stool softeners and drank a bottle of magnesium citrate tonight with no relief; pt reports hard stool;

## 2018-11-28 ENCOUNTER — Emergency Department: Payer: PPO

## 2018-11-28 LAB — COMPREHENSIVE METABOLIC PANEL
ALT: 80 U/L — ABNORMAL HIGH (ref 0–44)
ANION GAP: 13 (ref 5–15)
AST: 56 U/L — ABNORMAL HIGH (ref 15–41)
Albumin: 3.5 g/dL (ref 3.5–5.0)
Alkaline Phosphatase: 88 U/L (ref 38–126)
BUN: 31 mg/dL — ABNORMAL HIGH (ref 8–23)
CO2: 25 mmol/L (ref 22–32)
Calcium: 8.5 mg/dL — ABNORMAL LOW (ref 8.9–10.3)
Chloride: 98 mmol/L (ref 98–111)
Creatinine, Ser: 1.67 mg/dL — ABNORMAL HIGH (ref 0.61–1.24)
GFR calc Af Amer: 42 mL/min — ABNORMAL LOW (ref >60)
GFR calc non Af Amer: 36 mL/min — ABNORMAL LOW (ref >60)
Glucose, Bld: 200 mg/dL — ABNORMAL HIGH (ref 70–99)
Potassium: 3.6 mmol/L (ref 3.5–5.1)
Sodium: 136 mmol/L (ref 135–145)
Total Bilirubin: 1.1 mg/dL (ref 0.3–1.2)
Total Protein: 6.1 g/dL — ABNORMAL LOW (ref 6.5–8.1)

## 2018-11-28 LAB — LIPASE, BLOOD: Lipase: 63 U/L — ABNORMAL HIGH (ref 11–51)

## 2018-11-28 LAB — CBC
HCT: 27.7 % — ABNORMAL LOW (ref 39.0–52.0)
HEMOGLOBIN: 8.1 g/dL — AB (ref 13.0–17.0)
MCH: 23.4 pg — ABNORMAL LOW (ref 26.0–34.0)
MCHC: 29.2 g/dL — ABNORMAL LOW (ref 30.0–36.0)
MCV: 80.1 fL (ref 80.0–100.0)
Platelets: 278 10*3/uL (ref 150–400)
RBC: 3.46 MIL/uL — ABNORMAL LOW (ref 4.22–5.81)
RDW: 16.4 % — ABNORMAL HIGH (ref 11.5–15.5)
WBC: 12.6 10*3/uL — ABNORMAL HIGH (ref 4.0–10.5)
nRBC: 0.2 % (ref 0.0–0.2)

## 2018-11-28 LAB — BRAIN NATRIURETIC PEPTIDE: B Natriuretic Peptide: 375 pg/mL — ABNORMAL HIGH (ref 0.0–100.0)

## 2018-11-28 LAB — TROPONIN I: Troponin I: 0.04 ng/mL (ref <0.03)

## 2018-11-28 MED ORDER — IOPAMIDOL (ISOVUE-300) INJECTION 61%
30.0000 mL | Freq: Once | INTRAVENOUS | Status: AC | PRN
  Administered 2018-11-28: 30 mL via ORAL

## 2018-11-28 MED ORDER — IOHEXOL 300 MG/ML  SOLN
80.0000 mL | Freq: Once | INTRAMUSCULAR | Status: AC | PRN
  Administered 2018-11-28: 100 mL via INTRAVENOUS

## 2018-11-28 MED ORDER — SODIUM CHLORIDE 0.9 % IV BOLUS: 500.0000 mL | Freq: Once | INTRAVENOUS | Status: DC

## 2018-11-28 NOTE — ED Notes (Signed)
Dr Karma Greaser at bedside to follow up

## 2018-11-28 NOTE — ED Provider Notes (Signed)
[facility] Emergency Department Provider Note  ____________________________________________   First MD Initiated Contact with Patient 11/27/18 2358     (approximate)  I have reviewed the triage vital signs and the nursing notes.   HISTORY  Chief Complaint Constipation    HPI Gary Chang is a 84 y.o. male with extensive chronic medical issues including colon cancer for which she has decided not to pursue treatment.  He presents tonight by EMS for evaluation of constipation.  He reports that he has not been able to have a bowel movement since yesterday.  He feels uncomfortable and distended.  He has taken stool softeners and drink a bottle of magnesium citrate but it did not help.  He had some hard stool yesterday.  He denies any pain, just the discomfort and distention associated with needing to have a bowel movement and being unable to do so.  He reports shortness of breath but says that this is normal for him and his daughter reports that he has not been taking his medications including his torsemide.  He denies fever/chills, chest pain, nausea, vomiting, and abdominal pain.  No recent dysuria.  He reports that the constipation symptoms are severe and nothing in nuclear is making them better or worse.    Past Medical History:  Diagnosis Date  . Abdominal aortic aneurysm (AAA) ([place])   . Allergic state   . Anxiety   . Arthritis   . Cancer ([place])    skin ca removed  . CHF (congestive heart failure) ([place])   . COPD (chronic obstructive pulmonary disease) ([place])   . Depression   . Diabetes mellitus    tingling in feet  . Dyspnea    with exertion  . Enlarged prostate    laser surgery  . GERD (gastroesophageal reflux disease)   . H/O hiatal hernia   . High cholesterol   . Hypertension    pcp  dr Jeneen Rinks [place]  [place]  . Kidney stones   . Melanoma ([place])   . Pneumonia   . Renal artery aneurysm [facility])     Patient Active Problem List   Diagnosis Date Noted  . Colon cancer ([place]) 04/04/2018  . Iron deficiency anemia due to chronic blood loss   . Hematochezia   . GI bleed 03/29/2018  . GERD (gastroesophageal reflux disease) 03/29/2018  . COPD (chronic obstructive pulmonary disease) ([place]) 03/29/2018  . Acute renal failure superimposed on stage 3 chronic kidney disease ([place]) 03/29/2018  . Chronic diastolic heart failure ([place]) 03/04/2018  . PAF (paroxysmal atrial fibrillation) ([place]) 03/04/2018  . Diabetes ([place]) 03/04/2018  . Abdominal pain 01/30/2017  . Ventral hernia without obstruction or gangrene 01/30/2017  . Tension headache 04/30/2016  . Atypical facial pain 04/30/2016  . Left-sided weakness   . Occlusion and stenosis of basilar artery   . Vertebral artery stenosis   . Basilar artery stenosis 02/28/2016  . Dizziness and giddiness 11/29/2013  . Abnormality of gait 11/29/2013  . Diabetic neuropathy ([place]) 11/29/2013  . Memory loss 11/29/2013  . Headache, paroxysmal hemicrania, episodic 11/29/2013    Past Surgical History:  Procedure Laterality Date  . ABDOMINAL AORTIC ANEURYSM REPAIR    . BACK SURGERY     lumbar  ,cervical surgeries  . CATARACT EXTRACTION W/PHACO Left 12/11/2016   Procedure: CATARACT EXTRACTION PHACO AND INTRAOCULAR LENS PLACEMENT (IOC);  Surgeon: Leandrew Koyanagi, MD;  Location: [facility] ORS;  Service: Ophthalmology;  Laterality: Left;  [place] 1:17 AP% 17.7 CDE 13.8 FLUID PACK  LOT # [id number]  . CATARACT EXTRACTION W/PHACO Right 08/26/2017   Procedure: CATARACT EXTRACTION PHACO AND INTRAOCULAR LENS PLACEMENT ([place]) RIGHT;  Surgeon: Leandrew Koyanagi, MD;  Location: [place];  Service: Ophthalmology;  Laterality: Right;  IVA TOPICAL DIABETES - oral meds  . COLONOSCOPY N/A 03/30/2018   Procedure: COLONOSCOPY;  Surgeon: Lin Landsman, MD;  Location: [facility] ENDOSCOPY;  Service: Gastroenterology;  Laterality: N/A;  . ESOPHAGOGASTRODUODENOSCOPY N/A 03/30/2018   Procedure:  ESOPHAGOGASTRODUODENOSCOPY (EGD);  Surgeon: Lin Landsman, MD;  Location: [facility] ENDOSCOPY;  Service: Gastroenterology;  Laterality: N/A;  . ESOPHAGOGASTRODUODENOSCOPY (EGD) WITH PROPOFOL N/A 02/11/2016   Procedure: ESOPHAGOGASTRODUODENOSCOPY (EGD) WITH PROPOFOL;  Surgeon: Hulen Luster, MD;  Location: [facility] ENDOSCOPY;  Service: Gastroenterology;  Laterality: N/A;  . EYE SURGERY     right cataract  . GREEN LIGHT LASER TURP (TRANSURETHRAL RESECTION OF PROSTATE N/A 07/14/2017   Procedure: GREEN LIGHT LASER TURP (TRANSURETHRAL RESECTION OF PROSTATE;  Surgeon: Royston Cowper, MD;  Location: [facility] ORS;  Service: Urology;  Laterality: N/A;  . HERNIA REPAIR     inguinal  . KIDNEY STONE SURGERY    . LUMBAR LAMINECTOMY/DECOMPRESSION MICRODISCECTOMY  12/10/2011   Procedure: LUMBAR LAMINECTOMY/DECOMPRESSION MICRODISCECTOMY;  Surgeon: Ophelia Charter;  Location: [facility] NEURO ORS;  Service: Neurosurgery;;  Thoracic One-Two Laminectomy    Prior to Admission medications   Medication Sig Start Date End Date Taking? Authorizing Provider  docusate sodium (COLACE) 100 MG capsule Take 2 capsules (200 mg total) by mouth 2 (two) times daily. 07/14/17  Yes Royston Cowper, MD  glipiZIDE (GLUCOTROL XL) 2.5 MG 24 hr tablet Take 2.5 mg by mouth at bedtime.     Yes [provider]  hydrALAZINE (APRESOLINE) 100 MG tablet Take 100 mg by mouth 3 (three) times daily.  11/02/13  Yes [provider]  HYDROcodone-acetaminophen (NORCO) 7.5-325 MG tablet Take 1 tablet by mouth 2 (two) times daily as needed.  04/08/18  Yes [provider]  Iron-Vitamins (GERITOL PO) Take 1 tablet by mouth daily.   Yes [provider]  losartan (COZAAR) 100 MG tablet Take 100 mg by mouth every evening.    Yes [provider]  magnesium oxide (MAG-OX) 400 MG tablet Take 400 mg by mouth every evening.    Yes [provider]  metoprolol succinate (TOPROL-XL) 100 MG 24 hr tablet Take 100 mg by mouth  daily.    Yes [provider]  pantoprazole (PROTONIX) 40 MG tablet Take 1 tablet (40 mg total) by mouth daily. 03/31/18 03/31/19 Yes Gouru, Aruna, MD  potassium chloride (K-DUR,KLOR-CON) 10 MEQ tablet Take 20 mEq by mouth 2 (two) times daily. Two tabs daily   Yes [provider]  torsemide (DEMADEX) 20 MG tablet Take 40 mg by mouth daily.  03/02/18  Yes [provider]  acetaminophen (TYLENOL) 325 MG tablet Take 1 tablet (325 mg total) by mouth every 6 (six) hours as needed for mild pain, fever or headache (or Fever >/= 101). Patient not taking: Reported on 04/15/2018 03/31/18   Nicholes Mango, MD  b complex vitamins tablet Take 1 tablet by mouth daily with lunch.     [provider]  Cholecalciferol (VITAMIN D3) 1000 units CAPS Take 1,000 Units by mouth daily with lunch.     [provider]  dicyclomine (BENTYL) 10 MG capsule Take 10 mg by mouth 4 (four) times daily as needed for spasms.     [provider]  donepezil (ARICEPT) 5 MG tablet Take 5  mg by mouth every morning. 04/16/16   [provider]  feeding supplement, ENSURE ENLIVE, (ENSURE ENLIVE) LIQD Take 237 mLs by mouth 2 (two) times daily between meals. 03/31/18   Gouru, Illene Silver, MD  ferrous sulfate 325 (65 FE) MG EC tablet Take 1 tablet (325 mg total) by mouth 2 (two) times daily. Patient not taking: Reported on 11/22/2018 03/31/18 03/31/19  Nicholes Mango, MD  GLUCOSAMINE-CHONDROITIN PO Take 1 tablet by mouth 2 (two) times daily.     [provider]  hydrochlorothiazide (HYDRODIURIL) 25 MG tablet TAKE ONE TABLET EVERY DAY 03/15/18   [provider]  Meth-Hyo-M Bl-Na Phos-Ph Sal (URO-MP) 118 MG CAPS Take 1 capsule by mouth 2 (two) times daily.    [provider]  Multiple Vitamin (MULTIVITAMIN WITH MINERALS) TABS tablet Take 1 tablet by mouth daily. Patient not taking: Reported on 11/28/2018 04/01/18   Nicholes Mango, MD  sertraline (ZOLOFT) 100 MG tablet Take 100 mg by  mouth every evening.     [provider]    Allergies Ezetimibe and Statins  Family History  Problem Relation Age of Onset  . Heart Problems Mother   . Tuberculosis Father   . Heart Problems Sister   . Aortic aneurysm Brother   . Heart attack Brother   . Heart Problems Maternal Grandmother   . Heart Problems Brother   . Heart Problems Brother   . Anesthesia problems Neg Hx   . Hypotension Neg Hx   . Malignant hyperthermia Neg Hx   . Pseudochol deficiency Neg Hx     Social History Social History   Tobacco Use  . Smoking status: Former Smoker    Years: 20.00    Types: Pipe    Last attempt to quit: 07/10/1975    Years since quitting: 43.4  . Smokeless tobacco: Never Used  Substance Use Topics  . Alcohol use: Not Currently  . Drug use: No    Review of Systems Constitutional: No fever/chills Eyes: No visual changes. ENT: No sore throat. Cardiovascular: Denies chest pain. Respiratory: +shortness of breath. Gastrointestinal: Constipation, no abdominal pain or vomiting. Genitourinary: Negative for dysuria. Musculoskeletal: Negative for neck pain.  Negative for back pain. Integumentary: Negative for rash. Neurological: Negative for headaches, focal weakness or numbness.   ____________________________________________   PHYSICAL EXAM:  VITAL SIGNS: ED Triage Vitals  Enc Vitals Group     BP 11/27/18 2323 137/65     Pulse Rate 11/27/18 2323 (!) 117     Resp 11/27/18 2323 17     Temp 11/27/18 2323 98 F (36.7 C)     Temp Source 11/27/18 2323 Oral     SpO2 11/27/18 2321 97 %     Weight 11/27/18 2324 99.8 kg (220 lb)     Height 11/27/18 2324 1.727 m (5\' 8" )     Head Circumference --      Peak Flow --      Pain Score 11/27/18 2324 0     Pain Loc --      Pain Edu? --      Excl. in [place]? --     Constitutional: Alert and oriented.  Appears elderly and chronically ill but is not in acute distress other than some slight increased work of breathing. Eyes:  Conjunctivae are normal.  Head: Atraumatic. Nose: No congestion/rhinnorhea. Mouth/Throat: Mucous membranes are moist. Neck: No stridor.  No meningeal signs.   Cardiovascular: Irregularly irregular rhythm with elevated rate around 100-115. Good peripheral circulation. Grossly normal heart sounds.  Respiratory: Increased respiratory effort with several normal breaths followed by breath with increased effort and accessory muscle usage.  He has some coarse breath sounds in bilateral bases but generally his breath sounds are reassuring with good air movement and no wheezing. Gastrointestinal: Soft and nontender. No distention.  Musculoskeletal: No lower extremity tenderness nor edema. No gross deformities of extremities. Neurologic:  Normal speech and language. No gross focal neurologic deficits are appreciated.  Skin:  Skin is warm, dry and intact. No rash noted. Psychiatric: Mood and affect are normal. Speech and behavior are normal.  ____________________________________________   LABS (all labs ordered are listed, but only abnormal results are displayed)  Labs Reviewed  CBC - Abnormal; Notable for the following components:      Result Value   WBC 12.6 (*)    RBC 3.46 (*)    Hemoglobin 8.1 (*)    HCT 27.7 (*)    MCH 23.4 (*)    MCHC 29.2 (*)    RDW 16.4 (*)    All other components within normal limits  TROPONIN I - Abnormal; Notable for the following components:   Troponin I 0.04 (*)    All other components within normal limits  COMPREHENSIVE METABOLIC PANEL - Abnormal; Notable for the following components:   Glucose, Bld 200 (*)    BUN 31 (*)    Creatinine, Ser 1.67 (*)    Calcium 8.5 (*)    Total Protein 6.1 (*)    AST 56 (*)    ALT 80 (*)    GFR calc non Af Amer 36 (*)    GFR calc Af Amer 42 (*)    All other components within normal limits  LIPASE, BLOOD - Abnormal; Notable for the following components:   Lipase 63 (*)    All other components within normal limits  BRAIN  NATRIURETIC PEPTIDE - Abnormal; Notable for the following components:   B Natriuretic Peptide 375.0 (*)    All other components within normal limits   ____________________________________________  EKG  ED ECG REPORT I, Hinda Kehr, the attending physician, personally viewed and interpreted this ECG.  Date: 11/27/2018 EKG Time: 23: 27 Rate: 115 Rhythm: Atrial fibrillation QRS Axis: normal Intervals: normal ST/T Wave abnormalities: Non-specific ST segment / T-wave changes, but no evidence of acute ischemia. Narrative Interpretation: no evidence of acute ischemia   ____________________________________________  RADIOLOGY I, Hinda Kehr, personally viewed and evaluated these images (plain radiographs) as part of my medical decision making, as well as reviewing the written report by the radiologist.  ED MD interpretation: Pleural effusion and possible infiltrate on the right.  CT scan of the abdomen and pelvis demonstrates constipation but no acute or emergent condition.  CT scan of the chest reveals a moderate right-sided pleural effusion.  No other acute findings.  Official radiology report(s): Dg Chest 2 View  Result Date: 11/28/2018 CLINICAL DATA:  84 year old male with irregular heartbeat. EXAM: CHEST - 2 VIEW COMPARISON:  Chest radiograph dated 02/27/2018 FINDINGS: There is a small right pleural effusion common new since the prior radiograph. Right lung base atelectasis or infiltrate. The left lung is clear. No pneumothorax. Stable mild cardiomegaly. Atherosclerotic calcification of the aortic arch. No acute osseous pathology. Degenerative changes of the spine and lower cervical fixation hardware. IMPRESSION: Small right pleural effusion with right lung base atelectasis or infiltrate. Electronically Signed   By: Anner Crete M.D.   On: 11/28/2018 00:35   Ct Chest W Contrast  Result Date: 11/28/2018 CLINICAL DATA:  Pt arrives from home with c/o constipation since 630pm  tonight; pt says he has not a normal bowel movement since yesterday; EMS says pt was using accessory muscles to breathe-sats 97% on room air with clear lungs to auscultation;.*comment was truncated*^194mL OMNIPAQUE IOHEXOL 300 MG/ML SOLNConstipation Bowel obstruction, high-grade Neoplasm: colorectal, recurrence, suspected/known Abd pain, acute, generalized constipation, hx of melena and colon cancer (untreated); Dyspnea, chronic, neg or nondiagnostic xray Chest pain or SOB, pleurisy or effusion suspected SOB w/ accessory muscles, possible infiltrate or effusion on CXR, hx of untreated cancer EXAM: CT CHEST, ABDOMEN, AND PELVIS WITH CONTRAST TECHNIQUE: Multidetector CT imaging of the chest, abdomen and pelvis was performed following the standard protocol during bolus administration of intravenous contrast. CONTRAST:  191mL OMNIPAQUE IOHEXOL 300 MG/ML  SOLN COMPARISON:  None. FINDINGS: CT CHEST FINDINGS Cardiovascular: Coronary artery calcification and aortic atherosclerotic calcification. Mediastinum/Nodes: No axillary supraclavicular adenopathy. No mediastinal hilar adenopathy. No pericardial effusion Lungs/Pleura: Moderate size RIGHT effusion. Passive atelectasis of the RIGHT lower lobe. Musculoskeletal: No aggressive osseous lesion. CT ABDOMEN AND PELVIS FINDINGS Hepatobiliary: No focal hepatic lesion. No biliary ductal dilatation. Gallbladder is normal. Common bile duct is normal. Pancreas: Pancreas is normal. No ductal dilatation. No pancreatic inflammation. Spleen: Normal spleen Adrenals/urinary tract: Adrenal glands normal. Atrophic RIGHT kidney. LEFT kidney normal. Bladder normal Stomach/Bowel: Stomach and duodenum normal. Small-bowel normal caliber. There is a ventral abdominal wall hernia which contains a 10 cm segment of nonobstructed small bowel. More superiorly there is a 10 cm segment of nonobstructed transverse colon. The appendix and cecum normal. Colon and rectosigmoid colon normal. Moderate volume  stool in the rectosigmoid colon. No obstructing lesion identified. Vascular/Lymphatic: Abdominal aorta is normal caliber with atherosclerotic calcification. There is no retroperitoneal or periportal lymphadenopathy. No pelvic lymphadenopathy. Reproductive: Prostate normal Other: No free fluid. Musculoskeletal: No aggressive osseous lesion. IMPRESSION: Chest Impression: 1. Moderate RIGHT effusion with passive atelectasis 2. No airspace disease Abdomen / Pelvis Impression: 1. Moderate volume stool in the rectosigmoid colon could indicate constipation. No evidence of obstruction. 2. Multiple ventral hernias contain nonobstructed loops of small bowel and colon. 3. Atrophic RIGHT kidney. 4.  Aortic Atherosclerosis (ICD10-I70.0). Electronically Signed   By: Suzy Bouchard M.D.   On: 11/28/2018 01:36   Ct Abdomen Pelvis W Contrast  Result Date: 11/28/2018 CLINICAL DATA:  Pt arrives from home with c/o constipation since 630pm tonight; pt says he has not a normal bowel movement since yesterday; EMS says pt was using accessory muscles to breathe-sats 97% on room air with clear lungs to auscultation;.*comment was truncated*^172mL OMNIPAQUE IOHEXOL 300 MG/ML SOLNConstipation Bowel obstruction, high-grade Neoplasm: colorectal, recurrence, suspected/known Abd pain, acute, generalized constipation, hx of melena and colon cancer (untreated); Dyspnea, chronic, neg or nondiagnostic xray Chest pain or SOB, pleurisy or effusion suspected SOB w/ accessory muscles, possible infiltrate or effusion on CXR, hx of untreated cancer EXAM: CT CHEST, ABDOMEN, AND PELVIS WITH CONTRAST TECHNIQUE: Multidetector CT imaging of the chest, abdomen and pelvis was performed following the standard protocol during bolus administration of intravenous contrast. CONTRAST:  1109mL OMNIPAQUE IOHEXOL 300 MG/ML  SOLN COMPARISON:  None. FINDINGS: CT CHEST FINDINGS Cardiovascular: Coronary artery calcification and aortic atherosclerotic calcification.  Mediastinum/Nodes: No axillary supraclavicular adenopathy. No mediastinal hilar adenopathy. No pericardial effusion Lungs/Pleura: Moderate size RIGHT effusion. Passive atelectasis of the RIGHT lower lobe. Musculoskeletal: No aggressive osseous lesion. CT ABDOMEN AND PELVIS FINDINGS Hepatobiliary: No focal hepatic lesion. No biliary ductal dilatation. Gallbladder is normal. Common bile duct is normal. Pancreas: Pancreas is normal.  No ductal dilatation. No pancreatic inflammation. Spleen: Normal spleen Adrenals/urinary tract: Adrenal glands normal. Atrophic RIGHT kidney. LEFT kidney normal. Bladder normal Stomach/Bowel: Stomach and duodenum normal. Small-bowel normal caliber. There is a ventral abdominal wall hernia which contains a 10 cm segment of nonobstructed small bowel. More superiorly there is a 10 cm segment of nonobstructed transverse colon. The appendix and cecum normal. Colon and rectosigmoid colon normal. Moderate volume stool in the rectosigmoid colon. No obstructing lesion identified. Vascular/Lymphatic: Abdominal aorta is normal caliber with atherosclerotic calcification. There is no retroperitoneal or periportal lymphadenopathy. No pelvic lymphadenopathy. Reproductive: Prostate normal Other: No free fluid. Musculoskeletal: No aggressive osseous lesion. IMPRESSION: Chest Impression: 1. Moderate RIGHT effusion with passive atelectasis 2. No airspace disease Abdomen / Pelvis Impression: 1. Moderate volume stool in the rectosigmoid colon could indicate constipation. No evidence of obstruction. 2. Multiple ventral hernias contain nonobstructed loops of small bowel and colon. 3. Atrophic RIGHT kidney. 4.  Aortic Atherosclerosis (ICD10-I70.0). Electronically Signed   By: Suzy Bouchard M.D.   On: 11/28/2018 01:36    ____________________________________________   PROCEDURES  Critical Care performed: No   Procedure(s) performed:    Procedures   ____________________________________________   INITIAL IMPRESSION / ASSESSMENT AND PLAN / ED COURSE  As part of my medical decision making, I reviewed the following data within the [place] History obtained from family, Nursing notes reviewed and incorporated, Labs reviewed , EKG interpreted , Old chart reviewed and Radiograph reviewed     Differential diagnosis includes, but is not limited to, slow transit constipation, obstruction secondary to colon cancer, SBO/ileus, electrolyte abnormality such as hypokalemia, pneumonia as a possible cause of shortness of breath versus CHF exacerbation or viral bronchitis.  The patient has declined to pursue aggressive treatment for his cancer and he saw his cardiologist yesterday.  He has paroxysmal atrial fibrillation and does not want to be on blood thinners because of a recent hospitalization for melena.  I obtained a CT scan with oral and IV contrast as soon as possible because I was concerned about worsening neoplasms that could be causing the constipation as well as to rule out SBO/ileus.  After his CT scan he had a large volume bowel movement and says he feels much better afterwards and is ready to go home.  I talked to him and his family about the presence of the pleural effusion and that if he stayed in the hospital they could potentially drain the effusion and improve his work of breathing.  He does not want to stay and wants to follow-up as an outpatient and I think that is appropriate and he certainly has the capacity to make his own decisions.  He understands that his heart rate is elevated and that he has the fluid on his right lung and he is comfortable with this plan, as is his wife and daughter.  I encourage close outpatient follow-up and gave strict return precautions.  I also provided information about an appropriate bowel regimen which they will try at home.      ____________________________________________  FINAL CLINICAL IMPRESSION(S) / ED DIAGNOSES  Final diagnoses:  Constipation, unspecified constipation type  Pleural effusion  Paroxysmal atrial fibrillation ([place])     MEDICATIONS GIVEN DURING THIS VISIT:  Medications  iopamidol (ISOVUE-300) 61 % injection 30 mL (30 mLs Oral Contrast Given 11/28/18 0020)  iohexol (OMNIPAQUE) 300 MG/ML solution 80 mL (100 mLs Intravenous Contrast Given 11/28/18 0049)     ED Discharge Orders    None  Note:  This document was prepared using Dragon voice recognition software and may include unintentional dictation errors.    Hinda Kehr, MD 11/28/18 [phone]

## 2018-11-28 NOTE — Discharge Instructions (Signed)
You were seen in the emergency department today for constipation.  As we discussed, you also have a pleural effusion, which is fluid around 1 of your lungs, which is likely contributing to your shortness of breath.  However after we discussed it, you prefer to go home, we understand and agree with that plan.  It is, however, very important that you return immediately to the emergency department if you develop any new or worsening symptoms that concern you.    We recommend that you use one or more of the following over-the-counter medications in the order described:   1)  Colace (or Dulcolax) 100 mg:  This is a stool softener, and you may take it once or twice a day as needed. 2)  Senna tablets:  This is a bowel stimulant that will help "push" out your stool. It is the next step to add after you have tried a stool softener. 3)  Miralax (powder):  This medication works by drawing additional fluid into your intestines and helps to flush out your stool.  Mix the powder with water or juice according to label instructions.  It may help if the Colace and Senna are not sufficient, but you must be sure to use the recommended amount of water or juice when you mix up the powder. 4)  Look for magnesium citrate at the pharmacy (it is usually a small glass bottle).  Drink the bottle according to the label instructions.  Remember that narcotic pain medications are constipating, so avoid them or minimize their use.  Drink plenty of fluids.  Please return to the Emergency Department immediately if you develop new or worsening symptoms that concern you, such as (but not limited to) fever > 101 degrees, severe abdominal pain, or persistent vomiting.

## 2018-11-28 NOTE — ED Notes (Signed)
Wife and daughter in room waiting for pt to return from xray; pt had said when he arrived he was not aware of an irregular heartbeat; daughter says they were just at the cardiologist this past week for his Afib

## 2018-11-28 NOTE — ED Notes (Signed)
In to check to see if pt's blood pressure had taken; pt says he swallowed some water wrong and started coughing; says he threw up in his mouth and then swallowed it; pt assisted to sitting higher up in bed;

## 2018-11-28 NOTE — ED Notes (Signed)
Pt given water to drink as allowed by MD; pt with increasing shortness of breath just trying to sit up in the bed; daughter says pt usually just walks with walker from chair to kitchen to bathroom but gets equally winded at home doing so;

## 2018-11-28 NOTE — ED Notes (Signed)
In to check on pt; short of breath drinking contrast; assisted with repositioning in bed; sats 97% room air; pt then voiced the need to void; assist x 2 at bedside to attempt to use urinal; pt stood up and then sat right back down stating he no longer needed to void;

## 2018-11-28 NOTE — ED Notes (Signed)
Pt says he has not had a normal bowel movement since yesterday; has tried medication at home with no relief; abd distended but pt says this is his normal; pt anxious at times; says "I'm miserable"; feels like he needs to have a bowel movement but nothing happens; just strains with no results; lungs clear;  HR irregular; abd soft with bowel sounds active x 4 quads; skin pale; pt with history of colon cancer and has opted to have no treatment;

## 2018-11-28 NOTE — ED Notes (Signed)
Pt used call bell and stated he was incontinent of stool; Eliezer Lofts, RN in to check on pt; pt had 2 episodes of small amount of liquid stool and then passed one long soft brown stool; pt voices much relief after having bowel movement; cleaned well; attends in place; side rails up x 2 with call bell in reach;

## 2018-11-28 NOTE — ED Notes (Signed)
Troponin 0.04 reported to Dr Karma Greaser; acknowledged

## 2018-11-28 NOTE — ED Notes (Signed)
Pt to radiology for chest xray

## 2018-11-28 NOTE — ED Notes (Signed)
Pt back from xray and has started to drink oral contrast for CT; wife and daughter at bedside

## 2018-11-28 NOTE — ED Notes (Signed)
Pt has finished oral contrast and is going for scan

## 2018-11-29 ENCOUNTER — Other Ambulatory Visit: Payer: Self-pay

## 2018-11-29 ENCOUNTER — Emergency Department: Payer: PPO

## 2018-11-29 ENCOUNTER — Encounter: Payer: Self-pay | Admitting: Emergency Medicine

## 2018-11-29 ENCOUNTER — Inpatient Hospital Stay
Admission: EM | Admit: 2018-11-29 | Discharge: 2018-12-02 | DRG: 291 | Disposition: A | Discharge status: Home / Self Care | Payer: PPO | Attending: Internal Medicine | Admitting: Internal Medicine

## 2018-11-29 DIAGNOSIS — N400 Enlarged prostate without lower urinary tract symptoms: Secondary | ICD-10-CM | POA: Diagnosis present

## 2018-11-29 DIAGNOSIS — I509 Heart failure, unspecified: Secondary | ICD-10-CM

## 2018-11-29 DIAGNOSIS — I5023 Acute on chronic systolic (congestive) heart failure: Secondary | ICD-10-CM | POA: Diagnosis present

## 2018-11-29 DIAGNOSIS — Z87442 Personal history of urinary calculi: Secondary | ICD-10-CM | POA: Diagnosis not present

## 2018-11-29 DIAGNOSIS — E876 Hypokalemia: Secondary | ICD-10-CM | POA: Diagnosis not present

## 2018-11-29 DIAGNOSIS — I5032 Chronic diastolic (congestive) heart failure: Secondary | ICD-10-CM | POA: Diagnosis not present

## 2018-11-29 DIAGNOSIS — R778 Other specified abnormalities of plasma proteins: Secondary | ICD-10-CM

## 2018-11-29 DIAGNOSIS — N183 Chronic kidney disease, stage 3 (moderate): Secondary | ICD-10-CM | POA: Diagnosis present

## 2018-11-29 DIAGNOSIS — F329 Major depressive disorder, single episode, unspecified: Secondary | ICD-10-CM | POA: Diagnosis present

## 2018-11-29 DIAGNOSIS — K439 Ventral hernia without obstruction or gangrene: Secondary | ICD-10-CM | POA: Diagnosis present

## 2018-11-29 DIAGNOSIS — J9811 Atelectasis: Secondary | ICD-10-CM | POA: Diagnosis present

## 2018-11-29 DIAGNOSIS — I130 Hypertensive heart and chronic kidney disease with heart failure and stage 1 through stage 4 chronic kidney disease, or unspecified chronic kidney disease: Principal | ICD-10-CM | POA: Diagnosis present

## 2018-11-29 DIAGNOSIS — Z87891 Personal history of nicotine dependence: Secondary | ICD-10-CM | POA: Diagnosis not present

## 2018-11-29 DIAGNOSIS — K219 Gastro-esophageal reflux disease without esophagitis: Secondary | ICD-10-CM | POA: Diagnosis present

## 2018-11-29 DIAGNOSIS — Z85820 Personal history of malignant melanoma of skin: Secondary | ICD-10-CM

## 2018-11-29 DIAGNOSIS — D631 Anemia in chronic kidney disease: Secondary | ICD-10-CM | POA: Diagnosis present

## 2018-11-29 DIAGNOSIS — J90 Pleural effusion, not elsewhere classified: Secondary | ICD-10-CM | POA: Diagnosis not present

## 2018-11-29 DIAGNOSIS — Z9841 Cataract extraction status, right eye: Secondary | ICD-10-CM | POA: Diagnosis not present

## 2018-11-29 DIAGNOSIS — Z961 Presence of intraocular lens: Secondary | ICD-10-CM | POA: Diagnosis present

## 2018-11-29 DIAGNOSIS — M1990 Unspecified osteoarthritis, unspecified site: Secondary | ICD-10-CM | POA: Diagnosis present

## 2018-11-29 DIAGNOSIS — Z8249 Family history of ischemic heart disease and other diseases of the circulatory system: Secondary | ICD-10-CM

## 2018-11-29 DIAGNOSIS — C189 Malignant neoplasm of colon, unspecified: Secondary | ICD-10-CM | POA: Diagnosis present

## 2018-11-29 DIAGNOSIS — Z66 Do not resuscitate: Secondary | ICD-10-CM | POA: Diagnosis present

## 2018-11-29 DIAGNOSIS — J449 Chronic obstructive pulmonary disease, unspecified: Secondary | ICD-10-CM | POA: Diagnosis present

## 2018-11-29 DIAGNOSIS — Z888 Allergy status to other drugs, medicaments and biological substances status: Secondary | ICD-10-CM

## 2018-11-29 DIAGNOSIS — E1122 Type 2 diabetes mellitus with diabetic chronic kidney disease: Secondary | ICD-10-CM | POA: Diagnosis present

## 2018-11-29 DIAGNOSIS — Z23 Encounter for immunization: Secondary | ICD-10-CM | POA: Diagnosis present

## 2018-11-29 DIAGNOSIS — F419 Anxiety disorder, unspecified: Secondary | ICD-10-CM | POA: Diagnosis present

## 2018-11-29 DIAGNOSIS — R7989 Other specified abnormal findings of blood chemistry: Secondary | ICD-10-CM

## 2018-11-29 DIAGNOSIS — Z79899 Other long term (current) drug therapy: Secondary | ICD-10-CM

## 2018-11-29 DIAGNOSIS — Z7984 Long term (current) use of oral hypoglycemic drugs: Secondary | ICD-10-CM

## 2018-11-29 DIAGNOSIS — I482 Chronic atrial fibrillation, unspecified: Secondary | ICD-10-CM | POA: Diagnosis present

## 2018-11-29 DIAGNOSIS — Z9842 Cataract extraction status, left eye: Secondary | ICD-10-CM

## 2018-11-29 DIAGNOSIS — R0602 Shortness of breath: Secondary | ICD-10-CM | POA: Diagnosis not present

## 2018-11-29 DIAGNOSIS — E7800 Pure hypercholesterolemia, unspecified: Secondary | ICD-10-CM | POA: Diagnosis present

## 2018-11-29 LAB — COMPREHENSIVE METABOLIC PANEL
ALT: 77 U/L — ABNORMAL HIGH (ref 0–44)
AST: 48 U/L — ABNORMAL HIGH (ref 15–41)
Albumin: 3.9 g/dL (ref 3.5–5.0)
Alkaline Phosphatase: 89 U/L (ref 38–126)
Anion gap: 11 (ref 5–15)
BUN: 30 mg/dL — ABNORMAL HIGH (ref 8–23)
CALCIUM: 8.8 mg/dL — AB (ref 8.9–10.3)
CO2: 27 mmol/L (ref 22–32)
Chloride: 97 mmol/L — ABNORMAL LOW (ref 98–111)
Creatinine, Ser: 1.79 mg/dL — ABNORMAL HIGH (ref 0.61–1.24)
GFR calc Af Amer: 38 mL/min — ABNORMAL LOW (ref >60)
GFR, EST NON AFRICAN AMERICAN: 33 mL/min — AB (ref >60)
Glucose, Bld: 174 mg/dL — ABNORMAL HIGH (ref 70–99)
Potassium: 3.1 mmol/L — ABNORMAL LOW (ref 3.5–5.1)
Sodium: 135 mmol/L (ref 135–145)
Total Bilirubin: 0.9 mg/dL (ref 0.3–1.2)
Total Protein: 6.6 g/dL (ref 6.5–8.1)

## 2018-11-29 LAB — CBC
HEMATOCRIT: 29.4 % — AB (ref 39.0–52.0)
Hemoglobin: 8.5 g/dL — ABNORMAL LOW (ref 13.0–17.0)
MCH: 23 pg — AB (ref 26.0–34.0)
MCHC: 28.9 g/dL — ABNORMAL LOW (ref 30.0–36.0)
MCV: 79.7 fL — AB (ref 80.0–100.0)
Platelets: 277 10*3/uL (ref 150–400)
RBC: 3.69 MIL/uL — ABNORMAL LOW (ref 4.22–5.81)
RDW: 16.6 % — ABNORMAL HIGH (ref 11.5–15.5)
WBC: 10.8 10*3/uL — ABNORMAL HIGH (ref 4.0–10.5)
nRBC: 0.4 % — ABNORMAL HIGH (ref 0.0–0.2)

## 2018-11-29 LAB — BRAIN NATRIURETIC PEPTIDE: B Natriuretic Peptide: 712 pg/mL — ABNORMAL HIGH (ref 0.0–100.0)

## 2018-11-29 LAB — TROPONIN I: Troponin I: 0.06 ng/mL (ref <0.03)

## 2018-11-29 MED ORDER — FUROSEMIDE 10 MG/ML IJ SOLN
40.0000 mg | Freq: Once | INTRAMUSCULAR | Status: AC
  Administered 2018-11-29: 40 mg via INTRAVENOUS
  Filled 2018-11-29: qty 4

## 2018-11-29 MED ORDER — ASPIRIN 81 MG PO CHEW
324.0000 mg | CHEWABLE_TABLET | Freq: Once | ORAL | Status: AC
  Administered 2018-11-29: 324 mg via ORAL
  Filled 2018-11-29: qty 4

## 2018-11-29 NOTE — ED Notes (Signed)
Ambulatory pulse ox dropped to 88%

## 2018-11-29 NOTE — ED Triage Notes (Signed)
Short of breath x 1 week, increasing. Hx CHF.

## 2018-11-29 NOTE — ED Notes (Signed)
Blue tube sent to lab as well. 

## 2018-11-29 NOTE — H&P (Signed)
[facility] [place] NAME: Gary Chang    MR#:  [id number]  DATE OF BIRTH:  15-Feb-1928  DATE OF ADMISSION:  11/29/2018  PRIMARY CARE PHYSICIAN: [name], MD   REQUESTING/REFERRING PHYSICIAN:   CHIEF COMPLAINT:   Chief Complaint  Patient presents with  . Shortness of Breath    HISTORY OF PRESENT ILLNESS: Gary Chang  is a 84 y.o. male with a known history of excisional atrial fibrillation, not anticoagulated due to prior GI bleed, history of colon cancer not receiving treatment, COPD, CHF and other comorbidities. Patient presented to emergency room for worsening shortness of breath going on for the past 3 to 4 days, gradually getting worse.  His symptoms are worse with exertion, he denies any chest pain or palpitations.  In addition, he has orthopnea, which has worsened. No reports of fever or chills, no cough, no bleeding. Oxygen saturation is 96% on 2 L per nasal cannula. Blood test done emergency room are notable for BNP elevated at 712, troponin level is 0.06, creatinine 1.79, hemoglobin 8.5, potassium 3.1.  EKG shows atrial fibrillation with heart rate at 89 bpm, no acute ischemic changes. CT of the chest done 2 days ago when patient was again seen in emergency room for the same symptoms, shows:  Moderate RIGHT effusion with passive atelectasis 2. No airspace disease Abdomen / Pelvis Impression: 1. Moderate volume stool in the rectosigmoid colon could indicate constipation. No evidence of obstruction. 2. Multiple ventral hernias contain nonobstructed loops of small bowel and colon.  Patient is admitted for further evaluation and treatment.   PAST MEDICAL HISTORY:   Past Medical History:  Diagnosis Date  . Abdominal aortic aneurysm (AAA) ([place])   . Allergic state   . Anxiety   . Arthritis   . Cancer ([place])    skin ca removed  . CHF (congestive heart failure) ([place])   . COPD (chronic obstructive pulmonary disease)  ([place])   . Depression   . Diabetes mellitus    tingling in feet  . Dyspnea    with exertion  . Enlarged prostate    laser surgery  . GERD (gastroesophageal reflux disease)   . H/O hiatal hernia   . High cholesterol   . Hypertension    pcp  dr Jeneen Rinks [place]  [place]  . Kidney stones   . Melanoma ([place])   . Pneumonia   . 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 ABDOMINAL AORTIC ANEURYSM REPAIR    . BACK SURGERY     lumbar  ,cervical surgeries  . CATARACT EXTRACTION W/PHACO Left 12/11/2016   Procedure: CATARACT EXTRACTION PHACO AND INTRAOCULAR LENS PLACEMENT (IOC);  Surgeon: Leandrew Koyanagi, MD;  Location: [facility] ORS;  Service: Ophthalmology;  Laterality: Left;  [place] 1:17 AP% 17.7 CDE 13.8 FLUID PACK LOT # [id number]  . CATARACT EXTRACTION W/PHACO Right 08/26/2017   Procedure: CATARACT EXTRACTION PHACO AND INTRAOCULAR LENS PLACEMENT ([place]) RIGHT;  Surgeon: Leandrew Koyanagi, MD;  Location: [place];  Service: Ophthalmology;  Laterality: Right;  IVA TOPICAL DIABETES - oral meds  . COLONOSCOPY N/A 03/30/2018   Procedure: COLONOSCOPY;  Surgeon: Lin Landsman, MD;  Location[facility] END[facility]COPY;  Service: Gastroenterology;  Laterality: N/A;  . ESOPHAGOGASTRODUODENOSCOPY N/A 03/30/2018   Procedure: ESOPHAGOGASTRODUODENOSCOPY (EGD);  Surgeon: Lin Landsman, MD;  Location: [facility] ENDOSCOPY;  Service: Gastroenterology;  Laterality: N/A;  . ESOPHAGOGASTRODUODENOSCOPY (EGD) WITH PROPOFOL  N/A 02/11/2016   Procedure: ESOPHAGOGASTRODUODENOSCOPY (EGD) WITH PROPOFOL;  Surgeon: Hulen Luster, MD;  Location: [facility] ENDOSCOPY;  Service: Gastroenterology;  Laterality: N/A;  . EYE SURGERY     right cataract  . GREEN LIGHT LASER TURP (TRANSURETHRAL RESECTION OF PROSTATE N/A 07/14/2017   Procedure: GREEN LIGHT LASER TURP (TRANSURETHRAL RESECTION OF PROSTATE;  Surgeon: Royston Cowper, MD;  Location: [facility] ORS;  Service: Urology;   Laterality: N/A;  . HERNIA REPAIR     inguinal  . KIDNEY STONE SURGERY    . LUMBAR LAMINECTOMY/DECOMPRESSION MICRODISCECTOMY  12/10/2011   Procedure: LUMBAR LAMINECTOMY/DECOMPRESSION MICRODISCECTOMY;  Surgeon: Ophelia Charter;  Location: [facility] NEURO ORS;  Service: Neurosurgery;;  Thoracic One-Two Laminectomy    SOCIAL HISTORY:  Social History   Tobacco Use  . Smoking status: Former Smoker    Years: 20.00    Types: Pipe    Last attempt to quit: 07/10/1975    Years since quitting: 43.4  . Smokeless tobacco: Never Used  Substance Use Topics  . Alcohol use: Not Currently    FAMILY HISTORY:  Family History  Problem Relation Age of Onset  . Heart Problems Mother   . Tuberculosis Father   . Heart Problems Sister   . Aortic aneurysm Brother   . Heart attack Brother   . Heart Problems Maternal Grandmother   . Heart Problems Brother   . Heart Problems Brother   . Anesthesia problems Neg Hx   . Hypotension Neg Hx   . Malignant hyperthermia Neg Hx   . Pseudochol deficiency Neg Hx     DRUG ALLERGIES:  Allergies  Allergen Reactions  . Ezetimibe Other (See Comments)    "Numbness and tingling"  . Statins Other (See Comments)    "Numbness and tingling"    REVIEW OF SYSTEMS:   CONSTITUTIONAL: No fever, but positive for fatigue and generalized weakness.  EYES: No changes in vision.  EARS, NOSE, AND THROAT: No tinnitus or ear pain.  RESPIRATORY: Positive for shortness of breath worse with exertion.  No cough, wheezing or hemoptysis.  CARDIOVASCULAR: Positive for orthopnea.  No chest pain, edema.  GASTROINTESTINAL: Positive for colon cancer, not on treatment.  No nausea, vomiting, diarrhea or abdominal pain.  GENITOURINARY: No dysuria, hematuria.  ENDOCRINE: No polyuria, nocturia. HEMATOLOGY: No bleeding. SKIN: No rash or lesion. MUSCULOSKELETAL: No joint pain at this time.   NEUROLOGIC: No focal weakness.  PSYCHIATRY: No anxiety or depression.   MEDICATIONS AT HOME:  Prior  to Admission medications   Medication Sig Start Date End Date Taking? Authorizing Provider  docusate sodium (COLACE) 100 MG capsule Take 2 capsules (200 mg total) by mouth 2 (two) times daily. 07/14/17  Yes Royston Cowper, MD  feeding supplement, ENSURE ENLIVE, (ENSURE ENLIVE) LIQD Take 237 mLs by mouth 2 (two) times daily between meals. 03/31/18  Yes Gouru, Aruna, MD  glipiZIDE (GLUCOTROL XL) 2.5 MG 24 hr tablet Take 2.5 mg by mouth at bedtime.     Yes [provider]  hydrALAZINE (APRESOLINE) 100 MG tablet Take 100 mg by mouth 3 (three) times daily.  11/02/13  Yes [provider]  HYDROcodone-acetaminophen (NORCO) 7.5-325 MG tablet Take 1 tablet by mouth 2 (two) times daily as needed.  04/08/18  Yes [provider]  Iron-Vitamins (GERITOL PO) Take 1 tablet by mouth daily.   Yes [provider]  losartan (COZAAR) 100 MG tablet Take 100 mg by mouth every evening.    Yes [provider]  magnesium oxide (MAG-OX)  400 MG tablet Take 400 mg by mouth every evening.    Yes [provider]  metoprolol succinate (TOPROL-XL) 100 MG 24 hr tablet Take 100 mg by mouth daily.    Yes [provider]  pantoprazole (PROTONIX) 40 MG tablet Take 1 tablet (40 mg total) by mouth daily. 03/31/18 03/31/19 Yes Gouru, Aruna, MD  potassium chloride SA (K-DUR,KLOR-CON) 20 MEQ tablet Take 40 mEq by mouth 2 (two) times daily.    Yes [provider]  torsemide (DEMADEX) 20 MG tablet Take 40 mg by mouth daily.  03/02/18  Yes [provider]  acetaminophen (TYLENOL) 325 MG tablet Take 1 tablet (325 mg total) by mouth every 6 (six) hours as needed for mild pain, fever or headache (or Fever >/= 101). Patient not taking: Reported on 04/15/2018 03/31/18   Nicholes Mango, MD  ferrous sulfate 325 (65 FE) MG EC tablet Take 1 tablet (325 mg total) by mouth 2 (two) times daily. Patient not taking: Reported on 11/22/2018 03/31/18 03/31/19  Nicholes Mango, MD  Multiple  Vitamin (MULTIVITAMIN WITH MINERALS) TABS tablet Take 1 tablet by mouth daily. Patient not taking: Reported on 11/28/2018 04/01/18   Nicholes Mango, MD      PHYSICAL EXAMINATION:   VITAL SIGNS: Blood pressure 138/80, pulse 86, temperature 98.4 F (36.9 C), temperature source Oral, resp. rate 19, height 5\' 8"  (1.727 m), weight 99.8 kg, SpO2 99 %.  GENERAL:  84 y.o.-year-old patient lying in the bed with no acute distress.  EYES: Pupils equal, round, reactive to light and accommodation. No scleral icterus. Extraocular muscles intact.  HEENT: Head atraumatic, normocephalic. Oropharynx and nasopharynx clear.  NECK:  Supple, no jugular venous distention. No thyroid enlargement, no tenderness.  LUNGS: Decreased breath sounds at the right lung area, no wheezing, rales,rhonchi or crepitation. No use of accessory muscles of respiration.  CARDIOVASCULAR: S1, S2 normal. No S3/S4.  ABDOMEN: Soft, nontender, nondistended. Bowel sounds present. No organomegaly or mass.  EXTREMITIES: No pedal edema, cyanosis, or clubbing.  NEUROLOGIC: Cranial nerves II through XII are intact. Muscle strength 5/5 in all extremities. Sensation intact.   PSYCHIATRIC: The patient is alert and oriented x 3.  SKIN: No obvious rash, lesion, or ulcer.   LABORATORY PANEL:   CBC Recent Labs  Lab 11/27/18 2329 11/29/18 1601  WBC 12.6* 10.8*  HGB 8.1* 8.5*  HCT 27.7* 29.4*  PLT 278 277  MCV 80.1 79.7*  MCH 23.4* 23.0*  MCHC 29.2* 28.9*  RDW 16.4* 16.6*   ------------------------------------------------------------------------------------------------------------------  Chemistries  Recent Labs  Lab 11/27/18 2329 11/29/18 1601  NA 136 135  K 3.6 3.1*  CL 98 97*  CO2 25 27  GLUCOSE 200* 174*  BUN 31* 30*  CREATININE 1.67* 1.79*  CALCIUM 8.5* 8.8*  AST 56* 48*  ALT 80* 77*  ALKPHOS 88 89  BILITOT 1.1 0.9    ------------------------------------------------------------------------------------------------------------------ estimated creatinine clearance is 32.7 mL/min (A) (by C-G formula based on SCr of 1.79 mg/dL (H)). ------------------------------------------------------------------------------------------------------------------ No results for input(s): TSH, T4TOTAL, T3FREE, THYROIDAB in the last 72 hours.  Invalid input(s): FREET3   Coagulation profile No results for input(s): INR, PROTIME in the last 168 hours. ------------------------------------------------------------------------------------------------------------------- No results for input(s): DDIMER in the last 72 hours. -------------------------------------------------------------------------------------------------------------------  Cardiac Enzymes Recent Labs  Lab 11/27/18 2329 11/29/18 1601  TROPONINI 0.04* 0.06*   ------------------------------------------------------------------------------------------------------------------ Invalid input(s): POCBNP  ---------------------------------------------------------------------------------------------------------------  Urinalysis    Component Value Date/Time   COLORURINE AMBER (A) 07/17/2017 2118   APPEARANCEUR HAZY (A) 07/17/2017 2118  APPEARANCEUR Clear 02/09/2014 2122   LABSPEC 1.016 07/17/2017 2118   LABSPEC 1.013 02/09/2014 2122   PHURINE 6.0 07/17/2017 2118   GLUCOSEU NEGATIVE 07/17/2017 2118   GLUCOSEU Negative 02/09/2014 2122   HGBUR LARGE (A) 07/17/2017 2118   BILIRUBINUR NEGATIVE 07/17/2017 2118   BILIRUBINUR Negative 02/09/2014 2122   KETONESUR NEGATIVE 07/17/2017 2118   PROTEINUR 100 (A) 07/17/2017 2118   NITRITE NEGATIVE 07/17/2017 2118   LEUKOCYTESUR SMALL (A) 07/17/2017 2118   LEUKOCYTESUR Negative 02/09/2014 2122     RADIOLOGY: Dg Chest 2 View  Result Date: 11/29/2018 CLINICAL DATA:  Follow-up right effusion EXAM: CHEST - 2 VIEW  COMPARISON:  11/28/2018 FINDINGS: Cardiac shadow is stable. Aortic calcifications are again seen. Left lung is clear. Right-sided effusion is noted and stable. The degree of atelectatic change in the right base has improved in the interval from the prior study. No new focal abnormality is noted. IMPRESSION: Small right-sided pleural effusion. Electronically Signed   By: Inez Catalina M.D.   On: 11/29/2018 16:35   Dg Chest 2 View  Result Date: 11/28/2018 CLINICAL DATA:  84 year old male with irregular heartbeat. EXAM: CHEST - 2 VIEW COMPARISON:  Chest radiograph dated 02/27/2018 FINDINGS: There is a small right pleural effusion common new since the prior radiograph. Right lung base atelectasis or infiltrate. The left lung is clear. No pneumothorax. Stable mild cardiomegaly. Atherosclerotic calcification of the aortic arch. No acute osseous pathology. Degenerative changes of the spine and lower cervical fixation hardware. IMPRESSION: Small right pleural effusion with right lung base atelectasis or infiltrate. Electronically Signed   By: Anner Crete M.D.   On: 11/28/2018 00:35   Ct Chest W Contrast  Result Date: 11/28/2018 CLINICAL DATA:  Pt arrives from home with c/o constipation since 630pm tonight; pt says he has not a normal bowel movement since yesterday; EMS says pt was using accessory muscles to breathe-sats 97% on room air with clear lungs to auscultation;.*comment was truncated*^126mL OMNIPAQUE IOHEXOL 300 MG/ML SOLNConstipation Bowel obstruction, high-grade Neoplasm: colorectal, recurrence, suspected/known Abd pain, acute, generalized constipation, hx of melena and colon cancer (untreated); Dyspnea, chronic, neg or nondiagnostic xray Chest pain or SOB, pleurisy or effusion suspected SOB w/ accessory muscles, possible infiltrate or effusion on CXR, hx of untreated cancer EXAM: CT CHEST, ABDOMEN, AND PELVIS WITH CONTRAST TECHNIQUE: Multidetector CT imaging of the chest, abdomen and pelvis was  performed following the standard protocol during bolus administration of intravenous contrast. CONTRAST:  116mL OMNIPAQUE IOHEXOL 300 MG/ML  SOLN COMPARISON:  None. FINDINGS: CT CHEST FINDINGS Cardiovascular: Coronary artery calcification and aortic atherosclerotic calcification. Mediastinum/Nodes: No axillary supraclavicular adenopathy. No mediastinal hilar adenopathy. No pericardial effusion Lungs/Pleura: Moderate size RIGHT effusion. Passive atelectasis of the RIGHT lower lobe. Musculoskeletal: No aggressive osseous lesion. CT ABDOMEN AND PELVIS FINDINGS Hepatobiliary: No focal hepatic lesion. No biliary ductal dilatation. Gallbladder is normal. Common bile duct is normal. Pancreas: Pancreas is normal. No ductal dilatation. No pancreatic inflammation. Spleen: Normal spleen Adrenals/urinary tract: Adrenal glands normal. Atrophic RIGHT kidney. LEFT kidney normal. Bladder normal Stomach/Bowel: Stomach and duodenum normal. Small-bowel normal caliber. There is a ventral abdominal wall hernia which contains a 10 cm segment of nonobstructed small bowel. More superiorly there is a 10 cm segment of nonobstructed transverse colon. The appendix and cecum normal. Colon and rectosigmoid colon normal. Moderate volume stool in the rectosigmoid colon. No obstructing lesion identified. Vascular/Lymphatic: Abdominal aorta is normal caliber with atherosclerotic calcification. There is no retroperitoneal or periportal lymphadenopathy. No pelvic lymphadenopathy. Reproductive: Prostate normal Other:  No free fluid. Musculoskeletal: No aggressive osseous lesion. IMPRESSION: Chest Impression: 1. Moderate RIGHT effusion with passive atelectasis 2. No airspace disease Abdomen / Pelvis Impression: 1. Moderate volume stool in the rectosigmoid colon could indicate constipation. No evidence of obstruction. 2. Multiple ventral hernias contain nonobstructed loops of small bowel and colon. 3. Atrophic RIGHT kidney. 4.  Aortic Atherosclerosis  (ICD10-I70.0). Electronically Signed   By: Suzy Bouchard M.D.   On: 11/28/2018 01:36   Ct Abdomen Pelvis W Contrast  Result Date: 11/28/2018 CLINICAL DATA:  Pt arrives from home with c/o constipation since 630pm tonight; pt says he has not a normal bowel movement since yesterday; EMS says pt was using accessory muscles to breathe-sats 97% on room air with clear lungs to auscultation;.*comment was truncated*^158mL OMNIPAQUE IOHEXOL 300 MG/ML SOLNConstipation Bowel obstruction, high-grade Neoplasm: colorectal, recurrence, suspected/known Abd pain, acute, generalized constipation, hx of melena and colon cancer (untreated); Dyspnea, chronic, neg or nondiagnostic xray Chest pain or SOB, pleurisy or effusion suspected SOB w/ accessory muscles, possible infiltrate or effusion on CXR, hx of untreated cancer EXAM: CT CHEST, ABDOMEN, AND PELVIS WITH CONTRAST TECHNIQUE: Multidetector CT imaging of the chest, abdomen and pelvis was performed following the standard protocol during bolus administration of intravenous contrast. CONTRAST:  157mL OMNIPAQUE IOHEXOL 300 MG/ML  SOLN COMPARISON:  None. FINDINGS: CT CHEST FINDINGS Cardiovascular: Coronary artery calcification and aortic atherosclerotic calcification. Mediastinum/Nodes: No axillary supraclavicular adenopathy. No mediastinal hilar adenopathy. No pericardial effusion Lungs/Pleura: Moderate size RIGHT effusion. Passive atelectasis of the RIGHT lower lobe. Musculoskeletal: No aggressive osseous lesion. CT ABDOMEN AND PELVIS FINDINGS Hepatobiliary: No focal hepatic lesion. No biliary ductal dilatation. Gallbladder is normal. Common bile duct is normal. Pancreas: Pancreas is normal. No ductal dilatation. No pancreatic inflammation. Spleen: Normal spleen Adrenals/urinary tract: Adrenal glands normal. Atrophic RIGHT kidney. LEFT kidney normal. Bladder normal Stomach/Bowel: Stomach and duodenum normal. Small-bowel normal caliber. There is a ventral abdominal wall hernia  which contains a 10 cm segment of nonobstructed small bowel. More superiorly there is a 10 cm segment of nonobstructed transverse colon. The appendix and cecum normal. Colon and rectosigmoid colon normal. Moderate volume stool in the rectosigmoid colon. No obstructing lesion identified. Vascular/Lymphatic: Abdominal aorta is normal caliber with atherosclerotic calcification. There is no retroperitoneal or periportal lymphadenopathy. No pelvic lymphadenopathy. Reproductive: Prostate normal Other: No free fluid. Musculoskeletal: No aggressive osseous lesion. IMPRESSION: Chest Impression: 1. Moderate RIGHT effusion with passive atelectasis 2. No airspace disease Abdomen / Pelvis Impression: 1. Moderate volume stool in the rectosigmoid colon could indicate constipation. No evidence of obstruction. 2. Multiple ventral hernias contain nonobstructed loops of small bowel and colon. 3. Atrophic RIGHT kidney. 4.  Aortic Atherosclerosis (ICD10-I70.0). Electronically Signed   By: Suzy Bouchard M.D.   On: 11/28/2018 01:36    EKG: Orders placed or performed during the hospital encounter of 11/29/18  . ED EKG within 10 minutes  . ED EKG within 10 minutes    IMPRESSION AND PLAN:  1.  Acute respiratory failure with hypoxia, likely multifactorial, secondary to CHF exacerbation, anemia and pleural effusion.  We will start treatment with Lasix IV and oxygen.  Continue to monitor clinically closely.  He might require pleural effusion drainage if no improvement.  Continue to monitor hemoglobin level closely and transfuse blood, as needed,for hemoglobin level lower than 7.  2.  Acute CHF exacerbation, see treatment as above under #1.  3.  Chronic atrial fibrillation, rate controlled.  Patient is not on anticoagulation due to recent GI bleed.  4.  Borderline elevated troponin level, no chest pain.  This is likely secondary to demand ischemia from acute CHF exacerbation and atrial fibrillation.  Patient does not want  aggressive interventions.  We will continue to monitor on telemetry and monitor patient clinically closely.  5.  CKD 3, stable.  Creatinine is at baseline.  Continue to monitor kidney function closely and avoid nephrotoxic medications.  6.  Colon cancer, not on any treatment, per patient's choice.  7.  Hypertension, stable, resume home medications.  All the records are reviewed and case discussed with ED provider. Management plans discussed with the patient, family and they are in agreement.  CODE STATUS: DNR Code Status History    Date Active Date Inactive Code Status Order ID Comments User Context   03/29/2018 0246 03/31/2018 1732 DNR [id number]  Lance Coon, MD Inpatient   02/10/2018 2005 02/12/2018 1526 DNR [id number]  Demetrios Loll, MD Inpatient   02/28/2016 1705 03/01/2016 2251 Full Code [id number]  Norman Herrlich, MD ED    Questions for Most Recent Historical Code Status (Order [id number])    Question Answer Comment   In the event of cardiac or respiratory ARREST Do not call a "code blue"    In the event of cardiac or respiratory ARREST Do not perform Intubation, CPR, defibrillation or ACLS    In the event of cardiac or respiratory ARREST Use medication by any route, position, wound care, and other measures to relive pain and suffering. May use oxygen, suction and manual treatment of airway obstruction as needed for comfort.         Advance Directive Documentation     Most Recent Value  Type of Advance Directive  Healthcare Power of Attorney  Pre-existing out of facility DNR order (yellow form or pink MOST form)  -  "MOST" Form in Place?  -       TOTAL TIME TAKING CARE OF THIS PATIENT: 50 minutes.    Amelia Jo M.D on 11/29/2018 at 10:33 PM  Between 7am to 6pm - Pager - [phone]  After 6pm go to [email or web address] - password [email or web address] [facility] [place] [facility]  [phone]  CC: Primary care physician; [name], MD

## 2018-11-29 NOTE — ED Notes (Signed)
When pt transferred from wheel chair to bed pt became extremely short of breath.  Pt also becomes short of breath while talking.

## 2018-11-29 NOTE — ED Triage Notes (Signed)
Pt in via EMS from [facility] in [place]. EMS reports pt seen Saturday for constipation and was dx'd  With pleural infusion but did not want to be admitted. EMS reports pt feels like he is smothering and is 98% on RA.  Per EMS, Pt with colon cancer and has refused treatment and is under palliative care but does not have his DNR with him.

## 2018-11-29 NOTE — ED Provider Notes (Signed)
[facility] Emergency Department Provider Note  ____________________________________________  Time seen: Approximately 6:09 PM  I have reviewed the triage vital signs and the nursing notes.   HISTORY  Chief Complaint Shortness of Breath    HPI Gary Chang is a 84 y.o. male the history of paroxysmal A. fib not anticoagulated due to prior GI bleed, colon cancer opting not to receive treatment, known right pleural effusion from visit 2 days ago, presenting with shortness of breath.  The patient reports was seen here 2 days ago for constipation, and was able to have a good bowel movement in the emergency department.  In the meantime, he also describes shortness of breath which was evaluated by the ED physician at that time.  He did have a right pleural effusion, as well as elevated troponin, and opted not to stay at the hospital for further evaluation or treatment.  Since then, the patient has had increasing exertional shortness of breath with decreased exercise tolerance; he describes just pulling back the sheets on his bed is causing him to be winded.  In addition, he has orthopnea which has worsened.  He has not been having any fevers or chills, cough or cold symptoms, or calf pain.  He reports that occasionally he gets a very brief central chest discomfort, but this is been going on for "a long time" and is not sustained or "bothersome."  Past Medical History:  Diagnosis Date  . Abdominal aortic aneurysm (AAA) ([place])   . Allergic state   . Anxiety   . Arthritis   . Cancer ([place])    skin ca removed  . CHF (congestive heart failure) ([place])   . COPD (chronic obstructive pulmonary disease) ([place])   . Depression   . Diabetes mellitus    tingling in feet  . Dyspnea    with exertion  . Enlarged prostate    laser surgery  . GERD (gastroesophageal reflux disease)   . H/O hiatal hernia   . High cholesterol   . Hypertension    pcp  dr Jeneen Rinks [place]  [place]   . Kidney stones   . Melanoma ([place])   . Pneumonia   . Renal artery aneurysm [facility])     Patient Active Problem List   Diagnosis Date Noted  . Colon cancer ([place]) 04/04/2018  . Iron deficiency anemia due to chronic blood loss   . Hematochezia   . GI bleed 03/29/2018  . GERD (gastroesophageal reflux disease) 03/29/2018  . COPD (chronic obstructive pulmonary disease) ([place]) 03/29/2018  . Acute renal failure superimposed on stage 3 chronic kidney disease ([place]) 03/29/2018  . Chronic diastolic heart failure ([place]) 03/04/2018  . PAF (paroxysmal atrial fibrillation) ([place]) 03/04/2018  . Diabetes ([place]) 03/04/2018  . Abdominal pain 01/30/2017  . Ventral hernia without obstruction or gangrene 01/30/2017  . Tension headache 04/30/2016  . Atypical facial pain 04/30/2016  . Left-sided weakness   . Occlusion and stenosis of basilar artery   . Vertebral artery stenosis   . Basilar artery stenosis 02/28/2016  . Dizziness and giddiness 11/29/2013  . Abnormality of gait 11/29/2013  . Diabetic neuropathy ([place]) 11/29/2013  . Memory loss 11/29/2013  . Headache, paroxysmal hemicrania, episodic 11/29/2013    Past Surgical History:  Procedure Laterality Date  . ABDOMINAL AORTIC ANEURYSM REPAIR    . BACK SURGERY     lumbar  ,cervical surgeries  . CATARACT EXTRACTION W/PHACO Left 12/11/2016   Procedure: CATARACT EXTRACTION PHACO AND INTRAOCULAR LENS PLACEMENT (IOC);  Surgeon:  Leandrew Koyanagi, MD;  Location: [facility] ORS;  Service: Ophthalmology;  Laterality: Left;  [place] 1:17 AP% 17.7 CDE 13.8 FLUID PACK LOT # [id number]  . CATARACT EXTRACTION W/PHACO Right 08/26/2017   Procedure: CATARACT EXTRACTION PHACO AND INTRAOCULAR LENS PLACEMENT ([place]) RIGHT;  Surgeon: Leandrew Koyanagi, MD;  Location: [place];  Service: Ophthalmology;  Laterality: Right;  IVA TOPICAL DIABETES - oral meds  . COLONOSCOPY N/A 03/30/2018   Procedure: COLONOSCOPY;  Surgeon: Lin Landsman, MD;  Location: [facility] ENDOSCOPY;   Service: Gastroenterology;  Laterality: N/A;  . ESOPHAGOGASTRODUODENOSCOPY N/A 03/30/2018   Procedure: ESOPHAGOGASTRODUODENOSCOPY (EGD);  Surgeon: Lin Landsman, MD;  Location: [facility] ENDOSCOPY;  Service: Gastroenterology;  Laterality: N/A;  . ESOPHAGOGASTRODUODENOSCOPY (EGD) WITH PROPOFOL N/A 02/11/2016   Procedure: ESOPHAGOGASTRODUODENOSCOPY (EGD) WITH PROPOFOL;  Surgeon: Hulen Luster, MD;  Location: [facility] ENDOSCOPY;  Service: Gastroenterology;  Laterality: N/A;  . EYE SURGERY     right cataract  . GREEN LIGHT LASER TURP (TRANSURETHRAL RESECTION OF PROSTATE N/A 07/14/2017   Procedure: GREEN LIGHT LASER TURP (TRANSURETHRAL RESECTION OF PROSTATE;  Surgeon: Royston Cowper, MD;  Location: [facility] ORS;  Service: Urology;  Laterality: N/A;  . HERNIA REPAIR     inguinal  . KIDNEY STONE SURGERY    . LUMBAR LAMINECTOMY/DECOMPRESSION MICRODISCECTOMY  12/10/2011   Procedure: LUMBAR LAMINECTOMY/DECOMPRESSION MICRODISCECTOMY;  Surgeon: Ophelia Charter;  Location: [facility] NEURO ORS;  Service: Neurosurgery;;  Thoracic One-Two Laminectomy    Current Outpatient Rx  . Order #: [id number] Class: No Print  . Order #: [id number] Class: Historical Med  . Order #: [id number] Class: Historical Med  . Order #: [id number] Class: Historical Med  . Order #: [id number] Class: Print  . Order #: [id number] Class: Historical Med  . Order #: [id number] Class: Print  . Order #: [id number] Class: No Print  . Order #: [id number] Class: Historical Med  . Order #: [id number] Class: Historical Med  . Order #: [id number] Class: Historical Med  . Order #: [id number] Class: Historical Med  . Order #: [id number] Class: Historical Med  . Order #: [id number] Class: Historical Med  . Order #: [id number] Class: Historical Med  . Order #: [id number] Class: Historical Med  . Order #: [id number] Class: Historical Med  . Order #: [id number] Class: Historical Med  . Order #: [id number] Class: No Print  . Order #: [id number] Class: Print  . Order #: [id number] Class: Historical Med   . Order #: [id number] Class: Historical Med  . Order #: [id number] Class: Historical Med    Allergies Ezetimibe and Statins  Family History  Problem Relation Age of Onset  . Heart Problems Mother   . Tuberculosis Father   . Heart Problems Sister   . Aortic aneurysm Brother   . Heart attack Brother   . Heart Problems Maternal Grandmother   . Heart Problems Brother   . Heart Problems Brother   . Anesthesia problems Neg Hx   . Hypotension Neg Hx   . Malignant hyperthermia Neg Hx   . Pseudochol deficiency Neg Hx     Social History Social History   Tobacco Use  . Smoking status: Former Smoker    Years: 20.00    Types: Pipe    Last attempt to quit: 07/10/1975    Years since quitting: 43.4  . Smokeless tobacco: Never Used  Substance Use Topics  . Alcohol use: Not Currently  . Drug use: No    Review of Systems Constitutional: No fever/chills.  No lightheadedness or syncope.  No diaphoresis. Eyes: No visual changes. ENT: No sore throat. No congestion or rhinorrhea. Cardiovascular: Positive chest pain. Denies palpitations. Respiratory:  Positive shortness of breath.  Mild nonproductive cough. Gastrointestinal: No abdominal pain.  No nausea, no vomiting.  No diarrhea.  Intermittent constipation.  Colon cancer. Genitourinary: Negative for dysuria. Musculoskeletal: Negative for back pain. Skin: Negative for rash. Neurological: Negative for headaches. No focal numbness, tingling or weakness.     ____________________________________________   PHYSICAL EXAM:  VITAL SIGNS: ED Triage Vitals  Enc Vitals Group     BP 11/29/18 1539 137/81     Pulse Rate 11/29/18 1539 84     Resp 11/29/18 1539 20     Temp 11/29/18 1539 98.4 F (36.9 C)     Temp Source 11/29/18 1539 Oral     SpO2 11/29/18 1539 96 %     Weight 11/29/18 1542 220 lb (99.8 kg)     Height 11/29/18 1542 5\' 8"  (1.727 m)     Head Circumference --      Peak Flow --      Pain Score 11/29/18 1542 0     Pain Loc --       Pain Edu? --      Excl. in [place]? --     Constitutional: Alert and oriented.  Answers questions appropriately. Eyes: Conjunctivae are normal.  EOMI. No scleral icterus. Head: Atraumatic. Nose: No congestion/rhinnorhea. Mouth/Throat: Mucous membranes are moist.  Neck: No stridor.  Supple.  No JVD.  No meningismus. Cardiovascular: Normal rate, regular rhythm. No murmurs, rubs or gallops.  Respiratory: Mild tachypnea without accessory muscle use or retractions.  The patient has no wheezes rales or rhonchi.  He has decreased breath sounds on the right side. Gastrointestinal: Overweight.  Soft, nontender and nondistended.  No guarding or rebound.  No peritoneal signs. Musculoskeletal: No LE edema. No ttp in the calves or palpable cords.  Negative Homan's sign. Neurologic:  A&Ox3.  Speech is clear.  Face and smile are symmetric.  EOMI.  Moves all extremities well. Skin:  Skin is warm, dry and intact. No rash noted. Psychiatric: Mood and affect are normal.   ____________________________________________   LABS (all labs ordered are listed, but only abnormal results are displayed)  Labs Reviewed  CBC - Abnormal; Notable for the following components:      Result Value   WBC 10.8 (*)    RBC 3.69 (*)    Hemoglobin 8.5 (*)    HCT 29.4 (*)    MCV 79.7 (*)    MCH 23.0 (*)    MCHC 28.9 (*)    RDW 16.6 (*)    nRBC 0.4 (*)    All other components within normal limits  TROPONIN I - Abnormal; Notable for the following components:   Troponin I 0.06 (*)    All other components within normal limits  COMPREHENSIVE METABOLIC PANEL - Abnormal; Notable for the following components:   Potassium 3.1 (*)    Chloride 97 (*)    Glucose, Bld 174 (*)    BUN 30 (*)    Creatinine, Ser 1.79 (*)    Calcium 8.8 (*)    AST 48 (*)    ALT 77 (*)    GFR calc non Af Amer 33 (*)    GFR calc Af Amer 38 (*)    All other components within normal limits  BRAIN NATRIURETIC PEPTIDE - Abnormal; Notable for the  following components:   B Natriuretic Peptide 712.0 (*)    All other components within normal limits   ____________________________________________  EKG  ED ECG REPORT I, Anne-Caroline Mariea Clonts, the attending physician, personally  viewed and interpreted this ECG.   Date: 11/29/2018  EKG Time: 1544  Rate: 89  Rhythm: afib; +PVC  Axis: normal  Intervals:none  ST&T Change: No STEMI  ____________________________________________  RADIOLOGY  Dg Chest 2 View  Result Date: 11/29/2018 CLINICAL DATA:  Follow-up right effusion EXAM: CHEST - 2 VIEW COMPARISON:  11/28/2018 FINDINGS: Cardiac shadow is stable. Aortic calcifications are again seen. Left lung is clear. Right-sided effusion is noted and stable. The degree of atelectatic change in the right base has improved in the interval from the prior study. No new focal abnormality is noted. IMPRESSION: Small right-sided pleural effusion. Electronically Signed   By: Inez Catalina M.D.   On: 11/29/2018 16:35    ____________________________________________   PROCEDURES  Procedure(s) performed: None  Procedures  Critical Care performed: Yes, see critical care note(s) ____________________________________________   INITIAL IMPRESSION / ASSESSMENT AND PLAN / ED COURSE  Pertinent labs & imaging results that were available during my care of the patient were reviewed by me and considered in my medical decision making (see chart for details).  84 y.o. male with a history of paroxysmal A. fib, colon cancer not being treated, presenting for shortness of breath.  Overall, the patient is hemodynamically stable.  We will do a ambulatory pulse ox to see how he does in terms of his oxygenation.  I have reviewed the chest x-ray myself and which appears to show a right elevated hemidiaphragm; this is less consistent with a large effusion; it is read as stable from prior.  He does have some pulmonary vascular congestion, which would be consistent with  congestive heart failure.  His BNP today is elevated from his baseline at 712.  I will treat him with Lasix.  He has an elevated troponin today at 0.06, which may be due to strain or an acute event; he will receive aspirin and a repeat troponin has been ordered.  At this time, he is not having any chest pain or discomfort, and is hemodynamically stable; heparinization will be deferred as the patient has recent history of GI bleed in April of this year; the risk outweighs the benefit.  The patient will be admitted for ongoing treatment and evaluation.   CRITICAL CARE Performed by: Eula Listen   Total critical care time: 35 minutes  Critical care time was exclusive of separately billable procedures and treating other patients.  Critical care was necessary to treat or prevent imminent or life-threatening deterioration.  Critical care was time spent personally by me on the following activities: development of treatment plan with patient and/or surrogate as well as nursing, discussions with consultants, evaluation of patient's response to treatment, examination of patient, obtaining history from patient or surrogate, ordering and performing treatments and interventions, ordering and review of laboratory studies, ordering and review of radiographic studies, pulse oximetry and re-evaluation of patient's condition.   ____________________________________________  FINAL CLINICAL IMPRESSION(S) / ED DIAGNOSES  Final diagnoses:  Elevated troponin  Recurrent right pleural effusion  Acute on chronic congestive heart failure, unspecified heart failure type ([place])         NEW MEDICATIONS STARTED DURING THIS VISIT:  New Prescriptions   No medications on file      Eula Listen, MD 11/29/18 1817

## 2018-11-29 NOTE — ED Notes (Signed)
Jemiah Cuadra RN ambulated the Pt to the bedside commode. Pt returned to his bed without difficulty.

## 2018-11-30 LAB — CBC
HCT: 27.3 % — ABNORMAL LOW (ref 39.0–52.0)
Hemoglobin: 8 g/dL — ABNORMAL LOW (ref 13.0–17.0)
MCH: 22.7 pg — ABNORMAL LOW (ref 26.0–34.0)
MCHC: 29.3 g/dL — ABNORMAL LOW (ref 30.0–36.0)
MCV: 77.6 fL — ABNORMAL LOW (ref 80.0–100.0)
Platelets: 271 10*3/uL (ref 150–400)
RBC: 3.52 MIL/uL — ABNORMAL LOW (ref 4.22–5.81)
RDW: 16.7 % — ABNORMAL HIGH (ref 11.5–15.5)
WBC: 12 10*3/uL — ABNORMAL HIGH (ref 4.0–10.5)
nRBC: 0.2 % (ref 0.0–0.2)

## 2018-11-30 LAB — BASIC METABOLIC PANEL
Anion gap: 12 (ref 5–15)
BUN: 31 mg/dL — ABNORMAL HIGH (ref 8–23)
CO2: 27 mmol/L (ref 22–32)
Calcium: 8.3 mg/dL — ABNORMAL LOW (ref 8.9–10.3)
Chloride: 98 mmol/L (ref 98–111)
Creatinine, Ser: 1.77 mg/dL — ABNORMAL HIGH (ref 0.61–1.24)
GFR calc non Af Amer: 34 mL/min — ABNORMAL LOW (ref >60)
GFR, EST AFRICAN AMERICAN: 39 mL/min — AB (ref >60)
Glucose, Bld: 139 mg/dL — ABNORMAL HIGH (ref 70–99)
Potassium: 2.9 mmol/L — ABNORMAL LOW (ref 3.5–5.1)
Sodium: 137 mmol/L (ref 135–145)

## 2018-11-30 LAB — POTASSIUM: POTASSIUM: 3.4 mmol/L — AB (ref 3.5–5.1)

## 2018-11-30 LAB — GLUCOSE, CAPILLARY
GLUCOSE-CAPILLARY: 155 mg/dL — AB (ref 70–99)
Glucose-Capillary: 197 mg/dL — ABNORMAL HIGH (ref 70–99)
Glucose-Capillary: 136 mg/dL — ABNORMAL HIGH (ref 70–99)
Glucose-Capillary: 145 mg/dL — ABNORMAL HIGH (ref 70–99)
Glucose-Capillary: 131 mg/dL — ABNORMAL HIGH (ref 70–99)

## 2018-11-30 LAB — MAGNESIUM: MAGNESIUM: 2.2 mg/dL (ref 1.7–2.4)

## 2018-11-30 MED ORDER — TORSEMIDE 20 MG PO TABS
40.0000 mg | ORAL_TABLET | Freq: Every day | ORAL | Status: DC
  Administered 2018-11-30 – 2018-12-02 (×3): 40 mg via ORAL
  Filled 2018-11-30 (×3): qty 2

## 2018-11-30 MED ORDER — MAGNESIUM OXIDE 400 (241.3 MG) MG PO TABS
400.0000 mg | ORAL_TABLET | Freq: Every evening | ORAL | Status: DC
  Administered 2018-11-30 – 2018-12-01 (×2): 400 mg via ORAL
  Filled 2018-11-30 (×2): qty 1

## 2018-11-30 MED ORDER — METOPROLOL SUCCINATE ER 100 MG PO TB24
100.0000 mg | ORAL_TABLET | Freq: Every day | ORAL | Status: DC
  Administered 2018-11-30 – 2018-12-02 (×3): 100 mg via ORAL
  Filled 2018-11-30 (×3): qty 1

## 2018-11-30 MED ORDER — ONDANSETRON HCL 4 MG/2ML IJ SOLN
4.0000 mg | Freq: Four times a day (QID) | INTRAMUSCULAR | Status: DC | PRN
  Administered 2018-11-30: 4 mg via INTRAVENOUS
  Filled 2018-11-30: qty 2

## 2018-11-30 MED ORDER — PNEUMOCOCCAL VAC POLYVALENT 25 MCG/0.5ML IJ INJ
0.5000 mL | INJECTION | INTRAMUSCULAR | Status: AC
  Administered 2018-12-02: 0.5 mL via INTRAMUSCULAR
  Filled 2018-11-30: qty 0.5

## 2018-11-30 MED ORDER — IPRATROPIUM-ALBUTEROL 0.5-2.5 (3) MG/3ML IN SOLN
3.0000 mL | Freq: Four times a day (QID) | RESPIRATORY_TRACT | Status: DC | PRN
  Administered 2018-11-30: 3 mL via RESPIRATORY_TRACT

## 2018-11-30 MED ORDER — INSULIN ASPART 100 UNIT/ML ~~LOC~~ SOLN
0.0000 [IU] | Freq: Three times a day (TID) | SUBCUTANEOUS | Status: DC
  Administered 2018-11-30 (×2): 3 [IU] via SUBCUTANEOUS
  Administered 2018-11-30: 2 [IU] via SUBCUTANEOUS
  Administered 2018-12-01: 3 [IU] via SUBCUTANEOUS
  Administered 2018-12-01 (×2): 2 [IU] via SUBCUTANEOUS
  Administered 2018-12-02: 3 [IU] via SUBCUTANEOUS
  Filled 2018-11-30 (×6): qty 1

## 2018-11-30 MED ORDER — HYDRALAZINE HCL 50 MG PO TABS
100.0000 mg | ORAL_TABLET | Freq: Three times a day (TID) | ORAL | Status: DC
  Administered 2018-11-30 – 2018-12-02 (×6): 100 mg via ORAL
  Filled 2018-11-30 (×7): qty 2

## 2018-11-30 MED ORDER — LOSARTAN POTASSIUM 50 MG PO TABS
100.0000 mg | ORAL_TABLET | Freq: Every evening | ORAL | Status: DC
  Administered 2018-11-30 – 2018-12-01 (×2): 100 mg via ORAL
  Filled 2018-11-30 (×2): qty 2

## 2018-11-30 MED ORDER — IPRATROPIUM-ALBUTEROL 0.5-2.5 (3) MG/3ML IN SOLN
RESPIRATORY_TRACT | Status: AC
  Filled 2018-11-30: qty 3

## 2018-11-30 MED ORDER — ACETAMINOPHEN 325 MG PO TABS
650.0000 mg | ORAL_TABLET | Freq: Four times a day (QID) | ORAL | Status: DC | PRN
  Administered 2018-11-30 – 2018-12-01 (×2): 650 mg via ORAL
  Filled 2018-11-30 (×2): qty 2

## 2018-11-30 MED ORDER — PREGABALIN 75 MG PO CAPS
75.0000 mg | ORAL_CAPSULE | Freq: Once | ORAL | Status: AC
  Administered 2018-11-30: 75 mg via ORAL
  Filled 2018-11-30: qty 1

## 2018-11-30 MED ORDER — ALUM & MAG HYDROXIDE-SIMETH 200-200-20 MG/5ML PO SUSP
30.0000 mL | ORAL | Status: DC | PRN
  Administered 2018-11-30: 30 mL via ORAL
  Filled 2018-11-30: qty 30

## 2018-11-30 MED ORDER — BISACODYL 5 MG PO TBEC
5.0000 mg | DELAYED_RELEASE_TABLET | Freq: Every day | ORAL | Status: DC | PRN
  Administered 2018-12-02: 5 mg via ORAL
  Filled 2018-11-30 (×2): qty 1

## 2018-11-30 MED ORDER — HEPARIN SODIUM (PORCINE) 5000 UNIT/ML IJ SOLN
5000.0000 [IU] | Freq: Three times a day (TID) | INTRAMUSCULAR | Status: DC
  Administered 2018-11-30 – 2018-12-01 (×5): 5000 [IU] via SUBCUTANEOUS
  Filled 2018-11-30 (×5): qty 1

## 2018-11-30 MED ORDER — TRAZODONE HCL 50 MG PO TABS
25.0000 mg | ORAL_TABLET | Freq: Every evening | ORAL | Status: DC | PRN
  Administered 2018-11-30 – 2018-12-02 (×2): 25 mg via ORAL
  Filled 2018-11-30 (×2): qty 1

## 2018-11-30 MED ORDER — DOCUSATE SODIUM 100 MG PO CAPS
100.0000 mg | ORAL_CAPSULE | Freq: Two times a day (BID) | ORAL | Status: DC
  Administered 2018-11-30 – 2018-12-02 (×5): 100 mg via ORAL
  Filled 2018-11-30 (×6): qty 1

## 2018-11-30 MED ORDER — INFLUENZA VAC SPLIT HIGH-DOSE 0.5 ML IM SUSY
0.5000 mL | PREFILLED_SYRINGE | INTRAMUSCULAR | Status: AC
  Administered 2018-12-02: 0.5 mL via INTRAMUSCULAR
  Filled 2018-11-30 (×2): qty 0.5

## 2018-11-30 MED ORDER — HYDROCODONE-ACETAMINOPHEN 7.5-325 MG PO TABS: 1.0000 | ORAL_TABLET | Freq: Two times a day (BID) | ORAL | Status: DC | PRN

## 2018-11-30 MED ORDER — PANTOPRAZOLE SODIUM 40 MG PO TBEC
40.0000 mg | DELAYED_RELEASE_TABLET | Freq: Every day | ORAL | Status: DC
  Administered 2018-11-30 – 2018-12-02 (×3): 40 mg via ORAL
  Filled 2018-11-30 (×3): qty 1

## 2018-11-30 MED ORDER — INSULIN ASPART 100 UNIT/ML ~~LOC~~ SOLN: 0.0000 [IU] | Freq: Every day | SUBCUTANEOUS | Status: DC

## 2018-11-30 MED ORDER — ONDANSETRON HCL 4 MG PO TABS: 4.0000 mg | ORAL_TABLET | Freq: Four times a day (QID) | ORAL | Status: DC | PRN

## 2018-11-30 MED ORDER — ACETAMINOPHEN 650 MG RE SUPP: 650.0000 mg | Freq: Four times a day (QID) | RECTAL | Status: DC | PRN

## 2018-11-30 MED ORDER — POTASSIUM CHLORIDE CRYS ER 20 MEQ PO TBCR
40.0000 meq | EXTENDED_RELEASE_TABLET | Freq: Two times a day (BID) | ORAL | Status: DC
  Administered 2018-11-30 – 2018-12-02 (×6): 40 meq via ORAL
  Filled 2018-11-30 (×6): qty 2

## 2018-11-30 MED ORDER — FUROSEMIDE 10 MG/ML IJ SOLN
20.0000 mg | Freq: Two times a day (BID) | INTRAMUSCULAR | Status: DC
  Administered 2018-11-30 – 2018-12-02 (×5): 20 mg via INTRAVENOUS
  Filled 2018-11-30 (×5): qty 2

## 2018-11-30 MED ORDER — POTASSIUM CHLORIDE 10 MEQ/100ML IV SOLN
10.0000 meq | INTRAVENOUS | Status: AC
  Administered 2018-11-30 (×4): 10 meq via INTRAVENOUS
  Filled 2018-11-30 (×4): qty 100

## 2018-11-30 MED ORDER — ENSURE ENLIVE PO LIQD: 237.0000 mL | Freq: Two times a day (BID) | ORAL | Status: DC

## 2018-11-30 MED ORDER — ROPINIROLE HCL 1 MG PO TABS
0.5000 mg | ORAL_TABLET | Freq: Every day | ORAL | Status: DC
  Administered 2018-11-30 – 2018-12-01 (×2): 0.5 mg via ORAL
  Filled 2018-11-30 (×2): qty 1

## 2018-11-30 NOTE — Care Management Note (Signed)
Case Management Note  Patient Details  Name: Gary Chang MRN: [id number] Date of Birth: [birth year]  Subjective/Objective:    From home with wife.  Admitted with CHF exacerbation.  He is currently on 2L O2 acute.  Receiving IV lasix scheduled 20mg  BID.  Current with PCP.  Denies difficulties obtaining medications or with medical care.  Daughters transport patient and wife as needed. He does have a functioning scale and weighs daily.  He states CHF is fairly new for him.  Patient is on daily weights and I&O's.  Dietary consult placed.  He does have a heart failure appointment scheduled.  Will continue to assist with discharge planning.                Action/Plan:   Expected Discharge Date:                  Expected Discharge Plan:  Home/Self Care  In-House Referral:     Discharge planning Services  CM Consult  Post Acute Care Choice:    Choice offered to:     DME Arranged:    DME Agency:     HH Arranged:    HH Agency:     Status of Service:  In process, will continue to follow  If discussed at Long Length of Stay Meetings, dates discussed:    Additional Comments:  Elza Rafter, RN 11/30/2018, 4:25 PM

## 2018-11-30 NOTE — Progress Notes (Signed)
Pharmacy Electrolyte Monitoring Consult:  Pharmacy consulted to assist in monitoring and replacing electrolytes in this 84 y.o. male admitted on 11/29/2018 with Shortness of Breath   Labs:  Sodium (mmol/L)  Date Value  11/30/2018 137  02/09/2014 132 (L)   Potassium (mmol/L)  Date Value  11/30/2018 3.4 (L)  02/09/2014 2.8 (L)   Magnesium (mg/dL)  Date Value  11/30/2018 2.2   Phosphorus (mg/dL)  Date Value  11/26/2015 3.0   Calcium (mg/dL)  Date Value  11/30/2018 8.3 (L)   Calcium, Total (mg/dL)  Date Value  02/09/2014 8.8   Albumin (g/dL)  Date Value  11/29/2018 3.9  02/09/2014 3.6    Assessment/Plan: 12/10 @ 1800 K 3.4, has KCl 28mEq po BID ordered. No additional supplementation at this time. Will follow up on AM labs.  Paulina Fusi, PharmD, BCPS 11/30/2018 7:33 PM

## 2018-11-30 NOTE — Progress Notes (Signed)
Pharmacy Electrolyte Monitoring Consult:  Pharmacy consulted to assist in monitoring and replacing electrolytes in this 84 y.o. male admitted on 11/29/2018 with Shortness of Breath   Labs:  Sodium (mmol/L)  Date Value  11/29/2018 135  02/09/2014 132 (L)   Potassium (mmol/L)  Date Value  11/29/2018 3.1 (L)  02/09/2014 2.8 (L)   Magnesium (mg/dL)  Date Value  03/29/2018 1.9   Phosphorus (mg/dL)  Date Value  11/26/2015 3.0   Calcium (mg/dL)  Date Value  11/29/2018 8.8 (L)   Calcium, Total (mg/dL)  Date Value  02/09/2014 8.8   Albumin (g/dL)  Date Value  11/29/2018 3.9  02/09/2014 3.6    Assessment/Plan: 12/10 @ 1600 K 3.1 will replace w/ KCl 10 mEq IV x 4 and will recheck electrolytes w/ am labs, stat Mag pending.  Tobie Lords, PharmD, BCPS Clinical Pharmacist 11/30/2018

## 2018-11-30 NOTE — Progress Notes (Signed)
[place] [facility] [place] NAME: Gary Chang    MR#:  [id number]  DATE OF BIRTH:  1928-06-26  SUBJECTIVE:  CHIEF COMPLAINT:   Chief Complaint  Patient presents with  . Shortness of Breath  SOB, very pleasant REVIEW OF SYSTEMS:  Review of Systems  Constitutional: Negative for diaphoresis, fever, malaise/fatigue and weight loss.  HENT: Negative for ear discharge, ear pain, hearing loss, nosebleeds, sore throat and tinnitus.   Eyes: Negative for blurred vision and pain.  Respiratory: Positive for shortness of breath. Negative for cough, hemoptysis and wheezing.   Cardiovascular: Negative for chest pain, palpitations, orthopnea and leg swelling.  Gastrointestinal: Negative for abdominal pain, blood in stool, constipation, diarrhea, heartburn, nausea and vomiting.  Genitourinary: Negative for dysuria, frequency and urgency.  Musculoskeletal: Negative for back pain and myalgias.  Skin: Negative for itching and rash.  Neurological: Negative for dizziness, tingling, tremors, focal weakness, seizures, weakness and headaches.  Psychiatric/Behavioral: Negative for depression. The patient is not nervous/anxious.     DRUG ALLERGIES:   Allergies  Allergen Reactions  . Ezetimibe Other (See Comments)    "Numbness and tingling"  . Statins Other (See Comments)    "Numbness and tingling"   VITALS:  Blood pressure 104/66, pulse 77, temperature 98.2 F (36.8 C), temperature source Oral, resp. rate 19, height 5\' 8"  (1.727 m), weight 97.6 kg, SpO2 99 %. PHYSICAL EXAMINATION:  Physical Exam  Constitutional: He is oriented to person, place, and time.  HENT:  Head: Normocephalic and atraumatic.  Eyes: Pupils are equal, round, and reactive to light. Conjunctivae and EOM are normal.  Neck: Normal range of motion. Neck supple. No tracheal deviation present. No thyromegaly present.  Cardiovascular: Normal rate, regular rhythm and normal heart sounds.    Pulmonary/Chest: Effort normal and breath sounds normal. No respiratory distress. He has no wheezes. He exhibits no tenderness.  Abdominal: Soft. Bowel sounds are normal. He exhibits no distension. There is no tenderness.  Musculoskeletal: Normal range of motion.  Neurological: He is alert and oriented to person, place, and time. No cranial nerve deficit.  Skin: Skin is warm and dry. No rash noted.   LABORATORY PANEL:  Male CBC Recent Labs  Lab 11/30/18 0643  WBC 12.0*  HGB 8.0*  HCT 27.3*  PLT 271   ------------------------------------------------------------------------------------------------------------------ Chemistries  Recent Labs  Lab 11/29/18 1601 11/30/18 0643  NA 135 137  K 3.1* 2.9*  CL 97* 98  CO2 27 27  GLUCOSE 174* 139*  BUN 30* 31*  CREATININE 1.79* 1.77*  CALCIUM 8.8* 8.3*  MG  --  2.2  AST 48*  --   ALT 77*  --   ALKPHOS 89  --   BILITOT 0.9  --    RADIOLOGY:  No results found. ASSESSMENT AND PLAN:   1.  Acute respiratory failure with hypoxia, likely multifactorial, secondary to CHF exacerbation, anemia and pleural effusion.  continue Lasix IV and oxygen.  Continue to monitor clinically closely.  He might require pleural effusion drainage if no improvement.  Continue to monitor hemoglobin level closely and transfuse blood, as needed,for hemoglobin level lower than 7.  2.  Acute CHF exacerbation, see treatment as above under #1.  3.  Chronic atrial fibrillation, rate controlled.  Patient is not on anticoagulation due to recent GI bleed.  4.  Borderline elevated troponin level, no chest pain.  This is likely secondary to demand ischemia from acute CHF exacerbation and atrial fibrillation.  Patient does  not want aggressive interventions.  continue to monitor on telemetry and monitor patient clinically closely.  5.  CKD 3, stable.  Creatinine is at baseline.  Continue to monitor kidney function closely and avoid nephrotoxic medications.  6.   Colon cancer, not on any treatment, per patient's choice.  7.  Hypertension, stable, resume home medications.  8. Hypokalemia: replete and recheck   All the records are reviewed and case discussed with Care Management/Social Worker. Management plans discussed with the patient, family and they are in agreement.  CODE STATUS: DNR  TOTAL TIME TAKING CARE OF THIS PATIENT: 35 minutes.   More than 50% of the time was spent in counseling/coordination of care: YES  POSSIBLE D/C IN 1-2 DAYS, DEPENDING ON CLINICAL CONDITION.   Max Sane M.D on 11/30/2018 at 5:28 PM  Between 7am to 6pm - Pager - [phone]  After 6pm go to [email or web address] - Proofreader  [facility]  Office  [phone]  CC: Primary care physician; [name], MD  Note: This dictation was prepared with Dragon dictation along with smaller phrase technology. Any transcriptional errors that result from this process are unintentional.

## 2018-11-30 NOTE — ED Notes (Signed)
Marcene Brawn, RN from 2A to come to pick up pt for transport.

## 2018-11-30 NOTE — Progress Notes (Signed)
Pt having restless leg, RN last night got a one time order for lyrica ordered for him. Dr. Duane Boston paged & she gave orders for requip. Will give and continue to monitor. Conley Simmonds, RN, BSN

## 2018-12-01 LAB — BASIC METABOLIC PANEL
Anion gap: 9 (ref 5–15)
BUN: 32 mg/dL — ABNORMAL HIGH (ref 8–23)
CO2: 27 mmol/L (ref 22–32)
Calcium: 8.4 mg/dL — ABNORMAL LOW (ref 8.9–10.3)
Chloride: 98 mmol/L (ref 98–111)
Creatinine, Ser: 1.73 mg/dL — ABNORMAL HIGH (ref 0.61–1.24)
GFR calc Af Amer: 40 mL/min — ABNORMAL LOW (ref >60)
GFR calc non Af Amer: 34 mL/min — ABNORMAL LOW (ref >60)
Glucose, Bld: 136 mg/dL — ABNORMAL HIGH (ref 70–99)
Potassium: 3.6 mmol/L (ref 3.5–5.1)
Sodium: 134 mmol/L — ABNORMAL LOW (ref 135–145)

## 2018-12-01 LAB — CBC
HCT: 27.9 % — ABNORMAL LOW (ref 39.0–52.0)
HEMOGLOBIN: 8 g/dL — AB (ref 13.0–17.0)
MCH: 22.3 pg — ABNORMAL LOW (ref 26.0–34.0)
MCHC: 28.7 g/dL — ABNORMAL LOW (ref 30.0–36.0)
MCV: 77.9 fL — ABNORMAL LOW (ref 80.0–100.0)
Platelets: 274 10*3/uL (ref 150–400)
RBC: 3.58 MIL/uL — ABNORMAL LOW (ref 4.22–5.81)
RDW: 16.9 % — ABNORMAL HIGH (ref 11.5–15.5)
WBC: 9.3 10*3/uL (ref 4.0–10.5)
nRBC: 0.2 % (ref 0.0–0.2)

## 2018-12-01 LAB — GLUCOSE, CAPILLARY
Glucose-Capillary: 124 mg/dL — ABNORMAL HIGH (ref 70–99)
Glucose-Capillary: 161 mg/dL — ABNORMAL HIGH (ref 70–99)
Glucose-Capillary: 148 mg/dL — ABNORMAL HIGH (ref 70–99)
Glucose-Capillary: 100 mg/dL — ABNORMAL HIGH (ref 70–99)

## 2018-12-01 MED ORDER — ADULT MULTIVITAMIN W/MINERALS CH
1.0000 | ORAL_TABLET | Freq: Every day | ORAL | Status: DC
  Administered 2018-12-01 – 2018-12-02 (×2): 1 via ORAL
  Filled 2018-12-01: qty 1

## 2018-12-01 MED ORDER — POTASSIUM CHLORIDE CRYS ER 20 MEQ PO TBCR
20.0000 meq | EXTENDED_RELEASE_TABLET | Freq: Once | ORAL | Status: AC
  Administered 2018-12-01: 20 meq via ORAL
  Filled 2018-12-01: qty 1

## 2018-12-01 NOTE — Progress Notes (Signed)
Pharmacy Electrolyte Monitoring Consult:  Pharmacy consulted to assist in monitoring and replacing electrolytes in this 84 y.o. male admitted on 11/29/2018 with Shortness of Breath   Labs:  Sodium (mmol/L)  Date Value  12/01/2018 134 (L)  02/09/2014 132 (L)   Potassium (mmol/L)  Date Value  12/01/2018 3.6  02/09/2014 2.8 (L)   Magnesium (mg/dL)  Date Value  11/30/2018 2.2   Phosphorus (mg/dL)  Date Value  11/26/2015 3.0   Calcium (mg/dL)  Date Value  12/01/2018 8.4 (L)   Calcium, Total (mg/dL)  Date Value  02/09/2014 8.8   Albumin (g/dL)  Date Value  11/29/2018 3.9  02/09/2014 3.6    Assessment/Plan: 12/11 AM K 3.6, has KCl 107mEq po BID ordered and is on Lasix 20mg  IV BID. Will continue with KCl 62mEq PO BID and give an additional KCl 40mEq PO times one at 16:00. Will follow up on AM labs.  Paulina Fusi, PharmD, BCPS 12/01/2018 10:01 AM

## 2018-12-01 NOTE — Plan of Care (Signed)
  Problem: Clinical Measurements: Goal: Respiratory complications will improve Outcome: Progressing Note:  Weaned off 2L O2, on room air now 95%   Problem: Activity: Goal: Risk for activity intolerance will decrease Outcome: Progressing Note:  Up with standby assist, using the urinal   Problem: Nutrition: Goal: Adequate nutrition will be maintained Outcome: Progressing   Problem: Coping: Goal: Level of anxiety will decrease Outcome: Progressing   Problem: Elimination: Goal: Will not experience complications related to urinary retention Outcome: Progressing   Problem: Pain Managment: Goal: General experience of comfort will improve Outcome: Progressing Note:  No complaints of pain this shift   Problem: Safety: Goal: Ability to remain free from injury will improve Outcome: Progressing   Problem: Skin Integrity: Goal: Risk for impaired skin integrity will decrease Outcome: Progressing   Problem: Education: Goal: Knowledge of General Education information will improve Description Including pain rating scale, medication(s)/side effects and non-pharmacologic comfort measures Outcome: Completed/Met

## 2018-12-01 NOTE — Progress Notes (Signed)
Initial Nutrition Assessment  DOCUMENTATION CODES:   Obesity unspecified  INTERVENTION:   Ensure Enlive po BID, each supplement provides 350 kcal and 20 grams of protein  MVI daily  Recommend check iron/anemia labs and supplement as needed   NUTRITION DIAGNOSIS:   Increased nutrient needs related to cancer and cancer related treatments as evidenced by increased estimated needs.  GOAL:   Patient will meet greater than or equal to 90% of their needs  MONITOR:   PO intake, Supplement acceptance, Labs, Weight trends, Skin, I & O's  REASON FOR ASSESSMENT:   Consult Assessment of nutrition requirement/status  ASSESSMENT:   84 y.o. male with a known history of excisional atrial fibrillation, not anticoagulated due to prior GI bleed, history of colon cancer not receiving treatment, COP admitted with CHF    Met with pt in room today. Pt reports good appetite and oral intake today and pta. Pt reports eating 80% of his breakfast this morning which included oatmeal and an omelet. Pt reports he enjoys drinking vanilla Ensure and would like to have this in hospital. RD will also add MVI. Pt with h/o colon mass and iron deficiency anemia. Pt was seen by Dr. Grayland Ormond in April who recommended pt return in 1 and 2 weeks for ferriheme injection; unsure if patient followed up with this. Recommend recheck iron/anemia labs and supplement as needed. Per chart, pt appears weight stable pta. Pt reports no BM in 1 week; per RN last BM was 12/9. Recommend bowel regimen as needed per MD    Medications reviewed and include: colace, lasix, heparin, insulin, Mg oxide, MVI, protonix, KCl, torsemide  Labs reviewed: Na 134(L), BUN 32(H), creat 1.73(H) Hgb 8.0(L), Hct 27.9(L), MCV 77.9(L), MCH 22.3(L), MCHC 28.7(L) cbgs- 124, 161 x 24 hrs Iron 21(L), TIBC 418, ferritin 16(L), B12 1027(H), folate 20.9- 4/8  NUTRITION - FOCUSED PHYSICAL EXAM:    Most Recent Value  Orbital Region  No depletion  Upper Arm  Region  No depletion  Thoracic and Lumbar Region  No depletion  Buccal Region  No depletion  Temple Region  No depletion  Clavicle Bone Region  No depletion  Clavicle and Acromion Bone Region  No depletion  Scapular Bone Region  No depletion  Dorsal Hand  No depletion  Patellar Region  No depletion  Anterior Thigh Region  No depletion  Posterior Calf Region  No depletion  Edema (RD Assessment)  Mild  Hair  Reviewed  Eyes  Reviewed  Mouth  Reviewed  Skin  Reviewed  Nails  Reviewed     Diet Order:   Diet Order            Diet heart healthy/carb modified Room service appropriate? Yes; Fluid consistency: Thin  Diet effective now             EDUCATION NEEDS:   Education needs have been addressed  Skin:  Skin Assessment: Reviewed RN Assessment(ecchymosis )  Last BM:  12/9- pt reports constipation   Height:   Ht Readings from Last 1 Encounters:  11/30/18 _0  (1.727 m)    Weight:   Wt Readings from Last 1 Encounters:  12/01/18 97.5 kg    Ideal Body Weight:  70 kg  BMI:  Body mass index is 32.69 kg/m.  Estimated Nutritional Needs:   Kcal:  2000-2300kcal/day   Protein:  98-108g/day   Fluid:  >1.8L/day   Koleen Distance MS, RD, LDN Pager #- [phone] Office#- [phone] After Hours Pager: [phone]

## 2018-12-01 NOTE — Progress Notes (Signed)
[place] at [place] NAME: Gary Chang    MR#:  [id number]  DATE OF BIRTH:  [birth year]  SUBJECTIVE:  CHIEF COMPLAINT:   Chief Complaint  Patient presents with  . Shortness of Breath  SOB improving. Couldn't sleep well REVIEW OF SYSTEMS:  Review of Systems  Constitutional: Negative for diaphoresis, fever, malaise/fatigue and weight loss.  HENT: Negative for ear discharge, ear pain, hearing loss, nosebleeds, sore throat and tinnitus.   Eyes: Negative for blurred vision and pain.  Respiratory: Positive for shortness of breath. Negative for cough, hemoptysis and wheezing.   Cardiovascular: Negative for chest pain, palpitations, orthopnea and leg swelling.  Gastrointestinal: Negative for abdominal pain, blood in stool, constipation, diarrhea, heartburn, nausea and vomiting.  Genitourinary: Negative for dysuria, frequency and urgency.  Musculoskeletal: Negative for back pain and myalgias.  Skin: Negative for itching and rash.  Neurological: Negative for dizziness, tingling, tremors, focal weakness, seizures, weakness and headaches.  Psychiatric/Behavioral: Negative for depression. The patient is not nervous/anxious.    DRUG ALLERGIES:   Allergies  Allergen Reactions  . Ezetimibe Other (See Comments)    "Numbness and tingling"  . Statins Other (See Comments)    "Numbness and tingling"   VITALS:  Blood pressure (!) 148/90, pulse 91, temperature 98.4 F (36.9 C), temperature source Oral, resp. rate 18, height 5\' 8"  (1.727 m), weight 97.5 kg, SpO2 97 %. PHYSICAL EXAMINATION:  Physical Exam  Constitutional: He is oriented to person, place, and time.  HENT:  Head: Normocephalic and atraumatic.  Eyes: Pupils are equal, round, and reactive to light. Conjunctivae and EOM are normal.  Neck: Normal range of motion. Neck supple. No tracheal deviation present. No thyromegaly present.  Cardiovascular: Normal rate, regular rhythm and normal  heart sounds.  Pulmonary/Chest: Effort normal and breath sounds normal. No respiratory distress. He has no wheezes. He exhibits no tenderness.  Abdominal: Soft. Bowel sounds are normal. He exhibits no distension. There is no tenderness.  Musculoskeletal: Normal range of motion.  Neurological: He is alert and oriented to person, place, and time. No cranial nerve deficit.  Skin: Skin is warm and dry. No rash noted.   LABORATORY PANEL:  Male CBC Recent Labs  Lab 12/01/18 0601  WBC 9.3  HGB 8.0*  HCT 27.9*  PLT 274   ------------------------------------------------------------------------------------------------------------------ Chemistries  Recent Labs  Lab 11/29/18 1601 11/30/18 0643  12/01/18 0601  NA 135 137  --  134*  K 3.1* 2.9*   < > 3.6  CL 97* 98  --  98  CO2 27 27  --  27  GLUCOSE 174* 139*  --  136*  BUN 30* 31*  --  32*  CREATININE 1.79* 1.77*  --  1.73*  CALCIUM 8.8* 8.3*  --  8.4*  MG  --  2.2  --   --   AST 48*  --   --   --   ALT 77*  --   --   --   ALKPHOS 89  --   --   --   BILITOT 0.9  --   --   --    < > = values in this interval not displayed.   RADIOLOGY:  No results found. ASSESSMENT AND PLAN:   1.  Acute respiratory failure with hypoxia, likely multifactorial, secondary to CHF exacerbation, anemia and pleural effusion.  continue Lasix IV and oxygen.   - Neg 1.5 liters, strict I & Os, daily weights -Continue to  monitor hemoglobin level closely and transfuse blood, as needed,for hemoglobin level lower than 7.  2.  Acute CHF exacerbation, see treatment as above under #1.  3.  Chronic atrial fibrillation, rate controlled.  Patient is not on anticoagulation due to recent GI bleed.  4.  Borderline elevated troponin level, no chest pain.  This is likely secondary to demand ischemia from acute CHF exacerbation and atrial fibrillation.  Patient does not want aggressive interventions.  continue to monitor on telemetry   5.  CKD 3, stable.   Creatinine is at baseline.  Continue to monitor kidney function closely and avoid nephrotoxic medications.  6.  Colon cancer, not on any treatment, per patient's choice.  7.  Hypertension, stable, resume home medications.  8. Hypokalemia: repleted and resolved   All the records are reviewed and case discussed with Care Management/Social Worker. Management plans discussed with the patient, nursing and they are in agreement.  CODE STATUS: DNR  TOTAL TIME TAKING CARE OF THIS PATIENT: 35 minutes.   More than 50% of the time was spent in counseling/coordination of care: YES  POSSIBLE D/C IN 1-2 DAYS, DEPENDING ON CLINICAL CONDITION.   Max Sane M.D on 12/01/2018 at 3:45 PM  Between 7am to 6pm - Pager - [phone]  After 6pm go to [email or web address] - Proofreader  [facility]  Office  [phone]  CC: Primary care physician; [name], MD  Note: This dictation was prepared with Dragon dictation along with smaller phrase technology. Any transcriptional errors that result from this process are unintentional.

## 2018-12-02 LAB — CBC
HCT: 29.9 % — ABNORMAL LOW (ref 39.0–52.0)
Hemoglobin: 8.4 g/dL — ABNORMAL LOW (ref 13.0–17.0)
MCH: 22.3 pg — AB (ref 26.0–34.0)
MCHC: 28.1 g/dL — ABNORMAL LOW (ref 30.0–36.0)
MCV: 79.3 fL — ABNORMAL LOW (ref 80.0–100.0)
Platelets: 297 10*3/uL (ref 150–400)
RBC: 3.77 MIL/uL — ABNORMAL LOW (ref 4.22–5.81)
RDW: 16.9 % — ABNORMAL HIGH (ref 11.5–15.5)
WBC: 11.3 10*3/uL — ABNORMAL HIGH (ref 4.0–10.5)
nRBC: 0.2 % (ref 0.0–0.2)

## 2018-12-02 LAB — BASIC METABOLIC PANEL
Anion gap: 10 (ref 5–15)
BUN: 33 mg/dL — ABNORMAL HIGH (ref 8–23)
CO2: 27 mmol/L (ref 22–32)
Calcium: 8.7 mg/dL — ABNORMAL LOW (ref 8.9–10.3)
Chloride: 97 mmol/L — ABNORMAL LOW (ref 98–111)
Creatinine, Ser: 1.78 mg/dL — ABNORMAL HIGH (ref 0.61–1.24)
GFR calc Af Amer: 39 mL/min — ABNORMAL LOW (ref >60)
GFR calc non Af Amer: 33 mL/min — ABNORMAL LOW (ref >60)
GLUCOSE: 183 mg/dL — AB (ref 70–99)
Potassium: 3.3 mmol/L — ABNORMAL LOW (ref 3.5–5.1)
Sodium: 134 mmol/L — ABNORMAL LOW (ref 135–145)

## 2018-12-02 LAB — GLUCOSE, CAPILLARY
GLUCOSE-CAPILLARY: 197 mg/dL — AB (ref 70–99)
Glucose-Capillary: 111 mg/dL — ABNORMAL HIGH (ref 70–99)

## 2018-12-02 LAB — MAGNESIUM: Magnesium: 2.2 mg/dL (ref 1.7–2.4)

## 2018-12-02 MED ORDER — DIPHENHYDRAMINE HCL 25 MG PO CAPS
ORAL_CAPSULE | ORAL | Status: AC
  Administered 2018-12-02: 02:00:00
  Filled 2018-12-02: qty 1

## 2018-12-02 MED ORDER — POTASSIUM CHLORIDE CRYS ER 20 MEQ PO TBCR
20.0000 meq | EXTENDED_RELEASE_TABLET | Freq: Once | ORAL | Status: AC
  Administered 2018-12-02: 20 meq via ORAL
  Filled 2018-12-02: qty 1

## 2018-12-02 MED ORDER — SODIUM CHLORIDE 0.9% FLUSH
3.0000 mL | Freq: Two times a day (BID) | INTRAVENOUS | Status: DC
  Administered 2018-12-02: 3 mL via INTRAVENOUS

## 2018-12-02 NOTE — Discharge Instructions (Signed)
Heart healthy, low sodium and ADA diet.

## 2018-12-02 NOTE — Discharge Summary (Signed)
[place] at [place] NAME: Gary Chang    MR#:  [id number]  DATE OF BIRTH:  July 16, 1928  DATE OF ADMISSION:  11/29/2018   ADMITTING PHYSICIAN: Amelia Jo, MD  DATE OF DISCHARGE: 12/02/2018  PRIMARY CARE PHYSICIAN: [name], MD   ADMISSION DIAGNOSIS:  Elevated troponin [R79.89] Recurrent right pleural effusion [J90] Acute on chronic congestive heart failure, unspecified heart failure type ([place]) [I50.9] DISCHARGE DIAGNOSIS:  Active Problems:   Acute exacerbation of CHF (congestive heart failure) ([place])  SECONDARY DIAGNOSIS:   Past Medical History:  Diagnosis Date  . Abdominal aortic aneurysm (AAA) ([place])   . Allergic state   . Anxiety   . Arthritis   . Cancer ([place])    skin ca removed  . CHF (congestive heart failure) ([place])   . COPD (chronic obstructive pulmonary disease) ([place])   . Depression   . Diabetes mellitus    tingling in feet  . Dyspnea    with exertion  . Enlarged prostate    laser surgery  . GERD (gastroesophageal reflux disease)   . H/O hiatal hernia   . High cholesterol   . Hypertension    pcp  dr Jeneen Rinks [place]  [place]  . Kidney stones   . Melanoma ([place])   . Pneumonia   . Renal artery aneurysm [facility])    HOSPITAL COURSE:   1.Acute respiratory failure with hypoxia,likely multifactorial, secondary to CHF exacerbation, anemia and pleural effusion. He has been treated with Lasix IV. Off oxygen.  Hemoglobin is stable.  2.Acute CHF exacerbation,resume home torsemide 40 mg daily p.o.  3.Chronic atrial fibrillation, rate controlled.  Continue Lopressor.Patient is not on anticoagulation due to recent GI bleed.  4.Borderline elevated troponin level,no chest pain. This is likely secondary to demand ischemia from acute CHF exacerbation and atrial fibrillation. Patient does not want aggressive interventions.continue to monitor on telemetry   5.CKD 3,stable.Creatinine is at  baseline.Continue to monitor kidney function closely and avoid nephrotoxic medications.  6.Colon cancer,not on any treatment,per patient's choice.  7.Hypertension, stable, resume home medications.  8. Hypokalemia: repleted and resolved. k3.3 today, given potassium supplement.  Follow-up BMP as outpatient. Diabetes.  Resume glipizide after discharge. Anemia of chronic disease.  Stable.  Continue iron pill. DISCHARGE CONDITIONS:  Stable, discharge to home today. CONSULTS OBTAINED:   DRUG ALLERGIES:   Allergies  Allergen Reactions  . Ezetimibe Other (See Comments)    "Numbness and tingling"  . Statins Other (See Comments)    "Numbness and tingling"   DISCHARGE MEDICATIONS:   Allergies as of 12/02/2018      Reactions   Ezetimibe Other (See Comments)   "Numbness and tingling"   Statins Other (See Comments)   "Numbness and tingling"      Medication List    STOP taking these medications   acetaminophen 325 MG tablet Commonly known as:  TYLENOL     TAKE these medications   docusate sodium 100 MG capsule Commonly known as:  COLACE Take 2 capsules (200 mg total) by mouth 2 (two) times daily.   feeding supplement (ENSURE ENLIVE) Liqd Take 237 mLs by mouth 2 (two) times daily between meals.   ferrous sulfate 325 (65 FE) MG EC tablet Take 1 tablet (325 mg total) by mouth 2 (two) times daily.   GERITOL PO Take 1 tablet by mouth daily.   glipiZIDE 2.5 MG 24 hr tablet Commonly known as:  GLUCOTROL XL Take 2.5 mg by mouth at bedtime.  hydrALAZINE 100 MG tablet Commonly known as:  APRESOLINE Take 100 mg by mouth 3 (three) times daily.   HYDROcodone-acetaminophen 7.5-325 MG tablet Commonly known as:  NORCO Take 1 tablet by mouth 2 (two) times daily as needed.   losartan 100 MG tablet Commonly known as:  COZAAR Take 100 mg by mouth every evening.   magnesium oxide 400 MG tablet Commonly known as:  MAG-OX Take 400 mg by mouth every evening.   metoprolol  succinate 100 MG 24 hr tablet Commonly known as:  TOPROL-XL Take 100 mg by mouth daily.   multivitamin with minerals Tabs tablet Take 1 tablet by mouth daily.   pantoprazole 40 MG tablet Commonly known as:  PROTONIX Take 1 tablet (40 mg total) by mouth daily.   potassium chloride SA 20 MEQ tablet Commonly known as:  K-DUR,KLOR-CON Take 40 mEq by mouth 2 (two) times daily.   torsemide 20 MG tablet Commonly known as:  DEMADEX Take 40 mg by mouth daily.        DISCHARGE INSTRUCTIONS:  See AVS.  If you experience worsening of your admission symptoms, develop shortness of breath, life threatening emergency, suicidal or homicidal thoughts you must seek medical attention immediately by calling 911 or calling your MD immediately  if symptoms less severe.  You Must read complete instructions/literature along with all the possible adverse reactions/side effects for all the Medicines you take and that have been prescribed to you. Take any new Medicines after you have completely understood and accpet all the possible adverse reactions/side effects.   Please note  You were cared for by a hospitalist during your hospital stay. If you have any questions about your discharge medications or the care you received while you were in the hospital after you are discharged, you can call the unit and asked to speak with the hospitalist on call if the hospitalist that took care of you is not available. Once you are discharged, your primary care physician will handle any further medical issues. Please note that NO REFILLS for any discharge medications will be authorized once you are discharged, as it is imperative that you return to your primary care physician (or establish a relationship with a primary care physician if you do not have one) for your aftercare needs so that they can reassess your need for medications and monitor your lab values.    On the day of Discharge:  VITAL SIGNS:  Blood pressure  114/71, pulse 88, temperature 97.7 F (36.5 C), temperature source Oral, resp. rate 14, height 5\' 8"  (1.727 m), weight 97.6 kg, SpO2 95 %. PHYSICAL EXAMINATION:  GENERAL:  84 y.o.-year-old patient lying in the bed with no acute distress.  EYES: Pupils equal, round, reactive to light and accommodation. No scleral icterus. Extraocular muscles intact.  HEENT: Head atraumatic, normocephalic. Oropharynx and nasopharynx clear.  NECK:  Supple, no jugular venous distention. No thyroid enlargement, no tenderness.  LUNGS: Normal breath sounds bilaterally, no wheezing, rales,rhonchi or crepitation. No use of accessory muscles of respiration.  CARDIOVASCULAR: S1, S2 normal. No murmurs, rubs, or gallops.  ABDOMEN: Soft, non-tender, non-distended. Bowel sounds present. No organomegaly or mass.  EXTREMITIES: No pedal edema, cyanosis, or clubbing.  NEUROLOGIC: Cranial nerves II through XII are intact. Muscle strength 5/5 in all extremities. Sensation intact. Gait not checked.  PSYCHIATRIC: The patient is alert and oriented x 3.  SKIN: No obvious rash, lesion, or ulcer.  DATA REVIEW:   CBC Recent Labs  Lab 12/02/18 0406  WBC 11.3*  HGB 8.4*  HCT 29.9*  PLT 297    Chemistries  Recent Labs  Lab 11/29/18 1601  12/02/18 0406  NA 135   < > 134*  K 3.1*   < > 3.3*  CL 97*   < > 97*  CO2 27   < > 27  GLUCOSE 174*   < > 183*  BUN 30*   < > 33*  CREATININE 1.79*   < > 1.78*  CALCIUM 8.8*   < > 8.7*  MG  --    < > 2.2  AST 48*  --   --   ALT 77*  --   --   ALKPHOS 89  --   --   BILITOT 0.9  --   --    < > = values in this interval not displayed.     Microbiology Results  Results for orders placed or performed during the hospital encounter of 12/12/17  Culture, blood (routine x 2)     Status: None   Collection Time: 12/12/17  7:59 PM  Result Value Ref Range Status   Specimen Description BLOOD BLOOD LEFT HAND  Final   Special Requests   Final    BOTTLES DRAWN AEROBIC AND ANAEROBIC Blood  Culture adequate volume   Culture   Final    NO GROWTH 5 DAYS Performed at [facility], [address] [place], [place] [address]    Report Status 12/17/2017 FINAL  Final  Culture, blood (routine x 2)     Status: None   Collection Time: 12/12/17  7:59 PM  Result Value Ref Range Status   Specimen Description BLOOD BLOOD RIGHT FOREARM  Final   Special Requests   Final    BOTTLES DRAWN AEROBIC AND ANAEROBIC Blood Culture results may not be optimal due to an excessive volume of blood received in culture bottles   Culture   Final    NO GROWTH 5 DAYS Performed at [facility], [address], [place], [place] [address]    Report Status 12/17/2017 FINAL  Final    RADIOLOGY:  No results found.   Management plans discussed with the patient, family and they are in agreement.  CODE STATUS: DNR   TOTAL TIME TAKING CARE OF THIS PATIENT: 33 minutes.    Demetrios Loll M.D on 12/02/2018 at 2:10 PM  Between 7am to 6pm - Pager - [phone]  After 6pm go to [email or web address] - Proofreader  [facility]  Office  [phone]  CC: Primary care physician; [name], MD   Note: This dictation was prepared with Dragon dictation along with smaller phrase technology. Any transcriptional errors that result from this process are unintentional.

## 2018-12-02 NOTE — Progress Notes (Signed)
Please note patient is currently followed by outpatient Palliative at home. CMRN Geoffry Paradise made aware.  Flo Shanks RN, BSN, [facility] [place], hospital liaison [phone]

## 2018-12-02 NOTE — Care Management Important Message (Signed)
Copy of signed IM left with patient in room.  

## 2018-12-02 NOTE — Progress Notes (Addendum)
Pharmacy Electrolyte Monitoring Consult:  Pharmacy consulted to assist in monitoring and replacing electrolytes in this 84 y.o. male admitted on 11/29/2018 with Shortness of Breath   Labs:  Sodium (mmol/L)  Date Value  12/02/2018 134 (L)  02/09/2014 132 (L)   Potassium (mmol/L)  Date Value  12/02/2018 3.3 (L)  02/09/2014 2.8 (L)   Magnesium (mg/dL)  Date Value  12/02/2018 2.2   Phosphorus (mg/dL)  Date Value  11/26/2015 3.0   Calcium (mg/dL)  Date Value  12/02/2018 8.7 (L)   Calcium, Total (mg/dL)  Date Value  02/09/2014 8.8   Albumin (g/dL)  Date Value  11/29/2018 3.9  02/09/2014 3.6    Assessment/Plan: 12/12 AM K 3.3, Mag 2.2  Scr 1.78 has KCl 29mEq po BID ordered and is on Lasix 20mg  IV BID and Torsemide 40 daily, and Mag-Ox 400mg  Qpm.  Will continue with KCl 51mEq PO BID and give an additional KCl 34mEq PO times one at 12:00.  Will follow up on AM labs.  Chinita Greenland PharmD Clinical Pharmacist 12/02/2018

## 2018-12-02 NOTE — Progress Notes (Addendum)
Cardiovascular and Pulmonary Nurse Navigator Note:   84 year old male who was admitted with acute respiratory failure with hypoxia, likely multifactorial, secondary to CHF exacerbation, anemia, and pleural effusion.  Patient has been receiving IV Lasix and noted to also be on oral torsemide.  Discussed with Dr. Bridgett Larsson. Patient being discharged today on home dose of torsemide.  Patient has hx of CHF, GERD, COPD, AAA, HTN, HLD, colon cancer (no treatment per patient's choice) and afib, rate controlled.  Patient is not on anticoagulation due to recent GI Bleed. Borderline elevated troponin felt to be secondary to demand ischemia.   BNP 712 on admission.  Last echo was performed on 02/10/2018 EF was 60-65%.    Rounded on patient to review CHF Education:  ? Educational session with patient completed.  Provided declined "Living Better with Heart Failure" packet, as patient stated he has 2 - 3 of those at home.  . Briefly reviewed definition of heart failure and signs and symptoms of an exacerbation.?Explained to patient that HF is a chronic illness which requires self-assessment / self-management along with help from the cardiologist/PCP.  Dr. Nehemiah Massed is patient's primary cardiologist.   ? *Reviewed importance of and reason behind checking weight daily in the AM, after using the bathroom, but before getting dressed. Patient has scales and reports that he weighs daily.    ? *Reviewed with patient the following information: *Discussed when to call the Dr= weight gain of >2-3lb overnight of 5lb in a week,  *Discussed yellow zone= call MD: weight gain of >2-3lb overnight of 5lb in a week, increased swelling, increased SOB when lying down, chest discomfort, dizziness, increased fatigue *Red Zone= call 911: struggle to breath, fainting or near fainting, significant chest pain   *Diet - Reviewed low sodium diet-provided handout of recommended and not recommended foods. Patient reported that he does not like salt.     ? *Discussed fluid intake with patient as well. Patient not currently on a fluid restriction, but advised no more than 8-8 ounces glass of fluids per day. Demonstrated the amount using the bedside water pitcher.   ? *Instructed patient to take medications as prescribed for heart failure. Explained briefly why pt is on the medications (either make you feel better, live longer or keep you out of the hospital) and discussed monitoring and side effects. Assigned RN to review home medications and times when next doses are due prior to discharge.   ? *Discussed exercise. Patient informed this RN that he uses a walker to ambulate around his home.  Patient stated, "I do pretty good with my walker."   Encouraged patient to be as active as possible.   Patient requesting an assessment to see if he qualifies for home oxygen. Patient stated, "I feel like if I need oxygen to use at home because I get so short of breath when I am trying to do things." Patient currently does not have home oxygen.  Discussed with patient's assigned RN, Bethel Born. Jancy RN to perform home oxygen assessment.  RNCM also made aware of patient's request.  Awaiting home oxygen assessment.   ? *Smoking Cessation- Patient is a former smoker.? ? *[facility] - Patient is an established patient in the [facility].  Patient's follow-up appointment in the HF Clinic is scheduled for 12/24/2018 at 1:00 p.m.  Reminded patient the HF Clinic does not replace PCP nor Cardiologist, but is an additional resource to help patient manage his heart failure at home.  Patient has follow-up appointments with PCP on 12/06/2018 and Dr. Nehemiah Massed, Cardiologist, on 12/09/2018.   ? Again, the 5 Steps to Living Better with Heart Failure were reviewed with patient.  Patient thanked me for providing the above information. ? ? Roanna Epley, RN, BSN, [facility]? [facility]  Cardiovascular &?Pulmonary Nurse  Navigator  Direct Line: [phone]  Department Phone #: [phone] Fax: [phone]? Email Address: [email or web address]

## 2018-12-02 NOTE — Progress Notes (Signed)
SATURATION QUALIFICATIONS: (This note is used to comply with regulatory documentation for home oxygen)  Patient Saturations on Room Air at Rest = 100%  Patient Saturations on Room Air while Ambulating = 93 - 94%  Please briefly explain why patient needs home oxygen:  Although he gets SOB with exertion his O2 sats stay within normal limits.  Instructed him to rest periodically during activity, especially when he begins to feel short of breath.  Discharged home with his daughter.  No changes to medications.  Follow up appointments made.Marland Kitchen

## 2018-12-06 ENCOUNTER — Other Ambulatory Visit: Payer: Self-pay

## 2018-12-06 DIAGNOSIS — I5033 Acute on chronic diastolic (congestive) heart failure: Secondary | ICD-10-CM | POA: Diagnosis not present

## 2018-12-06 DIAGNOSIS — I10 Essential (primary) hypertension: Secondary | ICD-10-CM | POA: Diagnosis not present

## 2018-12-06 DIAGNOSIS — R0602 Shortness of breath: Secondary | ICD-10-CM | POA: Diagnosis not present

## 2018-12-06 DIAGNOSIS — G2581 Restless legs syndrome: Secondary | ICD-10-CM | POA: Diagnosis not present

## 2018-12-06 NOTE — Patient Outreach (Signed)
Unsuccessful telephone call to Mr. Nations today.  I left a HIPPA compliant voicemail on the home phone.    Plan: Joetta Manners, PharmD, will follow-up with Mr. Bodmer in 3-4 business days regarding post discharge medication review.   Isaias Sakai, [place] D PGY1 Pharmacy Resident  12/06/2018      12:01 PM

## 2018-12-08 ENCOUNTER — Ambulatory Visit: Payer: PPO | Admitting: Family

## 2018-12-08 ENCOUNTER — Ambulatory Visit: Payer: Self-pay

## 2018-12-09 ENCOUNTER — Ambulatory Visit: Payer: Self-pay

## 2018-12-09 ENCOUNTER — Other Ambulatory Visit: Payer: Self-pay

## 2018-12-09 DIAGNOSIS — I4819 Other persistent atrial fibrillation: Secondary | ICD-10-CM | POA: Diagnosis not present

## 2018-12-09 DIAGNOSIS — R7989 Other specified abnormal findings of blood chemistry: Secondary | ICD-10-CM | POA: Diagnosis not present

## 2018-12-09 DIAGNOSIS — I10 Essential (primary) hypertension: Secondary | ICD-10-CM | POA: Diagnosis not present

## 2018-12-09 DIAGNOSIS — D649 Anemia, unspecified: Secondary | ICD-10-CM | POA: Diagnosis not present

## 2018-12-09 DIAGNOSIS — I5033 Acute on chronic diastolic (congestive) heart failure: Secondary | ICD-10-CM | POA: Diagnosis not present

## 2018-12-09 NOTE — Patient Outreach (Signed)
[facility]) Care Management  [place]  12/09/2018  Gary Chang 15-Jun-1928 [id number]  Reason for call: 30 day post discharge medication review  Unsuccessful telephone call attempt # 2 to patient.   HIPAA compliant voicemail left requesting a return call.  Plan:  I will make another outreach attempt to patient within 3-4 business days.  Joetta Manners, PharmD Clinical Pharmacist [place] [phone]

## 2018-12-14 ENCOUNTER — Other Ambulatory Visit: Payer: Self-pay

## 2018-12-14 ENCOUNTER — Ambulatory Visit: Payer: Self-pay

## 2018-12-14 NOTE — Patient Outreach (Addendum)
[facility]) Care Management  [place]  12/14/2018  Gary Chang August 25, 1928 [id number]   Reason for call: 30 day post discharge medication review  Unsuccessful telephone call attempt # 3  to patient.   HIPAA compliant voicemail left requesting a return call.  Plan:  Will make no further attempts to complete medication review at this time.   Joetta Manners, PharmD Clinical Pharmacist [place] [phone]

## 2018-12-23 DIAGNOSIS — F329 Major depressive disorder, single episode, unspecified: Secondary | ICD-10-CM | POA: Diagnosis not present

## 2018-12-23 DIAGNOSIS — D509 Iron deficiency anemia, unspecified: Secondary | ICD-10-CM | POA: Diagnosis not present

## 2018-12-23 DIAGNOSIS — I959 Hypotension, unspecified: Secondary | ICD-10-CM | POA: Diagnosis not present

## 2018-12-24 ENCOUNTER — Ambulatory Visit: Payer: PPO | Admitting: Family

## 2019-01-20 DIAGNOSIS — Z125 Encounter for screening for malignant neoplasm of prostate: Secondary | ICD-10-CM | POA: Diagnosis not present

## 2019-01-20 DIAGNOSIS — D649 Anemia, unspecified: Secondary | ICD-10-CM | POA: Diagnosis not present

## 2019-01-20 DIAGNOSIS — I10 Essential (primary) hypertension: Secondary | ICD-10-CM | POA: Diagnosis not present

## 2019-01-20 DIAGNOSIS — E876 Hypokalemia: Secondary | ICD-10-CM | POA: Diagnosis not present

## 2019-01-20 DIAGNOSIS — E119 Type 2 diabetes mellitus without complications: Secondary | ICD-10-CM | POA: Diagnosis not present

## 2019-01-24 DIAGNOSIS — R350 Frequency of micturition: Secondary | ICD-10-CM | POA: Diagnosis not present

## 2019-01-24 DIAGNOSIS — Z0000 Encounter for general adult medical examination without abnormal findings: Secondary | ICD-10-CM | POA: Diagnosis not present

## 2019-01-24 DIAGNOSIS — R2689 Other abnormalities of gait and mobility: Secondary | ICD-10-CM | POA: Diagnosis not present

## 2019-01-24 DIAGNOSIS — F329 Major depressive disorder, single episode, unspecified: Secondary | ICD-10-CM | POA: Diagnosis not present

## 2019-01-24 DIAGNOSIS — E119 Type 2 diabetes mellitus without complications: Secondary | ICD-10-CM | POA: Diagnosis not present

## 2019-01-24 DIAGNOSIS — N183 Chronic kidney disease, stage 3 (moderate): Secondary | ICD-10-CM | POA: Diagnosis not present

## 2019-01-24 DIAGNOSIS — R413 Other amnesia: Secondary | ICD-10-CM | POA: Diagnosis not present

## 2019-01-24 DIAGNOSIS — I4819 Other persistent atrial fibrillation: Secondary | ICD-10-CM | POA: Diagnosis not present

## 2019-01-24 DIAGNOSIS — R109 Unspecified abdominal pain: Secondary | ICD-10-CM | POA: Diagnosis not present

## 2019-01-24 DIAGNOSIS — D509 Iron deficiency anemia, unspecified: Secondary | ICD-10-CM | POA: Diagnosis not present

## 2019-04-12 ENCOUNTER — Telehealth: Payer: Self-pay

## 2019-04-12 NOTE — Telephone Encounter (Signed)
Call to patient and daughter Gary Chang to schedule follow up palliative care visits.  No answer at either contact number, messages left .

## 2019-04-28 DIAGNOSIS — D649 Anemia, unspecified: Secondary | ICD-10-CM | POA: Diagnosis not present

## 2019-04-28 DIAGNOSIS — R634 Abnormal weight loss: Secondary | ICD-10-CM | POA: Diagnosis not present

## 2019-04-28 DIAGNOSIS — R1030 Lower abdominal pain, unspecified: Secondary | ICD-10-CM | POA: Diagnosis not present

## 2019-08-04 ENCOUNTER — Telehealth: Payer: Self-pay | Admitting: Student

## 2019-08-04 NOTE — Telephone Encounter (Signed)
Palliative NP left message to check on patient and offered follow up visit; awaiting return call.

## 2019-08-16 DIAGNOSIS — R0602 Shortness of breath: Secondary | ICD-10-CM | POA: Diagnosis not present

## 2019-08-16 DIAGNOSIS — H6121 Impacted cerumen, right ear: Secondary | ICD-10-CM | POA: Diagnosis not present

## 2019-08-16 DIAGNOSIS — D649 Anemia, unspecified: Secondary | ICD-10-CM | POA: Diagnosis not present

## 2019-08-16 DIAGNOSIS — R42 Dizziness and giddiness: Secondary | ICD-10-CM | POA: Diagnosis not present

## 2019-08-30 DIAGNOSIS — R7989 Other specified abnormal findings of blood chemistry: Secondary | ICD-10-CM | POA: Diagnosis not present

## 2019-09-19 ENCOUNTER — Telehealth: Payer: Self-pay | Admitting: Adult Health Nurse Practitioner

## 2019-09-19 NOTE — Telephone Encounter (Signed)
Called patient to set up appointment.  Left VM with contact info Amy K. Daniels NP 

## 2019-09-19 NOTE — Telephone Encounter (Signed)
Patient's daughter called after I left VM about scheduling follow up appointment.  Stated that her father had passed away this past 04/18/23.  Offered my condolences and offered bereavement services.  She declined bereavement services at this time. Tahir Blank K. Olena Heckle NP

## 2019-09-22 DEATH — deceased
